# Patient Record
Sex: Female | Born: 1960
Health system: Southern US, Community
[De-identification: ages and names within clinical notes are randomized; demographics above are authoritative.]

## PROBLEM LIST (undated history)

## (undated) DIAGNOSIS — R51 Headache: Secondary | ICD-10-CM

## (undated) DIAGNOSIS — J189 Pneumonia, unspecified organism: Secondary | ICD-10-CM

## (undated) DIAGNOSIS — A159 Respiratory tuberculosis unspecified: Secondary | ICD-10-CM

## (undated) DIAGNOSIS — T7840XA Allergy, unspecified, initial encounter: Secondary | ICD-10-CM

## (undated) DIAGNOSIS — D126 Benign neoplasm of colon, unspecified: Secondary | ICD-10-CM

## (undated) DIAGNOSIS — R002 Palpitations: Secondary | ICD-10-CM

## (undated) DIAGNOSIS — K219 Gastro-esophageal reflux disease without esophagitis: Secondary | ICD-10-CM

## (undated) DIAGNOSIS — J45909 Unspecified asthma, uncomplicated: Secondary | ICD-10-CM

## (undated) HISTORY — PX: OVARIAN CYST REMOVAL: SHX89

## (undated) HISTORY — DX: Gastro-esophageal reflux disease without esophagitis: K21.9

## (undated) HISTORY — DX: Palpitations: R00.2

## (undated) HISTORY — DX: Respiratory tuberculosis unspecified: A15.9

## (undated) HISTORY — DX: Unspecified asthma, uncomplicated: J45.909

## (undated) HISTORY — PX: APPENDECTOMY: SHX54

## (undated) HISTORY — DX: Headache: R51

## (undated) HISTORY — PX: POLYPECTOMY: SHX149

## (undated) HISTORY — PX: COLONOSCOPY: SHX174

## (undated) HISTORY — DX: Benign neoplasm of colon, unspecified: D12.6

## (undated) HISTORY — DX: Allergy, unspecified, initial encounter: T78.40XA

## (undated) HISTORY — DX: Pneumonia, unspecified organism: J18.9

---

## 2003-06-26 ENCOUNTER — Other Ambulatory Visit: Admission: RE | Admit: 2003-06-26 | Discharge: 2003-06-26 | Payer: Self-pay | Admitting: Obstetrics and Gynecology

## 2004-06-20 ENCOUNTER — Emergency Department (HOSPITAL_COMMUNITY): Admission: EM | Admit: 2004-06-20 | Discharge: 2004-06-20 | Payer: Self-pay | Admitting: Emergency Medicine

## 2004-06-30 ENCOUNTER — Other Ambulatory Visit: Admission: RE | Admit: 2004-06-30 | Discharge: 2004-06-30 | Payer: Self-pay | Admitting: Obstetrics and Gynecology

## 2005-11-06 ENCOUNTER — Emergency Department (HOSPITAL_COMMUNITY): Admission: EM | Admit: 2005-11-06 | Discharge: 2005-11-06 | Payer: Self-pay | Admitting: Family Medicine

## 2006-11-15 ENCOUNTER — Ambulatory Visit: Payer: Self-pay | Admitting: Cardiology

## 2006-11-15 ENCOUNTER — Observation Stay (HOSPITAL_COMMUNITY): Admission: EM | Admit: 2006-11-15 | Discharge: 2006-11-16 | Payer: Self-pay | Admitting: Emergency Medicine

## 2008-10-09 ENCOUNTER — Encounter: Admission: RE | Admit: 2008-10-09 | Discharge: 2008-10-09 | Payer: Self-pay | Admitting: Obstetrics and Gynecology

## 2008-10-09 ENCOUNTER — Encounter (INDEPENDENT_AMBULATORY_CARE_PROVIDER_SITE_OTHER): Payer: Self-pay | Admitting: *Deleted

## 2009-06-30 ENCOUNTER — Encounter (INDEPENDENT_AMBULATORY_CARE_PROVIDER_SITE_OTHER): Payer: Self-pay | Admitting: *Deleted

## 2009-07-19 ENCOUNTER — Encounter: Payer: Self-pay | Admitting: Cardiology

## 2009-07-28 ENCOUNTER — Ambulatory Visit: Payer: Self-pay | Admitting: Gastroenterology

## 2009-07-28 DIAGNOSIS — J45909 Unspecified asthma, uncomplicated: Secondary | ICD-10-CM | POA: Insufficient documentation

## 2009-07-28 DIAGNOSIS — K5900 Constipation, unspecified: Secondary | ICD-10-CM | POA: Insufficient documentation

## 2009-07-28 DIAGNOSIS — K5909 Other constipation: Secondary | ICD-10-CM | POA: Insufficient documentation

## 2009-08-11 ENCOUNTER — Telehealth: Payer: Self-pay | Admitting: Gastroenterology

## 2009-08-31 ENCOUNTER — Ambulatory Visit: Payer: Self-pay | Admitting: Gastroenterology

## 2009-08-31 HISTORY — PX: COLONOSCOPY: SHX174

## 2009-11-05 ENCOUNTER — Telehealth: Payer: Self-pay | Admitting: Gastroenterology

## 2010-04-05 NOTE — Assessment & Plan Note (Signed)
Summary: constipation...em   History of Present Illness Visit Type: new patient  Primary GI MD: Melvia Heaps MD [facility] Primary Provider: Miguel Aschoff, MD  Requesting Provider: na  Chief Complaint: constipation History of Present Illness:   [name] is a pleasant 50 year old Hispanic female referred at the request of Dr. Tenny Craw for evaluation of constipation.  This has been an intermittent problem in the past.  It is worsening.  She may go 3-4 days without a bowel movement.  She has occasional lower abdominall pain.  She is without bleeding or melena.  Weight has been stable.  She is on a high fiber diet.   GI Review of Systems      Denies abdominal pain, acid reflux, belching, bloating, chest pain, dysphagia with liquids, dysphagia with solids, heartburn, loss of appetite, nausea, vomiting, vomiting blood, weight loss, and  weight gain.      Reports constipation.     Denies anal fissure, black tarry stools, change in bowel habit, diarrhea, diverticulosis, fecal incontinence, heme positive stool, hemorrhoids, irritable bowel syndrome, jaundice, light color stool, liver problems, rectal bleeding, and  rectal pain.    Current Medications (verified): 1)  Butalbital-Apap-Caffeine 50-325-40 Mg Tabs (Butalbital-Apap-Caffeine) .... Take One To Two Tablets By Mouth Every Six Hours As Needed For Pain 2)  Ibuprofen 200 Mg Tabs (Ibuprofen) .... As Needed 3)  Osteoporosis Support Therapy Pack Misc (Nutritional Supplements) .... Once Daily  Allergies (verified): No Known Drug Allergies  Past History:  Past Medical History: Asthma Pneumonia Chronic Headaches  Past Surgical History: Ovarian Cyst   Family History: No FH of Colon Cancer: Family History of Heart Disease: Father Family History of Clotting disorder: Father  Social History: Occupation: Conservation officer, nature Married No childern Patient has never smoked.  Alcohol Use - no Daily Caffeine Use: 1 daily Illicit Drug Use - no Smoking Status:   never Drug Use:  no  Review of Systems       The patient complains of change in vision, headaches-new, sleeping problems, and vision changes.  The patient denies allergy/sinus, anemia, anxiety-new, arthritis/joint pain, back pain, blood in urine, breast changes/lumps, confusion, cough, coughing up blood, depression-new, fainting, fatigue, fever, hearing problems, heart murmur, heart rhythm changes, itching, menstrual pain, muscle pains/cramps, night sweats, nosebleeds, pregnancy symptoms, shortness of breath, skin rash, sore throat, swelling of feet/legs, swollen lymph glands, thirst - excessive , urination - excessive , urination changes/pain, urine leakage, and voice change.         All other systems were reviewed and were negative   Vital Signs:  Patient profile:   50 year old patient Height:      62 inches Weight:      115 pounds BMI:     21.11 BSA:     1.51 Pulse rate:   60 / minute Pulse rhythm:   regular BP sitting:   94 / 52  (left arm) Cuff size:   regular  Vitals Entered By: Ok Anis CMA (Jul 28, 2009 11:04 AM)  Physical Exam  Additional Exam:  On physical exam she is a well-developed well-nourished female  skin: anicteric HEENT: normocephalic; PEERLA; no nasal or pharyngeal abnormalities neck: supple nodes: no cervical lymphadenopathy chest: clear to ausculatation and percussion heart: no murmurs, gallops, or rubs abd: soft, nontender; BS normoactive; no abdominal masses, tenderness, organomegaly rectal: deferred ext: no cynanosis, clubbing, edema skeletal: no deformities neuro: oriented x 3; no focal abnormalities    Impression & Recommendations:  Problem # 1:  OTHER CONSTIPATION (ICD-564.09) Constipation  is most likely functional.  Recommendations #1 fiber supplementation #2 screening colonoscopy  Risks, alternatives, and complications of the procedure, including bleeding, perforation, and possible need for surgery, were explained to the patient.   Patient's questions were answered.  Problem # 2:  ASTHMA, UNSPECIFIED, UNSPECIFIED STATUS (ICD-493.90) Assessment: Comment Only  Other Orders: Colonoscopy (Colon)  Patient Instructions: 1)  CC Dr. Donovan Kail 2)  Come for your procedure on 08/31/2009 on the 4th floor of the [facility].  3)  Your Moviprep has been sent to your pharm.  4)  Constipation and Hemorrhoids brochure given.  5)  Colonoscopy and Flexible Sigmoidoscopy brochure given.  6)  Conscious Sedation brochure given.  7)  The medication list was reviewed and reconciled.  All changed / newly prescribed medications were explained.  A complete medication list was provided to the patient / caregiver. Prescriptions: MOVIPREP 100 GM  SOLR (PEG-KCL-NACL-NASULF-NA ASC-C) As per prep instructions.  #1 x 0   Entered by:   Harlow Mares CMA (AAMA)   Authorized by:   Louis Meckel MD   Signed by:   Harlow Mares CMA (AAMA) on 07/28/2009   Method used:   Electronically to        [facility] [address] (retail)       [phone]       [place], [place]  [address]       Ph: [phone]       Fax: [phone]   RxID:   [id number] MOVIPREP 100 GM  SOLR (PEG-KCL-NACL-NASULF-NA ASC-C) As per prep instructions.  #1 x 0   Entered by:   Harlow Mares CMA (AAMA)   Authorized by:   Louis Meckel MD   Signed by:   Harlow Mares CMA (AAMA) on 07/28/2009   Method used:   Electronically to        [facility]  [address] [facility] [phone]* (retail)       [address]       [place], [place]  [address]       Ph: [phone] or [phone]       Fax: [phone]   RxID:   [id number]

## 2010-04-05 NOTE — Letter (Signed)
Summary: Results Letter  [facility]  [address] [place], [place] [address]   Phone: [phone]  Fax: [phone]        Jul 28, 2009 MRN: [id number]    [name] [address] [place], [place]  [address]    Dear [name],  It is my pleasure to have treated you recently as a new patient in my office. I appreciate your confidence and the opportunity to participate in your care.  Since I do have a busy inpatient endoscopy schedule and office schedule, my office hours vary weekly. I am, however, available for emergency calls everyday through my office. If I am not available for an urgent office appointment, another one of our gastroenterologist will be able to assist you.  My well-trained staff are prepared to help you at all times. For emergencies after office hours, a physician from our Gastroenterology section is always available through my 24 hour answering service  Once again I welcome you as a new patient and I look forward to a happy and healthy relationship             Sincerely,  Louis Meckel MD  This letter has been electronically signed by your physician.  Appended Document: Results Letter letter mailed

## 2010-04-05 NOTE — Procedures (Signed)
Summary: Colonoscopy  Patient: [name] Note: All result statuses are Final unless otherwise noted.  Tests: (1) Colonoscopy (COL)   COL Colonoscopy           DONE     [facility]     [address]     [place], [place]  [address]           COLONOSCOPY PROCEDURE REPORT           PATIENT:  [name], [name]  MR#:  [id number]     BIRTHDATE:  1961/02/20, 49 yrs. old  GENDER:  female           ENDOSCOPIST:  Barbette Hair. Arlyce Dice, MD     Referred by:           PROCEDURE DATE:  08/31/2009     PROCEDURE:  Diagnostic Colonoscopy     ASA CLASS:  Class I     INDICATIONS:  1) constipation           MEDICATIONS:   Fentanyl 50 mcg IV, Versed 6 mg IV           DESCRIPTION OF PROCEDURE:   After the risks benefits and     alternatives of the procedure were thoroughly explained, informed     consent was obtained.  Digital rectal exam was performed and     revealed no abnormalities.   The LB [id number]-[id number] endoscope     was introduced through the anus and advanced to the cecum, which     was identified by the ileocecal valve, without limitations.  The     quality of the prep was excellent, using MoviPrep.  The instrument     was then slowly withdrawn as the colon was fully examined.     <<PROCEDUREIMAGES>>           FINDINGS:  Diverticula were found in the transverse colon (see     image7). Few diverticula  This was otherwise a normal examination     of the colon (see image3, image4, image6, image8, image10,     image11, image14, and image15).   Retroflexed views in the rectum     revealed no abnormalities.    The time to cecum =  3.25  minutes.     The scope was then withdrawn (time =  6.0  min) from the patient     and the procedure completed.           COMPLICATIONS:  None           ENDOSCOPIC IMPRESSION:     1) Diverticula in the transverse colon     2) Otherwise normal examination     RECOMMENDATIONS:     1) high fiber diet; miralax as needed every 3 days     2) call  office next 1-3 days to schedule followup visit in 1     month           REPEAT EXAM:  In 10 year(s) for Colonoscopy.           ______________________________     Barbette Hair. Arlyce Dice, MD           CC: Duane Lope, MD           n.     Rosalie Doctor:   Barbette Hair. Kaenan Jake at 08/31/2009 02:17 PM           Burak, Douglas, [id number]  Note: An exclamation mark (!) indicates a result that was not  dispersed into the flowsheet. Document Creation Date: 08/31/2009 2:17 PM _______________________________________________________________________  (1) Order result status: Final Collection or observation date-time: 08/31/2009 14:10 Requested date-time:  Receipt date-time:  Reported date-time:  Referring Physician:   Ordering Physician: Melvia Heaps [phone]) Specimen Source:  Source: Launa Grill Order Number: [id number] Lab site:   Appended Document: Colonoscopy     Procedures Next Due Date:    Colonoscopy: 08/2016

## 2010-04-05 NOTE — Letter (Signed)
Summary: [facility] [place] Instructions  [facility]  [address] [place], [place] [address]   Phone: [phone]  Fax: [phone]       [name]    Oct 01, 1960    MRN: [id number]        Procedure Day /Date: 08/31/2009 Tuesday     Arrival Time: 12:30pm     Procedure Time: 1:30pm     Location of Procedure:                    [facility] (4th Floor)   PREPARATION FOR COLONOSCOPY WITH MOVIPREP   Starting 5 days prior to your procedure 08/27/2009 do not eat nuts, seeds, popcorn, corn, beans, peas,  salads, or any raw vegetables.  Do not take any fiber supplements (e.g. Metamucil, Citrucel, and Benefiber).  THE DAY BEFORE YOUR PROCEDURE         DATE: 08/30/2009  DAY: Monday  1.  Drink clear liquids the entire day-NO SOLID FOOD  2.  Do not drink anything colored red or purple.  Avoid juices with pulp.  No orange juice.  3.  Drink at least 64 oz. (8 glasses) of fluid/clear liquids during the day to prevent dehydration and help the prep work efficiently.  CLEAR LIQUIDS INCLUDE: Water Jello Ice Popsicles Tea (sugar ok, no milk/cream) Powdered fruit flavored drinks Coffee (sugar ok, no milk/cream) Gatorade Juice: apple, white grape, white cranberry  Lemonade Clear bullion, consomm, broth Carbonated beverages (any kind) Strained chicken noodle soup Hard Candy                             4.  In the morning, mix first dose of MoviPrep solution:    Empty 1 Pouch A and 1 Pouch B into the disposable container    Add lukewarm drinking water to the top line of the container. Mix to dissolve    Refrigerate (mixed solution should be used within 24 hrs)  5.  Begin drinking the prep at 5:00 p.m. The MoviPrep container is divided by 4 marks.   Every 15 minutes drink the solution down to the next mark (approximately 8 oz) until the full liter is complete.   6.  Follow completed prep with 16 oz of clear liquid of your choice (Nothing red or purple).   Continue to drink clear liquids until bedtime.  7.  Before going to bed, mix second dose of MoviPrep solution:    Empty 1 Pouch A and 1 Pouch B into the disposable container    Add lukewarm drinking water to the top line of the container. Mix to dissolve    Refrigerate  THE DAY OF YOUR PROCEDURE      DATE:  08/31/2009  DAY: Tuesday  Beginning at 8:30a.m. (5 hours before procedure):         1. Every 15 minutes, drink the solution down to the next mark (approx 8 oz) until the full liter is complete.  2. Follow completed prep with 16 oz. of clear liquid of your choice.    3. You may drink clear liquids until 11:30am (2 HOURS BEFORE PROCEDURE).   MEDICATION INSTRUCTIONS  Unless otherwise instructed, you should take regular prescription medications with a small sip of water   as early as possible the morning of your procedure.          OTHER INSTRUCTIONS  You will need a responsible adult at least 50 years of  age to accompany you and drive you home.   This person must remain in the waiting room during your procedure.  Wear loose fitting clothing that is easily removed.  Leave jewelry and other valuables at home.  However, you may wish to bring a book to read or  an iPod/MP3 player to listen to music as you wait for your procedure to start.  Remove all body piercing jewelry and leave at home.  Total time from sign-in until discharge is approximately 2-3 hours.  You should go home directly after your procedure and rest.  You can resume normal activities the  day after your procedure.  The day of your procedure you should not:   Drive   Make legal decisions   Operate machinery   Drink alcohol   Return to work  You will receive specific instructions about eating, activities and medications before you leave.    The above instructions have been reviewed and explained to me by   _______________________    I fully understand and can verbalize these instructions  _____________________________ Date _________

## 2010-04-05 NOTE — Progress Notes (Signed)
Summary: Triage-REV  Phone Note Call from Patient Call back at [phone]   Caller: spouse ronnie Call For: Dr Arlyce Dice Reason for Call: Talk to Nurse Summary of Call: Husband cx wife appt for Tues. and was wanting to know if he has to reschedule or can she just f.u as needed. Initial call taken by: Tawni Levy,  November 05, 2009 1:23 PM  Follow-up for Phone Call        Message left: Per Dr.Talib Headley/Colonoscopy report on 08-31-09, pt. was to follow-up with Dr.Jazminn Pomales in the office, in 1 month. Please call [phone] to reschedule appt.  Follow-up by: Laureen Ochs LPN,  November 05, 2009 2:10 PM

## 2010-04-05 NOTE — Progress Notes (Signed)
Summary: Rescheduled colon  Phone Note Call from Patient   Caller: Patient Call For: Dr. Arlyce Dice Summary of Call: Pt r/s her COL from 08-13-09 to 08-31-09 due to conflict w/work sch'd and cannot get off. Would you like pt. charged the cancelation fee? Initial call taken by: Karna Christmas,  August 11, 2009 9:45 AM  Follow-up for Phone Call        no Follow-up by: Louis Meckel MD,  August 11, 2009 10:52 AM

## 2010-04-05 NOTE — Letter (Signed)
Summary: New Patient letter  [facility]  [address] [place], [place] [address]   Phone: [phone]  Fax: [phone]       06/30/2009 MRN: [id number]  [name] [address] [place], [place]  [address]  Dear [name],  Welcome to the Gastroenterology Division at [facility].    You are scheduled to see Dr.  Stan Head on August 04, 2009 at 1:45pm on the 3rd floor at [facility], [address]  We ask that you try to arrive at our office 15 minutes prior to your appointment time to allow for check-in.  We would like you to complete the enclosed self-administered evaluation form prior to your visit and bring it with you on the day of your appointment.  We will review it with you.  Also, please bring a complete list of all your medications or, if you prefer, bring the medication bottles and we will list them.  Please bring your insurance card so that we may make a copy of it.  If your insurance requires a referral to see a specialist, please bring your referral form from your primary care physician.  Co-payments are due at the time of your visit and may be paid by cash, check or credit card.     Your office visit will consist of a consult with your physician (includes a physical exam), any laboratory testing he/she may order, scheduling of any necessary diagnostic testing (e.g. x-ray, ultrasound, CT-scan), and scheduling of a procedure (e.g. Endoscopy, Colonoscopy) if required.  Please allow enough time on your schedule to allow for any/all of these possibilities.    If you cannot keep your appointment, please call [phone] to cancel or reschedule prior to your appointment date.  This allows [place] the opportunity to schedule an appointment for another patient in need of care.  If you do not cancel or reschedule by 5 p.m. the business day prior to your appointment date, you will be charged a $50.00 late cancellation/no-show fee.    Thank you for  choosing [facility] for your medical needs.  We appreciate the opportunity to care for you.  Please visit [place] at our website  to learn more about our practice.                     Sincerely,                                                             The Gastroenterology Division

## 2010-07-19 NOTE — H&P (Signed)
[name], [name]               ACCOUNT NO.:  [id number]   MEDICAL RECORD NO.:  [id number]          PATIENT TYPE:  EMS   LOCATION:  [facility]                         FACILITY:  [facility]   PHYSICIAN:  Hollice Espy, M.D.DATE OF BIRTH:  05/28/1960   DATE OF ADMISSION:  11/15/2006  DATE OF DISCHARGE:                              HISTORY & PHYSICAL   PRIMARY CARE [place]:  Corky Mull, PA.   GYNECOLOGIST:  Miguel Aschoff, M.D.   REASON FOR ADMISSION:  Chest pain.   HISTORY OF PRESENT ILLNESS:  [name] is a delightful 50 year old  woman from [place] with a history of previous tuberculosis with  extensive right upper lobe scarring.  Otherwise, her past medical  history is unremarkable except for headaches.  She denies any previous  history of known coronary artery disease.  She has never had a stress  test or cardiac catheterization.  There is no history of hypertension,  hyperlipidemia, diabetes, or tobacco use.  She presents today with a 3-  day history of chest pain.  She said this began Tuesday, when she had  some pain in her left arm while visiting her in-laws.  This radiated to  her chest.  It lasted about an hour with no associated symptoms.  Yesterday she felt weak, she had occasional chest pain.  She felt it was  slightly harder to take a breath.  But this did not effect her exercise  tolerance in any way.  This morning again she woke up with a headache  and she again had some mild chest pain without any associated symptoms.  Once again, it was not worse with exertion.  She feels better now.  She  has planned on going to [place] for a month next week.   In the emergency room, her EKG is normal without any evidence of ST-T  wave changes.  Point-of-care markers are negative x1.   REVIEW OF SYSTEMS:  As baseline she is very active without any  exertional symptoms.  She has had a mild cough but this has been  nonproductive.  No fevers, no chills, no heart failure, no bright red  blood per rectum, no melena.  Perimenopausal.  The remainder of the  review of systems is negative except for HPI and problem list.   PROBLEM LIST:  1. Headaches.  2. History of a previous pulmonary tuberculosis with extensive right      upper lobe scarring which is stable.   CURRENT MEDICATIONS:  Prilosec 20 a day.   ALLERGIES:  No known drug allergies.   SOCIAL HISTORY:  She lives with her husband.  No kids.  She is  originally from [place].  She currently works as a Conservation officer, nature.  She  denies any tobacco or alcohol.  She is perimenopausal.   FAMILY HISTORY:  Mother is 47, has a history of hypertension.  Father is  13 with a history of arthritis.  There is no family history of premature  heart disease.   PHYSICAL EXAMINATION:  GENERAL:  She is lying flat in bed in no acute  distress.  Respirations are  unlabored.  VITAL SIGNS:  Blood pressure is 102/66, heart rate is 80.  She is sating  at 95% on room air.  Temperature is 99.3.  HEENT:  Normal.  NECK:  Supple.  There is no JVD.  Carotids are 2 plus bilaterally  without any bruits.  There is no lymphadenopathy or thyromegaly.  CARDIAC:  PMI is nondisplaced.  She has a regular rate and rhythm with  no murmurs, rubs, or gallops.  LUNGS:  Clear.  There are some mild crackles in the right upper lobe.  ABDOMEN:  Soft, nontender, nondistended.  There is no  hepatosplenomegaly.  No bruits.  No masses.  Good bowel sounds.  EXTREMITIES:  Warm with no cyanosis, clubbing, or edema.  There are no  cords.  Distal pulses are strong.  No rash.  NEUROLOGIC:  Alert and oriented x3.  Cranial nerves II-XII are intact.  She moves all 4 extremities without difficulty.  Affect is very  pleasant.  Oriented x3.   EKG shows a normal sinus rhythm, rate of 61, normal axis and intervals.  There are no ST-T wave abnormalities.  Cardiac markers, 2 sets actually,  have a CK-MB of less than 1 and troponin of less than 0.05.  A D-dimer  is less than 0.22.   I-STAT shows a hemoglobin of 13.9.  Sodium 130,  potassium 3.4, chloride 107, glucose 88, BUN 10, creatinine is 0.6.   ASSESSMENT:  1. Atypical chest pain.  2. History of pulmonary tuberculosis.   PLAN/DISCUSSION:  Her chest pain is very atypical.  I doubt this is  ischemia.  We will plan a 23-hour observation on telemetry to rule out  myocardial infarction.  I would suggest a treadmill Myoview to further  evaluate and given the fact that she is going out of the country next  week, it may be best just to do it prior to her discharge.      Bevelyn Buckles. Bensimhon, MD  Electronically Signed      Hollice Espy, M.D.  Electronically Signed    DRB/MEDQ  D:  11/15/2006  T:  11/15/2006  Job:  [id number]   cc:   Corky Mull, PA  Miguel Aschoff, M.D.

## 2010-12-16 LAB — COMPREHENSIVE METABOLIC PANEL
AST: 15
Alkaline Phosphatase: 65
BUN: 9
CO2: 25
Calcium: 8.3 — ABNORMAL LOW
Chloride: 108
GFR calc Af Amer: >60
Sodium: 136
Total Protein: 6.1

## 2010-12-16 LAB — I-STAT 8, (EC8 V) (CONVERTED LAB)
Acid-base deficit: 2
Bicarbonate: 25.7 — ABNORMAL HIGH
Glucose, Bld: 88
HCT: 41
Potassium: 3.4 — ABNORMAL LOW
TCO2: 27

## 2010-12-16 LAB — CARDIAC PANEL(CRET KIN+CKTOT+MB+TROPI)
CK, MB: 0.8
Relative Index: INVALID
Total CK: 46
Troponin I: 0.01
Troponin I: 0.04

## 2010-12-16 LAB — CBC
HCT: 33.7 — ABNORMAL LOW
Hemoglobin: 11.4 — ABNORMAL LOW
MCHC: 33.8
MCV: 91.1
Platelets: 243
RDW: 12.6

## 2010-12-16 LAB — POCT I-STAT CREATININE
Creatinine, Ser: 0.6
Operator id: 294501

## 2010-12-16 LAB — PROTIME-INR: Prothrombin Time: 12.8

## 2010-12-16 LAB — POCT PREGNANCY, URINE: Preg Test, Ur: NEGATIVE

## 2010-12-16 LAB — CK TOTAL AND CKMB (NOT AT ARMC)
Relative Index: INVALID
Total CK: 44

## 2010-12-16 LAB — LIPID PANEL
Cholesterol: 166
HDL: 49
Triglycerides: 80
VLDL: 16

## 2010-12-16 LAB — POCT CARDIAC MARKERS
Myoglobin, poc: 42.6
Operator id: 294501
Operator id: 294501
Troponin i, poc: <0.05

## 2010-12-16 LAB — TSH: TSH: 1.618

## 2011-04-07 ENCOUNTER — Emergency Department (HOSPITAL_COMMUNITY)
Admission: EM | Admit: 2011-04-07 | Discharge: 2011-04-07 | Disposition: A | Discharge status: Home / Self Care | Payer: 59 | Attending: Emergency Medicine | Admitting: Emergency Medicine

## 2011-04-07 ENCOUNTER — Encounter (HOSPITAL_COMMUNITY): Payer: Self-pay | Admitting: Emergency Medicine

## 2011-04-07 ENCOUNTER — Emergency Department (HOSPITAL_COMMUNITY): Payer: 59

## 2011-04-07 DIAGNOSIS — M79609 Pain in unspecified limb: Secondary | ICD-10-CM | POA: Insufficient documentation

## 2011-04-07 DIAGNOSIS — L819 Disorder of pigmentation, unspecified: Secondary | ICD-10-CM

## 2011-04-07 DIAGNOSIS — M7989 Other specified soft tissue disorders: Secondary | ICD-10-CM | POA: Insufficient documentation

## 2011-04-07 DIAGNOSIS — M25449 Effusion, unspecified hand: Secondary | ICD-10-CM | POA: Insufficient documentation

## 2011-04-07 LAB — DIFFERENTIAL
Basophils Relative: 1 % (ref 0–1)
Eosinophils Absolute: 0.8 10*3/uL — ABNORMAL HIGH (ref 0.0–0.7)
Eosinophils Relative: 11 % — ABNORMAL HIGH (ref 0–5)
Monocytes Relative: 6 % (ref 3–12)
Neutrophils Relative %: 47 % (ref 43–77)

## 2011-04-07 LAB — CBC
MCH: 30.6 pg (ref 26.0–34.0)
MCHC: 34.3 g/dL (ref 30.0–36.0)
MCV: 89.4 fL (ref 78.0–100.0)
Platelets: 231 10*3/uL (ref 150–400)

## 2011-04-07 LAB — PROTIME-INR: Prothrombin Time: 12.5 seconds (ref 11.6–15.2)

## 2011-04-07 LAB — APTT: aPTT: 29 seconds (ref 24–37)

## 2011-04-07 NOTE — ED Notes (Signed)
PT. REPORTS RIGHT THUMB PAIN  WITH SWELLING ONSET YESTERDAY , DENIES INJURY.

## 2011-04-07 NOTE — ED Provider Notes (Signed)
Medical screening examination/treatment/procedure(s) were performed by non-physician practitioner and as supervising physician I was immediately available for consultation/collaboration.  Jasmine Awe, MD 04/07/11 [phone]

## 2011-04-07 NOTE — ED Provider Notes (Signed)
History     CSN: [id number]  Arrival date & time 04/07/11  4098   First MD Initiated Contact with Patient 04/07/11 0127      Chief Complaint  Patient presents with  . Finger Injury    (Consider location/radiation/quality/duration/timing/severity/associated sxs/prior treatment) HPI Comments: Patient here with husband who states that the patient was cleaning the floor yesterday and when she took off her gloves she noticed that her right thumb was swollen and now turning purple - states no pain and no known injury - today the swelling is decreased but the discoloration is still present - continues with no pain to the area, no other fingers with swelling or discoloration.  Patient is a 51 y.o. female presenting with hand pain. The history is provided by the patient and the spouse. No language interpreter was used.  Hand Pain This is a new problem. The current episode started yesterday. The problem has been unchanged. Associated symptoms include joint swelling. Pertinent negatives include no abdominal pain, anorexia, arthralgias, change in bowel habit, chest pain, congestion, coughing, diaphoresis, fatigue, fever, headaches, myalgias, nausea, neck pain, numbness, rash, sore throat, vertigo, visual change, vomiting or weakness. The symptoms are aggravated by nothing. She has tried nothing for the symptoms. The treatment provided no relief.    History reviewed. No pertinent past medical history.  Past Surgical History  Procedure Date  . Cesarean section     No family history on file.  History  Substance Use Topics  . Smoking status: Never Smoker   . Smokeless tobacco: Not on file  . Alcohol Use: No    OB History    Grav Para Term Preterm Abortions TAB SAB Ect Mult Living                  Review of Systems  Constitutional: Negative for fever, diaphoresis and fatigue.  HENT: Negative for congestion, sore throat and neck pain.   Respiratory: Negative for cough.   Cardiovascular:  Negative for chest pain.  Gastrointestinal: Negative for nausea, vomiting, abdominal pain, anorexia and change in bowel habit.  Musculoskeletal: Positive for joint swelling. Negative for myalgias and arthralgias.  Skin: Negative for rash.  Neurological: Negative for vertigo, weakness, numbness and headaches.  All other systems reviewed and are negative.    Allergies  Review of patient's allergies indicates no known allergies.  Home Medications   Current Outpatient Rx  Name Route Sig Dispense Refill  . OMEGA-3 FATTY ACIDS 1000 MG PO CAPS Oral Take 1 g by mouth daily.    . IBUPROFEN 200 MG PO TABS Oral Take 400 mg by mouth once as needed. For headaches.      BP 100/61  Pulse 65  Temp(Src) 97.7 F (36.5 C) (Oral)  Resp 20  SpO2 100%  Physical Exam  Nursing note and vitals reviewed. Constitutional: She is oriented to person, place, and time. She appears well-developed and well-nourished. No distress.  HENT:  Head: Normocephalic and atraumatic.  Right Ear: External ear normal.  Left Ear: External ear normal.  Nose: Nose normal.  Mouth/Throat: Oropharynx is clear and moist. No oropharyngeal exudate.  Eyes: Conjunctivae are normal. Pupils are equal, round, and reactive to light. No scleral icterus.  Neck: Normal range of motion. Neck supple.  Cardiovascular: Normal rate, regular rhythm and normal heart sounds.  Exam reveals no gallop and no friction rub.   No murmur heard. Pulmonary/Chest: Effort normal and breath sounds normal. No respiratory distress. She exhibits no tenderness.  Abdominal: Soft. Bowel  sounds are normal. She exhibits no distension.  Musculoskeletal: Normal range of motion. She exhibits no edema and no tenderness.  Lymphadenopathy:    She has no cervical adenopathy.  Neurological: She is alert and oriented to person, place, and time. No cranial nerve deficit.  Skin: Skin is warm and dry.       Mild discoloration of the entire right thumb which has the  appearance of a bruise, no tenderness to palpation.  Psychiatric: She has a normal mood and affect. Her behavior is normal. Judgment and thought content normal.    ED Course  Procedures (including critical care time)  Labs Reviewed  CBC - Abnormal; Notable for the following:    RBC 3.85 (*)    Hemoglobin 11.8 (*)    HCT 34.4 (*)    All other components within normal limits  DIFFERENTIAL - Abnormal; Notable for the following:    Eosinophils Relative 11 (*)    Eosinophils Absolute 0.8 (*)    All other components within normal limits  APTT  PROTIME-INR   Dg Finger Thumb Right  04/07/2011  *RADIOLOGY REPORT*  Clinical Data: Right thumb injury  RIGHT THUMB 2+V  Comparison: None  Findings: Osseous mineralization normal. Joint spaces preserved. No acute fracture, dislocation or bone destruction.  IMPRESSION: Normal exam.  Original Report Authenticated By: Lollie Marrow, M.D.     1. Discoloration of skin       MDM  Patient with no tenderness and edema of the finger so I do not suspect cellulitis, felon or paronychia, x-ray is negative for fracture and PT, PTT and platelets are all normal.  I also doubt Raynaulds but this could be a possibility, I have encouraged the patient to follow up with their PCP for further evaluation if the discoloration continues.        Izola Price [place], [place] 04/07/11 [phone]

## 2011-06-08 ENCOUNTER — Encounter: Payer: Self-pay | Admitting: *Deleted

## 2011-09-26 ENCOUNTER — Other Ambulatory Visit: Payer: Self-pay | Admitting: Obstetrics and Gynecology

## 2011-09-26 DIAGNOSIS — R928 Other abnormal and inconclusive findings on diagnostic imaging of breast: Secondary | ICD-10-CM

## 2011-10-04 ENCOUNTER — Ambulatory Visit
Admission: RE | Admit: 2011-10-04 | Discharge: 2011-10-04 | Disposition: A | Discharge status: Home / Self Care | Payer: 59 | Source: Ambulatory Visit | Attending: Obstetrics and Gynecology | Admitting: Obstetrics and Gynecology

## 2011-10-04 ENCOUNTER — Other Ambulatory Visit: Payer: 59

## 2011-10-04 DIAGNOSIS — R928 Other abnormal and inconclusive findings on diagnostic imaging of breast: Secondary | ICD-10-CM

## 2012-09-30 ENCOUNTER — Other Ambulatory Visit: Payer: Self-pay | Admitting: Obstetrics and Gynecology

## 2012-09-30 DIAGNOSIS — R928 Other abnormal and inconclusive findings on diagnostic imaging of breast: Secondary | ICD-10-CM

## 2012-10-17 ENCOUNTER — Ambulatory Visit
Admission: RE | Admit: 2012-10-17 | Discharge: 2012-10-17 | Disposition: A | Discharge status: Home / Self Care | Payer: 59 | Source: Ambulatory Visit | Attending: Obstetrics and Gynecology | Admitting: Obstetrics and Gynecology

## 2012-10-17 DIAGNOSIS — R928 Other abnormal and inconclusive findings on diagnostic imaging of breast: Secondary | ICD-10-CM

## 2012-12-13 ENCOUNTER — Other Ambulatory Visit: Payer: Self-pay | Admitting: Orthopaedic Surgery

## 2012-12-13 DIAGNOSIS — M5030 Other cervical disc degeneration, unspecified cervical region: Secondary | ICD-10-CM

## 2012-12-13 DIAGNOSIS — M542 Cervicalgia: Secondary | ICD-10-CM

## 2012-12-20 ENCOUNTER — Ambulatory Visit
Admission: RE | Admit: 2012-12-20 | Discharge: 2012-12-20 | Disposition: A | Discharge status: Home / Self Care | Payer: 59 | Source: Ambulatory Visit | Attending: Orthopaedic Surgery | Admitting: Orthopaedic Surgery

## 2012-12-20 DIAGNOSIS — M542 Cervicalgia: Secondary | ICD-10-CM

## 2012-12-20 DIAGNOSIS — M5030 Other cervical disc degeneration, unspecified cervical region: Secondary | ICD-10-CM

## 2012-12-31 ENCOUNTER — Ambulatory Visit (INDEPENDENT_AMBULATORY_CARE_PROVIDER_SITE_OTHER): Payer: 59 | Admitting: Emergency Medicine

## 2012-12-31 VITALS — BP 98/60 | HR 60 | Temp 97.6°F | Resp 18 | Ht 60.0 in | Wt 116.0 lb

## 2012-12-31 DIAGNOSIS — Z0000 Encounter for general adult medical examination without abnormal findings: Secondary | ICD-10-CM

## 2012-12-31 DIAGNOSIS — G44209 Tension-type headache, unspecified, not intractable: Secondary | ICD-10-CM

## 2012-12-31 LAB — COMPREHENSIVE METABOLIC PANEL
ALT: 9 U/L (ref 0–35)
AST: 16 U/L (ref 0–37)
Albumin: 4.3 g/dL (ref 3.5–5.2)
Alkaline Phosphatase: 79 U/L (ref 39–117)
BUN: 13 mg/dL (ref 6–23)
Calcium: 9.3 mg/dL (ref 8.4–10.5)
Chloride: 106 mEq/L (ref 96–112)
Potassium: 4.3 mEq/L (ref 3.5–5.3)
Sodium: 139 mEq/L (ref 135–145)

## 2012-12-31 LAB — POCT CBC
Granulocyte percent: 49.4 %G (ref 37–80)
HCT, POC: 39.9 % (ref 37.7–47.9)
Lymph, poc: 2.1 (ref 0.6–3.4)
MCHC: 31.1 g/dL — AB (ref 31.8–35.4)
MPV: 9.4 fL (ref 0–99.8)
POC Granulocyte: 2.5 (ref 2–6.9)
POC LYMPH PERCENT: 41.1 %L (ref 10–50)
POC MID %: 9.5 %M (ref 0–12)
RDW, POC: 13.4 %

## 2012-12-31 LAB — POCT URINALYSIS DIPSTICK
Bilirubin, UA: NEGATIVE
Glucose, UA: NEGATIVE
Nitrite, UA: NEGATIVE
Urobilinogen, UA: 0.2

## 2012-12-31 LAB — POCT UA - MICROSCOPIC ONLY
Casts, Ur, LPF, POC: NEGATIVE
Mucus, UA: NEGATIVE
Yeast, UA: NEGATIVE

## 2012-12-31 LAB — LIPID PANEL
HDL: 64 mg/dL (ref >39)
LDL Cholesterol: 139 mg/dL — ABNORMAL HIGH (ref 0–99)

## 2012-12-31 LAB — IFOBT (OCCULT BLOOD): IFOBT: NEGATIVE

## 2012-12-31 LAB — VITAMIN D 25 HYDROXY (VIT D DEFICIENCY, FRACTURES): Vit D, 25-Hydroxy: 22 ng/mL — ABNORMAL LOW (ref 30–89)

## 2012-12-31 LAB — TSH: TSH: 2.035 u[IU]/mL (ref 0.350–4.500)

## 2012-12-31 MED ORDER — BUTALBITAL-APAP-CAFFEINE 50-325-40 MG PO TABS
1.0000 | ORAL_TABLET | Freq: Four times a day (QID) | ORAL | Status: AC | PRN
Start: 2012-12-31 — End: 2013-12-31

## 2012-12-31 NOTE — Patient Instructions (Signed)
Constipación - Adulto   (Constipation, Adult)   Constipación significa que una persona tiene menos de 3 evacuaciones en una semana, hay dificultad para evacuar el intestino, o las heces son secas, duras, o más grandes que lo normal. A medida que envejecemos el estreñimiento es más común. Si intenta curar el estreñimiento con medicamentos que producen la evacuación intestinal (laxantes), el problema puede empeorar. El uso prolongado de laxantes puede hacer que los músculos del colon se debiliten. Una dieta baja en fibra, no tomar suficientes líquidos y el uso de ciertos medicamentos pueden empeorar el estreñimiento.   CAUSAS   · Ciertos medicamentos, como los antidepresivos, analgésicos, suplementos de hierro, antiácidos y diuréticos.    · Algunas enfermedades, como la diabetes, el síndrome del colon irritable (SII), enfermedad de la tiroides, o depresión.    · No beber suficiente agua.    · No consumir suficientes alimentos ricos en fibra.    · Situaciones de estrés o viajes.  · Falta de actividad física o de ejercicio.  · No ir al baño cuando siente la necesidad.  · Ignorar la necesidad súbita de mover el intestino.  · Uso en exceso de laxantes.  SÍNTOMAS   · Evacuar el intestino menos de 3 veces a la semana.    · Dificultad para mover el intestino    · Tener las heces secas y duras, o más grandes que las normales.    · Sensación de estar lleno o distendido.    · Dolor en la parte baja del abdomen  · No se siente alivio después de evacuar el intestino.  DIAGNÓSTICO   El médico le hará una historia clínica y le hará un examen físico. Pueden hacerle exámenes adicionales para el estreñimiento grave. Algunas pruebas son:   · Un radiografía con enema de bario para examinar el recto, el colon y en algunos casos el intestino delgado.  · Una sigmoidoscopia para examinar el colon inferior.  · Una colonoscopia para examinar todo el colon.  TRATAMIENTO   El tratamiento dependerá de la gravedad de la constipación y de la  causa. Algunos tratamientos dietéticos son beber más líquidos y comer más alimentos ricos en fibra. El cambio en el estilo de vida incluye hacer ejercicios de manera regular. Si estas recomendaciones para realizar cambios en la dieta y en el estilo de vida no ayudan, el médico le puede indicar el uso de laxantes de venta libre para favorecer el movimiento intestinal. Los medicamentos con receta se pueden prescribir si los medicamentos de venta libre no lo mejoran.   INSTRUCCIONES PARA EL CUIDADO EN EL HOGAR   · Aumente el consumo de alimentos con fibra, como frutas, verduras, granos enteros y frijoles. Limite los azúcares ricos en grasas y procesados   en su dieta, tales como papas fritas, hamburguesas, galletas, dulces y refrescos.    · Puede agregar un suplemento de fibra a su dieta si no obtiene lo suficiente de los alimentos.    · Debe ingerir gran cantidad de líquido para mantener la orina de tono claro o color amarillo pálido.    · Haga ejercicios regularmente o según las indicaciones de su médico.    · Vaya al baño cuando sienta la necesidad de ir. No espere.  · Tome sólo la medicación que le indicó el profesional.  No tome otros medicamentos para la constipación sin consultar a su médico.  SOLICITE ATENCIÓN MÉDICA DE INMEDIATO SI:   · Observa sangre brillante en las heces.    · La constipación dura más de 4 días o empeora.    · Siente   dolor abdominal o rectal.    · Las heces son delgadas como un lápiz.  · Pierde peso de manera inexplicable.  ASEGÚRESE DE QUE:   · Comprende estas instrucciones.  · Controlará su enfermedad.  · Solicitará ayuda de inmediato si no mejora o empeora.  Document Released: 03/12/2007 Document Revised: 08/22/2011  [employer]® Patient Information ©2014 [employer].

## 2012-12-31 NOTE — Progress Notes (Signed)
[facility] [address], [place] [address] [phone]  Date:  12/31/2012   Name:  [name]   DOB:  1960/11/03   MRN:  [id number]  PCP:  Katy Apo, MD    Chief Complaint: Annual Exam   History of Present Illness:  [name] is a 52 y.o. very pleasant female patient who presents with the following:  Had GYN and mammogram in July.  Had colonoscopy 4 years ago.  Was normal except for diverticulosis.  Has chronic constipation.  No blood in stool or stool change.  Frequent headaches that arise in the back of her neck and over the top of her head.  No neuro or visual symptoms.    No improvement with over the counter medications or other home remedies. Denies other complaint or health concern today.   Patient Active Problem List   Diagnosis Date Noted  . ASTHMA, UNSPECIFIED, UNSPECIFIED STATUS 07/28/2009  . CONSTIPATION 07/28/2009  . OTHER CONSTIPATION 07/28/2009    Past Medical History  Diagnosis Date  . Palpitations   . Headache(784.0)   . Asthma     Past Surgical History  Procedure Laterality Date  . Cesarean section    . Ovarian cyst removal    . Polypectomy      History  Substance Use Topics  . Smoking status: Never Smoker   . Smokeless tobacco: Not on file  . Alcohol Use: 0.0 oz/week     Comment: occasional    Family History  Problem Relation Age of Onset  . Heart disease    . Hypertension    . Hyperlipidemia    . Hypertension Mother   . Diabetes Paternal Grandfather     Allergies  Allergen Reactions  . Gabapentin     Caused headaches and dizziness   . Meloxicam     Caused headaches and dizziness    Medication list has been reviewed and updated.  Current Outpatient Prescriptions on File Prior to Visit  Medication Sig Dispense Refill  . fish oil-omega-3 fatty acids 1000 MG capsule Take 1 g by mouth daily.      Marland Kitchen ibuprofen (ADVIL,MOTRIN) 200 MG tablet Take 400 mg by mouth once as needed. For headaches.       . Multiple Vitamin (MULTIVITAMIN) tablet Take 1 tablet by mouth daily.       No current facility-administered medications on file prior to visit.    Review of Systems:  As per HPI, otherwise negative.    Physical Examination: Filed Vitals:   12/31/12 0927  BP: 98/60  Pulse: 60  Temp: 97.6 F (36.4 C)  Resp: 18   Filed Vitals:   12/31/12 0927  Height: 5' (1.524 m)  Weight: 116 lb (52.617 kg)   Body mass index is 22.65 kg/(m^2). Ideal Body Weight: Weight in (lb) to have BMI = 25: 127.7  GEN: WDWN, NAD, Non-toxic, A & O x 3 HEENT: Atraumatic, Normocephalic. Neck supple. No masses, No LAD. Ears and Nose: No external deformity. CV: RRR, No M/G/R. No JVD. No thrill. No extra heart sounds. PULM: CTA B, no wheezes, crackles, rhonchi. No retractions. No resp. distress. No accessory muscle use. ABD: S, NT, ND, +BS. No rebound. No HSM. EXTR: No c/c/e NEURO Normal gait.  PSYCH: Normally interactive. Conversant. Not depressed or anxious appearing.  Calm demeanor.  DRE:  normal  Assessment and Plan: Wellness examination Tension headaches  Signed,  Phillips Odor, MD

## 2012-12-31 NOTE — Addendum Note (Signed)
Addended by: Vira Agar on: 12/31/2012 10:25 AM   Modules accepted: Orders

## 2013-01-09 ENCOUNTER — Other Ambulatory Visit: Payer: Self-pay

## 2013-01-20 ENCOUNTER — Telehealth: Payer: Self-pay

## 2013-01-21 NOTE — Progress Notes (Signed)
Completed biometric wellness form for pt and called husband as requested to notify ready for p/up. Copy scanned.

## 2014-02-17 ENCOUNTER — Ambulatory Visit (INDEPENDENT_AMBULATORY_CARE_PROVIDER_SITE_OTHER): Payer: 59 | Admitting: Physician Assistant

## 2014-02-17 VITALS — BP 96/65 | HR 75 | Temp 97.7°F | Resp 16 | Ht 61.0 in | Wt 118.0 lb

## 2014-02-17 DIAGNOSIS — Z0000 Encounter for general adult medical examination without abnormal findings: Secondary | ICD-10-CM

## 2014-02-17 DIAGNOSIS — D509 Iron deficiency anemia, unspecified: Secondary | ICD-10-CM

## 2014-02-17 DIAGNOSIS — Z1329 Encounter for screening for other suspected endocrine disorder: Secondary | ICD-10-CM

## 2014-02-17 DIAGNOSIS — Z13228 Encounter for screening for other metabolic disorders: Secondary | ICD-10-CM

## 2014-02-17 DIAGNOSIS — R319 Hematuria, unspecified: Secondary | ICD-10-CM

## 2014-02-17 DIAGNOSIS — K5900 Constipation, unspecified: Secondary | ICD-10-CM

## 2014-02-17 DIAGNOSIS — Z131 Encounter for screening for diabetes mellitus: Secondary | ICD-10-CM

## 2014-02-17 DIAGNOSIS — Z13220 Encounter for screening for lipoid disorders: Secondary | ICD-10-CM

## 2014-02-17 DIAGNOSIS — R079 Chest pain, unspecified: Secondary | ICD-10-CM

## 2014-02-17 LAB — COMPLETE METABOLIC PANEL WITH GFR
ALK PHOS: 76 U/L (ref 39–117)
ALT: 24 U/L (ref 0–35)
AST: 17 U/L (ref 0–37)
Albumin: 4.3 g/dL (ref 3.5–5.2)
BILIRUBIN TOTAL: 0.5 mg/dL (ref 0.2–1.2)
BUN: 10 mg/dL (ref 6–23)
CO2: 25 mEq/L (ref 19–32)
CREATININE: 0.52 mg/dL (ref 0.50–1.10)
Calcium: 9 mg/dL (ref 8.4–10.5)
Chloride: 104 mEq/L (ref 96–112)
GFR, Est African American: >89 mL/min
GFR, Est Non African American: >89 mL/min
Glucose, Bld: 89 mg/dL (ref 70–99)
Potassium: 4.3 mEq/L (ref 3.5–5.3)
SODIUM: 139 meq/L (ref 135–145)
TOTAL PROTEIN: 7.1 g/dL (ref 6.0–8.3)

## 2014-02-17 LAB — POCT URINALYSIS DIPSTICK
Bilirubin, UA: NEGATIVE
Glucose, UA: NEGATIVE
KETONES UA: NEGATIVE
NITRITE UA: NEGATIVE
PROTEIN UA: NEGATIVE
Spec Grav, UA: 1.015
UROBILINOGEN UA: 0.2
pH, UA: 7

## 2014-02-17 LAB — POCT CBC
Granulocyte percent: 53 %G (ref 37–80)
HCT, POC: 38.7 % (ref 37.7–47.9)
Hemoglobin: 12.3 g/dL (ref 12.2–16.2)
Lymph, poc: 2.6 (ref 0.6–3.4)
MCH: 29.3 pg (ref 27–31.2)
MCHC: 31.6 g/dL — AB (ref 31.8–35.4)
MCV: 92.4 fL (ref 80–97)
MID (CBC): 0.3 (ref 0–0.9)
MPV: 7.9 fL (ref 0–99.8)
PLATELET COUNT, POC: 246 10*3/uL (ref 142–424)
POC Granulocyte: 3.2 (ref 2–6.9)
POC LYMPH PERCENT: 42.4 %L (ref 10–50)
POC MID %: 4.6 % (ref 0–12)
RBC: 4.19 M/uL (ref 4.04–5.48)
RDW, POC: 14 %
WBC: 6.1 10*3/uL (ref 4.6–10.2)

## 2014-02-17 LAB — POCT UA - MICROSCOPIC ONLY
Bacteria, U Microscopic: NEGATIVE
CASTS, UR, LPF, POC: NEGATIVE
CRYSTALS, UR, HPF, POC: NEGATIVE
Epithelial cells, urine per micros: NEGATIVE
Mucus, UA: NEGATIVE
WBC, Ur, HPF, POC: NEGATIVE
YEAST UA: NEGATIVE

## 2014-02-17 LAB — LIPID PANEL
CHOL/HDL RATIO: 3.2 ratio
Cholesterol: 224 mg/dL — ABNORMAL HIGH (ref 0–200)
HDL: 71 mg/dL (ref >39)
LDL CALC: 123 mg/dL — AB (ref 0–99)
TRIGLYCERIDES: 148 mg/dL (ref <150)
VLDL: 30 mg/dL (ref 0–40)

## 2014-02-17 LAB — POCT GLYCOSYLATED HEMOGLOBIN (HGB A1C): Hemoglobin A1C: 5.2

## 2014-02-17 MED ORDER — CIPROFLOXACIN HCL 500 MG PO TABS: 500.0000 mg | ORAL_TABLET | Freq: Two times a day (BID) | ORAL | Status: DC

## 2014-02-17 NOTE — Progress Notes (Signed)
MRN: [id number] DOB: 12/19/1960  Subjective:   [name] is a 53 y.o. female presenting for an annual physical exam.  She also has a concern of 2 episodes of chest pain at the lower sternum.  It was a cramping pain that lasted for about 10 minutes painful cramp.  This occurred at work while she was at work on a break.  She had this associated chills and sweating with associated shortness of breath.  She began forceful respirations, in hopes that that may resolve the symptom.  She denies palpitations at the time.  She denies any correlation to food intake.  There is no chest pain or discomfort with laying down, abdominal pain, n/v, or blood in stool.  She continues to have constipation.  No blood in stool.  She hydrates well.  Takes miralax symptomatically which generally rids patient of the constipation.  She also complains of odorous urine.  She describes the smell as rancid.  This has been episodically, of which she remembers occuring before the start of her iron supplements.  She denies hematuria, dysuria, pain with urination, or polyuria.  She also denies vaginal odor, abnormal discharge.  She does have dryness, which does no She is a nonsmoker.    Tinea has a current medication list which includes the following prescription(s): multivitamin and polysaccharide iron complex.  She is allergic to gabapentin and meloxicam.  [name]  has a past medical history of Palpitations; Headache(784.0); Asthma; and Allergy. Also  has past surgical history that includes Cesarean section; Ovarian cyst removal; and Polypectomy.  ROS As in subjective.  Objective:   Vitals: BP 100/50 mmHg  Pulse 60  Temp(Src) 97.7 F (36.5 C) (Oral)  Resp 16  Ht 5\' 1"  (1.549 m)  Wt 118 lb (53.524 kg)  BMI 22.31 kg/m2  SpO2 99%  Physical Exam  Constitutional: She is oriented to person, place, and time and well-developed, well-nourished, and in no distress. No distress.  HENT:  Head: Normocephalic and  atraumatic.  Right Ear: Hearing, external ear and ear canal normal.  Left Ear: Hearing, tympanic membrane, external ear and ear canal normal.  Nose: Nose normal. No mucosal edema or rhinorrhea.  Mouth/Throat: No uvula swelling. No oropharyngeal exudate, posterior oropharyngeal edema or posterior oropharyngeal erythema.  Cardiovascular: Normal rate and regular rhythm.  Exam reveals no gallop, no distant heart sounds and no friction rub.   No murmur heard. Pulmonary/Chest: Effort normal and breath sounds normal. No apnea. She has no decreased breath sounds. She has no wheezes. She has no rhonchi.  Abdominal: There is no splenomegaly or hepatomegaly. There is tenderness (Mild) in the suprapubic area.  Neurological: She is oriented to person, place, and time.  Skin: Skin is warm, dry and intact.  Psychiatric: Mood, affect and judgment normal.    Wt Readings from Last 3 Encounters:  02/17/14 118 lb (53.524 kg)  12/31/12 116 lb (52.617 kg)  07/28/09 115 lb (52.164 kg)   Estimated body mass index is 22.31 kg/(m^2) as calculated from the following:   Height as of this encounter: 5\' 1"  (1.549 m).   Weight as of this encounter: 118 lb (53.524 kg).  Results for orders placed or performed in visit on 02/17/14  POCT CBC  Result Value Ref Range   WBC 6.1 4.6 - 10.2 K/uL   Lymph, poc 2.6 0.6 - 3.4   POC LYMPH PERCENT 42.4 10 - 50 %L   MID (cbc) 0.3 0 - 0.9   POC MID %  4.6 0 - 12 %M   POC Granulocyte 3.2 2 - 6.9   Granulocyte percent 53.0 37 - 80 %G   RBC 4.19 4.04 - 5.48 M/uL   Hemoglobin 12.3 12.2 - 16.2 g/dL   HCT, POC 38.7 37.7 - 47.9 %   MCV 92.4 80 - 97 fL   MCH, POC 29.3 27 - 31.2 pg   MCHC 31.6 (A) 31.8 - 35.4 g/dL   RDW, POC 14.0 %   Platelet Count, POC 246 142 - 424 K/uL   MPV 7.9 0 - 99.8 fL  POCT glycosylated hemoglobin (Hb A1C)  Result Value Ref Range   Hemoglobin A1C 5.2   POCT UA - Microscopic Only  Result Value Ref Range   WBC, Ur, HPF, POC neg    RBC, urine,  microscopic 15-20    Bacteria, U Microscopic neg    Mucus, UA neg    Epithelial cells, urine per micros neg    Crystals, Ur, HPF, POC neg    Casts, Ur, LPF, POC neg    Yeast, UA neg   POCT urinalysis dipstick  Result Value Ref Range   Color, UA yellow    Clarity, UA clear    Glucose, UA neg    Bilirubin, UA neg    Ketones, UA neg    Spec Grav, UA 1.015    Blood, UA large    pH, UA 7.0    Protein, UA neg    Urobilinogen, UA 0.2    Nitrite, UA neg    Leukocytes, UA Trace     EKG: No acute findings.  No ST changes or changes from prior EKG.     Assessment and Plan :  53 year old female is here today for an annual physical exam and complaint of 2 episodes of chest pain.    Annual physical exam POCT CBC, POCT glycosylated hemoglobin (Hb A1C), POCT UA - Microscopic Only, POCT urinalysis dipstick, COMPLETE METABOLIC PANEL WITH GFR, Lipid panel, TSH  Chest pain, unspecified chest pain type  EKG 12-Lead, Ambulatory referral to Cardiology within 1-2 weeks to monitor.  She has had a full cardiac workup with stress stesting and echo due to palpitations 4 years ago.  Cardiology ollowup appreciated at this time.  Hematuria  ciprofloxacin (CIPRO) 500 MG tablet-hematuria reported by patient in the past.  Full workup with urology who determined it was not significant according to patient.  I will treat with cipro for possible uti.   She will return in 2 weeks-do ua.  If blood found, refer to urology.  If itching is still apparent-possible wet prep.  Constipation Advised to take miralax daily.  Screening for thyroid disorder  TSH  Screening for metabolic disorder  COMPLETE METABOLIC PANEL WITH GFR  Screening for lipid disorders  Lipid panel  Screening for diabetes mellitus POCT glycosylated hemoglobin (Hb A1C), POCT UA - Microscopic Only, POCT urinalysis dipstick  Anemia, iron deficiency  POCT CBC, EKG 12-Lead   Robin Drape, PA-C [facility] 12/16/20157:46 AM

## 2014-02-17 NOTE — Patient Instructions (Signed)
Return in 2 weeks for follow up urinalysis.  Await a call for your cardiology appointment.  I will contact you within 10 days regarding your results.

## 2014-02-18 LAB — TSH: TSH: 1.333 u[IU]/mL (ref 0.350–4.500)

## 2014-03-02 ENCOUNTER — Ambulatory Visit (INDEPENDENT_AMBULATORY_CARE_PROVIDER_SITE_OTHER): Payer: 59 | Admitting: Physician Assistant

## 2014-03-02 VITALS — BP 110/64 | HR 68 | Temp 98.1°F | Resp 16 | Ht 61.5 in | Wt 118.0 lb

## 2014-03-02 DIAGNOSIS — R319 Hematuria, unspecified: Secondary | ICD-10-CM

## 2014-03-02 LAB — POCT UA - MICROSCOPIC ONLY
BACTERIA, U MICROSCOPIC: NEGATIVE
CRYSTALS, UR, HPF, POC: NEGATIVE
Casts, Ur, LPF, POC: NEGATIVE
Mucus, UA: NEGATIVE
Yeast, UA: NEGATIVE

## 2014-03-02 LAB — POCT URINALYSIS DIPSTICK
BILIRUBIN UA: NEGATIVE
GLUCOSE UA: NEGATIVE
KETONES UA: NEGATIVE
LEUKOCYTES UA: NEGATIVE
NITRITE UA: NEGATIVE
PH UA: 7
Protein, UA: NEGATIVE
Spec Grav, UA: 1.01
Urobilinogen, UA: 0.2

## 2014-03-02 NOTE — Progress Notes (Signed)
    MRN: [id number] DOB: Dec 29, 1960  Subjective:   [name] is a 53 y.o. female presenting for follow up.  She was seen here 13 days ago and had asymptomatic hematuria.  She was given cipro and asked for recheck in 2 weeks.  Today, she reports that she is not having any vaginal itching.  She has no concerns to report at this time.  She states that she has rigorous wiping when she uses the bathroom, but has attempted to stop that.  She states that she was seen by Dr. Bubba Camp of urology for past hematuria more than 3 years ago, but no abnormalities were discovered.     Jun has a current medication list which includes the following prescription(s): multivitamin, polysaccharide iron complex, and ciprofloxacin.  She is allergic to gabapentin and meloxicam.  Nolita  has a past medical history of Palpitations; Headache(784.0); Asthma; and Allergy. Also  has past surgical history that includes Cesarean section; Ovarian cyst removal; and Polypectomy.  ROS As in subjective.  Objective:   Vitals: BP 110/64 mmHg  Pulse 68  Temp(Src) 98.1 F (36.7 C) (Oral)  Resp 16  Ht 5' 1.5" (1.562 m)  Wt 118 lb (53.524 kg)  BMI 21.94 kg/m2  SpO2 94%  Physical Exam  Constitutional: She is oriented to person, place, and time and well-developed, well-nourished, and in no distress.  HENT:  Head: Normocephalic and atraumatic.  Eyes: Conjunctivae are normal. Pupils are equal, round, and reactive to light.  Cardiovascular: Normal rate.   Pulmonary/Chest: Effort normal and breath sounds normal. No respiratory distress.  Neurological: She is alert and oriented to person, place, and time.  Psychiatric: Mood, memory, affect and judgment normal.    Results for orders placed or performed in visit on 03/02/14 (from the past 24 hour(s))  POCT urinalysis dipstick     Status: None   Collection Time: 03/02/14  3:14 PM  Result Value Ref Range   Color, UA yellow    Clarity, UA clear    Glucose, UA neg      Bilirubin, UA neg    Ketones, UA neg    Spec Grav, UA 1.010    Blood, UA large    pH, UA 7.0    Protein, UA neg    Urobilinogen, UA 0.2    Nitrite, UA neg    Leukocytes, UA Negative   POCT UA - Microscopic Only     Status: None   Collection Time: 03/02/14  3:14 PM  Result Value Ref Range   WBC, Ur, HPF, POC 0-1    RBC, urine, microscopic 1-3    Bacteria, U Microscopic neg    Mucus, UA neg    Epithelial cells, urine per micros 0-1    Crystals, Ur, HPF, POC neg    Casts, Ur, LPF, POC neg    Yeast, UA neg      Assessment and Plan :  53 year old female is here today for follow up of hematuria.  The hematuria has improved, but not completely resolved.  At this time, urology consult is appreciated.  Culture sent to insure that antibiotic covered well.    Hematuria - Plan: POCT urinalysis dipstick, POCT UA - Microscopic Only, Urine culture, Ambulatory referral to Urology  Ivar Drape, PA-C [facility] 12/28/20159:19 PM

## 2014-03-03 ENCOUNTER — Ambulatory Visit (INDEPENDENT_AMBULATORY_CARE_PROVIDER_SITE_OTHER): Payer: 59 | Admitting: Cardiovascular Disease

## 2014-03-03 ENCOUNTER — Encounter: Payer: Self-pay | Admitting: Cardiovascular Disease

## 2014-03-03 VITALS — BP 112/56 | HR 80 | Ht 61.5 in | Wt 117.0 lb

## 2014-03-03 DIAGNOSIS — R079 Chest pain, unspecified: Secondary | ICD-10-CM | POA: Insufficient documentation

## 2014-03-03 DIAGNOSIS — R072 Precordial pain: Secondary | ICD-10-CM

## 2014-03-03 DIAGNOSIS — R002 Palpitations: Secondary | ICD-10-CM | POA: Insufficient documentation

## 2014-03-03 NOTE — Patient Instructions (Signed)
Your physician recommends that you schedule a follow-up appointment in: AS NEEDED Your physician recommends that you continue on your current medications as directed. Please refer to the Current Medication list given to you today. Your physician has requested that you have an exercise tolerance test. For further information please visit [email or web address]. Please also follow instruction sheet, as given.

## 2014-03-03 NOTE — Assessment & Plan Note (Signed)
Atypical normal ECG and exam F/U ETT suggested she try antacids and PPI ? Esophageal spasm

## 2014-03-03 NOTE — Assessment & Plan Note (Signed)
Resolved Previous benign holter  ECG normal will see what rhythm does on ETT

## 2014-03-03 NOTE — Progress Notes (Signed)
Patient ID: [name], female   DOB: 1960-03-31, 53 y.o.   MRN: [id number]     53 yo referred for atypical chest pain.  Previously seen by Dr Mare Ferrari 2011  for "dizzyness"  And atypical pain.  Had normal treadmill and echo.  She works at [employer]  3 weeks ago had SSCP with diaphoresis and epigastric pain lasted minutes ? Helped by drinking fluid  Recurred at home a weak latter and she drank tea to improve. Not exertional No palpitations  Father has a pacer.  She is originally from [place].  No  Excess ETOH, smoking or drugs.  Currently on cipro for UTI Does not carry diagnosis of GERD or PUD   Reviewed records form Dr Mare Ferrari 07/13/09  Normal ETT echo and event monitor   ROS: Denies fever, malais, weight loss, blurry vision, decreased visual acuity, cough, sputum, SOB, hemoptysis, pleuritic pain, palpitaitons, heartburn, abdominal pain, melena, lower extremity edema, claudication, or rash.  All other systems reviewed and negative   General: Affect appropriate Healthy:  appears stated age 81: normal Neck supple with no adenopathy JVP normal no bruits no thyromegaly Lungs clear with no wheezing and good diaphragmatic motion Heart:  S1/S2 no murmur,rub, gallop or click PMI normal Abdomen: benighn, BS positve, no tenderness, no AAA no bruit.  No HSM or HJR Distal pulses intact with no bruits No edema Neuro non-focal Skin warm and dry No muscular weakness  Medications Current Outpatient Prescriptions  Medication Sig Dispense Refill  . ciprofloxacin (CIPRO) 500 MG tablet Take 1 tablet (500 mg total) by mouth 2 (two) times daily. (Patient not taking: Reported on 03/02/2014) 10 tablet 0  . Multiple Vitamin (MULTIVITAMIN) tablet Take 1 tablet by mouth daily.    . Polysaccharide Iron Complex (NU-IRON PO) Take by mouth.     No current facility-administered medications for this visit.    Allergies Gabapentin and Meloxicam  Family History: Family History  Problem Relation Age of  Onset  . Heart disease    . Hypertension    . Hyperlipidemia    . Hypertension Mother   . Diabetes Paternal Grandfather   . Atrial fibrillation Father     pacemaker  . Hyperlipidemia Brother     Social History: History   Social History  . Marital Status: Married    Spouse Name: N/A    Number of Children: 0  . Years of Education: N/A   Occupational History  . cashier    Social History Main Topics  . Smoking status: Never Smoker   . Smokeless tobacco: Not on file  . Alcohol Use: 0.0 oz/week     Comment: occasional  . Drug Use: No  . Sexual Activity: Not on file   Other Topics Concern  . Not on file   Social History Narrative    Past Surgical History  Procedure Laterality Date  . Cesarean section    . Ovarian cyst removal    . Polypectomy      Past Medical History  Diagnosis Date  . Palpitations   . Headache(784.0)   . Asthma   . Allergy     Electrocardiogram:  12/15  SR rate 60 normal ECG   Assessment and Plan

## 2014-03-04 LAB — URINE CULTURE: Colony Count: 3000

## 2014-03-13 ENCOUNTER — Telehealth: Payer: Self-pay | Admitting: Family Medicine

## 2014-03-13 NOTE — Telephone Encounter (Signed)
Spoke to pts husband per [employer]. Will pick up release of information form, will return completed. Would like recent lab records sent to [facility]

## 2014-03-30 ENCOUNTER — Ambulatory Visit: Payer: 59 | Admitting: Cardiovascular Disease

## 2014-04-06 ENCOUNTER — Ambulatory Visit (INDEPENDENT_AMBULATORY_CARE_PROVIDER_SITE_OTHER): Payer: 59 | Admitting: Physician Assistant

## 2014-04-06 ENCOUNTER — Telehealth: Payer: Self-pay | Admitting: *Deleted

## 2014-04-06 DIAGNOSIS — R079 Chest pain, unspecified: Secondary | ICD-10-CM

## 2014-04-06 NOTE — Telephone Encounter (Signed)
LM TO CALL BACK  WITH GXT RESULTS./CY

## 2014-04-06 NOTE — Telephone Encounter (Signed)
-----   Message from Josue Hector, MD sent at 04/06/2014  4:54 PM EST -----   ----- Message -----    From: Liliane Shi, PA-C    Sent: 04/06/2014  11:28 AM      To: Josue Hector, MD

## 2014-04-06 NOTE — Progress Notes (Signed)
Patient ID: [name], female   DOB: 1960/09/10, 54 y.o.   MRN: [id number] Exercise Treadmill Test  Pre-Exercise Testing Evaluation Rhythm: normal sinus  Rate: 73     Test  Exercise Tolerance Test Ordering MD: Jenkins Rouge, MD  Interpreting MD: Richardson Dopp, PA-C  Unique Test No: 1  Treadmill:  1  Indication for ETT: chest pain - rule out ischemia  Contraindication to ETT: No   Stress Modality: exercise - treadmill  Cardiac Imaging Performed: non   Protocol: standard Bruce - maximal  Max BP:  161/70  Max MPHR (bpm):  166 85% MPR (bpm):  141  MPHR obtained (bpm):  151 % MPHR obtained:  91  Reached 85% MPHR (min:sec):  9:26 Total Exercise Time (min-sec):  10:00  Workload in METS:  11.7 Borg Scale: 13  Reason ETT Terminated:  desired heart rate attained    ST Segment Analysis At Rest: normal ST segments - no evidence of significant ST depression With Exercise: no evidence of significant ST depression  Other Information Arrhythmia:  No Angina during ETT:  absent (0) Quality of ETT:  diagnostic  ETT Interpretation:  normal - no evidence of ischemia by ST analysis  Comments: Good exercise capacity. No chest pain. Normal BP response to exercise. No ST changes to suggest ischemia.   Recommendations: FU with Dr. Jenkins Rouge as planned. Signed,  Richardson Dopp, PA-C   04/06/2014 11:26 AM

## 2014-04-06 NOTE — Progress Notes (Signed)
Normal ETT 

## 2014-04-09 NOTE — Telephone Encounter (Signed)
F/U        Pt husband calling to get stress test results.    Please return pt husband call, [phone] best contact.   VM's are ok to leave, he is working and vm is secure.

## 2014-04-09 NOTE — Telephone Encounter (Signed)
LM  THAT  GXT WAS NORMAL PER  DR Johnsie Cancel .Adonis Housekeeper

## 2014-07-21 ENCOUNTER — Encounter: Payer: Self-pay | Admitting: Gastroenterology

## 2016-01-01 ENCOUNTER — Ambulatory Visit: Payer: Self-pay

## 2016-01-11 ENCOUNTER — Encounter: Payer: Self-pay | Admitting: Physician Assistant

## 2016-01-11 ENCOUNTER — Telehealth: Payer: Self-pay | Admitting: Internal Medicine

## 2016-01-11 DIAGNOSIS — R351 Nocturia: Secondary | ICD-10-CM | POA: Insufficient documentation

## 2016-01-11 DIAGNOSIS — R3129 Other microscopic hematuria: Secondary | ICD-10-CM | POA: Insufficient documentation

## 2016-01-11 NOTE — Telephone Encounter (Signed)
LVM advising patient of message below, awaiting call back.

## 2016-01-11 NOTE — Telephone Encounter (Signed)
LVM to inform patient that she can call and schedule appt before Jan.

## 2016-01-11 NOTE — Telephone Encounter (Signed)
Okay to use 2-15 minute appt slots.

## 2016-01-11 NOTE — Telephone Encounter (Signed)
Caller name:Melder,Ronnie Relation to [id number]  Call back number: [phone]    Reason for call:  Patient is from [place] and would like to establish care with Dr. Larose Kells, please advise

## 2016-01-11 NOTE — Telephone Encounter (Signed)
Patient would like to know if she could a CPE appt before January, which is the first available. Please advise.

## 2016-01-11 NOTE — Telephone Encounter (Signed)
Ok, schedule a visit at  her convenience , CPX ok if  so desire

## 2016-02-17 ENCOUNTER — Telehealth: Payer: Self-pay

## 2016-02-17 NOTE — Telephone Encounter (Signed)
02/17/16  Medication: Reviewed  Review, verify sig & reconcile(including outside meds): Duplicates discarded: DM supply source:  Preferred Pharmacy and which med where: 90 day supply/mail order:  Local pharmacy:    Allergies verified:  Immunization Status: Prompted for insurance verification:  Flu vaccine-- Tdap-- PNA-- Shingles--  A/P:   Changes to [place], PSH or Personal Hx: Pap-- MMG-- Bone Density-- CCS--  Care Teams Updated: ED/Hospital/Urgent Care Visits: Prompted for: Updated insurance, contact information, forms: Remind to bring: DPR information, advance directives:   To Discuss with Provider:

## 2016-02-18 ENCOUNTER — Ambulatory Visit (INDEPENDENT_AMBULATORY_CARE_PROVIDER_SITE_OTHER): Payer: 59 | Admitting: Internal Medicine

## 2016-02-18 ENCOUNTER — Ambulatory Visit (HOSPITAL_BASED_OUTPATIENT_CLINIC_OR_DEPARTMENT_OTHER)
Admission: RE | Admit: 2016-02-18 | Discharge: 2016-02-18 | Disposition: A | Discharge status: Home / Self Care | Payer: 59 | Source: Ambulatory Visit | Attending: Internal Medicine | Admitting: Internal Medicine

## 2016-02-18 ENCOUNTER — Encounter: Payer: Self-pay | Admitting: Internal Medicine

## 2016-02-18 VITALS — BP 108/66 | HR 73 | Temp 97.6°F | Resp 12 | Ht 61.5 in | Wt 119.0 lb

## 2016-02-18 DIAGNOSIS — R002 Palpitations: Secondary | ICD-10-CM | POA: Diagnosis not present

## 2016-02-18 DIAGNOSIS — F411 Generalized anxiety disorder: Secondary | ICD-10-CM | POA: Diagnosis not present

## 2016-02-18 DIAGNOSIS — R42 Dizziness and giddiness: Secondary | ICD-10-CM

## 2016-02-18 DIAGNOSIS — J984 Other disorders of lung: Secondary | ICD-10-CM | POA: Diagnosis not present

## 2016-02-18 DIAGNOSIS — Z23 Encounter for immunization: Secondary | ICD-10-CM

## 2016-02-18 DIAGNOSIS — M791 Myalgia, unspecified site: Secondary | ICD-10-CM

## 2016-02-18 DIAGNOSIS — R042 Hemoptysis: Secondary | ICD-10-CM | POA: Diagnosis not present

## 2016-02-18 DIAGNOSIS — Z8611 Personal history of tuberculosis: Secondary | ICD-10-CM | POA: Insufficient documentation

## 2016-02-18 LAB — CBC WITH DIFFERENTIAL/PLATELET
BASOS PCT: 1.2 % (ref 0.0–3.0)
Basophils Absolute: 0.1 10*3/uL (ref 0.0–0.1)
EOS ABS: 0.7 10*3/uL (ref 0.0–0.7)
EOS PCT: 14.6 % — AB (ref 0.0–5.0)
HCT: 37.6 % (ref 36.0–46.0)
Hemoglobin: 12.8 g/dL (ref 12.0–15.0)
LYMPHS ABS: 1.8 10*3/uL (ref 0.7–4.0)
Lymphocytes Relative: 35.1 % (ref 12.0–46.0)
MCHC: 33.9 g/dL (ref 30.0–36.0)
MCV: 90.4 fl (ref 78.0–100.0)
MONO ABS: 0.2 10*3/uL (ref 0.1–1.0)
Monocytes Relative: 4.8 % (ref 3.0–12.0)
NEUTROS ABS: 2.3 10*3/uL (ref 1.4–7.7)
NEUTROS PCT: 44.3 % (ref 43.0–77.0)
PLATELETS: 262 10*3/uL (ref 150.0–400.0)
RBC: 4.16 Mil/uL (ref 3.87–5.11)
RDW: 13.5 % (ref 11.5–15.5)
WBC: 5.1 10*3/uL (ref 4.0–10.5)

## 2016-02-18 LAB — COMPREHENSIVE METABOLIC PANEL
ALBUMIN: 4.2 g/dL (ref 3.5–5.2)
ALT: 15 U/L (ref 0–35)
AST: 17 U/L (ref 0–37)
Alkaline Phosphatase: 85 U/L (ref 39–117)
BUN: 13 mg/dL (ref 6–23)
CHLORIDE: 105 meq/L (ref 96–112)
CO2: 31 mEq/L (ref 19–32)
CREATININE: 0.55 mg/dL (ref 0.40–1.20)
Calcium: 9.1 mg/dL (ref 8.4–10.5)
GFR: 121.58 mL/min (ref >60.00)
GLUCOSE: 95 mg/dL (ref 70–99)
POTASSIUM: 4.4 meq/L (ref 3.5–5.1)
SODIUM: 140 meq/L (ref 135–145)
Total Bilirubin: 0.6 mg/dL (ref 0.2–1.2)
Total Protein: 7 g/dL (ref 6.0–8.3)

## 2016-02-18 LAB — CK: Total CK: 117 U/L (ref 7–177)

## 2016-02-18 LAB — SEDIMENTATION RATE: Sed Rate: 3 mm/hr (ref 0–30)

## 2016-02-18 LAB — FOLATE: Folate: 15.5 ng/mL (ref >5.9)

## 2016-02-18 LAB — TSH: TSH: 1.17 u[IU]/mL (ref 0.35–4.50)

## 2016-02-18 LAB — VITAMIN B12: VITAMIN B 12: 252 pg/mL (ref 211–911)

## 2016-02-18 NOTE — Progress Notes (Signed)
Pre visit review using our clinic review tool, if applicable. No additional management support is needed unless otherwise documented below in the visit note. 

## 2016-02-18 NOTE — Progress Notes (Signed)
Subjective:    Patient ID: [name], female    DOB: 09-02-1960, 55 y.o.   MRN: [id number]  DOS:  02/18/2016 Type of visit - description : New patient, to get established. Here with her husband. Interval history: The patient reports she is not feeling well, states she had a very hard year. She has on and off episodes of palpitations, dizziness, feeling weird, presyncope. Usually happen at work, when asked, admits that they are associated with an anxiety feeling. Denies difficulty breathing but has associated nausea sometimes.  Also has developed ill-defined aches and pains at the neck, back, sometimes in the lower extremity mostly on the left. Denies paresthesias or numbness except for sometimes the left third digit at night is numb.  Also concerned because early in the mornings she is usually very congested , she "brings up" mucus with some blood. Is not clear to her if the mucus is coming from the chest or from post nasal drip  When asked, admits to a lot of stress related to family issues, not with her family in the [place] but the family she has in [place], her country of origin.  Review of Systems Denies fever or chills. Admits to some allergy symptoms including itchy eyes and nose as well as sneezing. Reports mild headaches. Suicidal ideas.   Past Medical History:  Diagnosis Date  . Allergy   . Asthma   . Headache(784.0)   . Palpitations     Past Surgical History:  Procedure Laterality Date  . OVARIAN CYST REMOVAL    . POLYPECTOMY      Social History   Social History  . Marital status: Married    Spouse name: N/A  . Number of children: 0  . 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 Smoking status: Never Smoker  . Smokeless tobacco: Never Used  . Alcohol use 0.0 oz/week     Comment: occasional  . Drug use: No  . Sexual activity: Not on file   Other Topics Concern  . Not on file   Social History  Narrative   From [place]     Family History  Problem Relation Age of Onset  . Hypertension Mother   . Diabetes Paternal Grandfather   . Atrial fibrillation Father     pacemaker  . Hyperlipidemia Brother   . Heart disease    . Hypertension    . Hyperlipidemia    . Hypertension    . Colon cancer Neg Hx   . Breast cancer Neg Hx      Allergies as of 02/18/2016   No Known Allergies     Medication List       Accurate as of 02/18/16 11:59 PM. Always use your most recent med list.          meloxicam 15 MG tablet Commonly known as:  MOBIC Take 15 mg by mouth daily as needed for pain.          Objective:   Physical Exam BP 108/66 (BP Location: Left Arm, Patient Position: Sitting, Cuff Size: Small)   Pulse 73   Temp 97.6 F (36.4 C) (Oral)   Resp 12   Ht 5' 1.5" (1.562 m)   Wt 119 lb (54 kg)   SpO2 98%   BMI 22.12 kg/m  General:   Well developed, well nourished . NAD.  HEENT:  Normocephalic . Face symmetric,  atraumatic  Neck-no thyromegaly. Lungs:  CTA B Normal respiratory effort, no intercostal retractions, no accessory muscle use. Heart: RRR,  no murmur.  no pretibial edema bilaterally . Lower extremities: Normal femoral and pedal pulses Abdomen:  Not distended, soft, non-tender. No rebound or rigidity.  Skin: Not pale. Not jaundice Neurologic:  alert & oriented X3.  Speech normal, gait appropriate for age and unassisted Psych--  Cognition and judgment appear intact.  Cooperative with normal attention span and concentration.  Behavior appropriate. Willa Frater we talk about her stress, she started crying    Assessment & Plan:    Assessment Chest pain, dizziness: Normal ETT echo and event monitor 2011. ETT again 04-2014: Normal, good exercise capacity Hematuria: per patient previous urology w/u (-). Dr McDermoth Colon polyps  Menopause since ~ age 31  PLAN: Dizziness, palpitations: A chronic issue, previously cardiac w/u (-). I believe  sx are  related to anxiety. Patient agrees. Will obtain: CMP, CBC, TSH. Anxiety, some depression: related to family issues. Counseled here today. Provided information about a counselor who speaks Spanish. I also mentioned medications and advised her to let me know if/when she is ready  Ill-defined pain at the neck, back, myalgias: Will obtain a CK, vitamin D, 123456, folic acid. Hemoptysis?: See above, she is low risk for cancer, never smoker. Will get a chest x-ray to be sure otherwise recommend Flonase for possible allergies. Flu shot today RTC 2 months, CPX

## 2016-02-18 NOTE — Patient Instructions (Addendum)
GO TO THE LAB : Get the blood work     GO TO THE FRONT DESK Schedule your next appointment for a  physical exam     STOP BY THE FIRST FLOOR:  get the XR    Please consider see a counselor, call if you feel you are ready for medication

## 2016-02-20 DIAGNOSIS — R42 Dizziness and giddiness: Secondary | ICD-10-CM | POA: Insufficient documentation

## 2016-02-20 DIAGNOSIS — F411 Generalized anxiety disorder: Secondary | ICD-10-CM | POA: Insufficient documentation

## 2016-02-20 DIAGNOSIS — Z09 Encounter for follow-up examination after completed treatment for conditions other than malignant neoplasm: Secondary | ICD-10-CM | POA: Insufficient documentation

## 2016-02-20 LAB — VITAMIN D 1,25 DIHYDROXY
Vitamin D 1, 25 (OH)2 Total: 55 pg/mL (ref 18–72)
Vitamin D2 1, 25 (OH)2: <8 pg/mL
Vitamin D3 1, 25 (OH)2: 55 pg/mL

## 2016-02-20 NOTE — Assessment & Plan Note (Signed)
Dizziness, palpitations: A chronic issue, previously cardiac w/u (-). I believe sx are  related to anxiety. Patient agrees. Will obtain: CMP, CBC, TSH. Anxiety, some depression: related to family issues. Counseled here today. Provided information about a counselor who speaks Spanish. I also mentioned medications and advised her to let me know if/when she is ready  Ill-defined pain at the neck, back, myalgias: Will obtain a CK, vitamin D, 123456, folic acid. Hemoptysis?: See above, she is low risk for cancer, never smoker. Will get a chest x-ray to be sure otherwise recommend Flonase for possible allergies. Flu shot today RTC 2 months, CPX

## 2016-03-10 ENCOUNTER — Encounter: Payer: 59 | Admitting: Internal Medicine

## 2016-03-15 ENCOUNTER — Other Ambulatory Visit: Payer: Self-pay | Admitting: Obstetrics and Gynecology

## 2016-03-15 LAB — HM MAMMOGRAPHY

## 2016-03-17 ENCOUNTER — Telehealth: Payer: Self-pay | Admitting: Internal Medicine

## 2016-03-17 NOTE — Telephone Encounter (Signed)
Noted  

## 2016-03-17 NOTE — Telephone Encounter (Signed)
FYI Patient is requesting all Labs for 03/20/16 visit be sent to [facility] since [facility] is no longer in network.

## 2016-03-20 ENCOUNTER — Ambulatory Visit (INDEPENDENT_AMBULATORY_CARE_PROVIDER_SITE_OTHER): Payer: 59 | Admitting: Internal Medicine

## 2016-03-20 ENCOUNTER — Encounter: Payer: Self-pay | Admitting: Internal Medicine

## 2016-03-20 VITALS — BP 122/74 | HR 71 | Temp 98.0°F | Resp 14 | Ht 62.0 in | Wt 117.2 lb

## 2016-03-20 DIAGNOSIS — Z0000 Encounter for general adult medical examination without abnormal findings: Secondary | ICD-10-CM | POA: Insufficient documentation

## 2016-03-20 DIAGNOSIS — R238 Other skin changes: Secondary | ICD-10-CM | POA: Diagnosis not present

## 2016-03-20 DIAGNOSIS — R233 Spontaneous ecchymoses: Secondary | ICD-10-CM

## 2016-03-20 DIAGNOSIS — Z114 Encounter for screening for human immunodeficiency virus [HIV]: Secondary | ICD-10-CM

## 2016-03-20 DIAGNOSIS — Z1159 Encounter for screening for other viral diseases: Secondary | ICD-10-CM

## 2016-03-20 LAB — PROTIME-INR
INR: 1.1 ratio — AB (ref 0.8–1.0)
Prothrombin Time: 11.5 s (ref 9.6–13.1)

## 2016-03-20 LAB — APTT: APTT: 29.8 s (ref 23.4–32.7)

## 2016-03-20 LAB — CYTOLOGY - PAP

## 2016-03-20 NOTE — Progress Notes (Signed)
Subjective:    Patient ID: [name], female    DOB: Jul 24, 1960, 56 y.o.   MRN: [id number]  DOS:  03/20/2016 Type of visit - description : CPX Interval history: No major concerns today  Review of Systems  Constitutional: No fever. No chills. No unexplained wt changes. No unusual sweats  HEENT: No dental problems, no ear discharge, no facial swelling, no voice changes. No eye discharge, no eye  redness , no  intolerance to light   Respiratory: No wheezing , no  difficulty breathing. No cough , no mucus production  Cardiovascular: No CP, no leg swelling , no  Palpitations  GI: no nausea, no vomiting, no diarrhea , no  abdominal pain.  No blood in the stools. No dysphagia, no odynophagia    Endocrine: No polyphagia, no polyuria , no polydipsia  GU: No dysuria, gross hematuria, difficulty urinating. No urinary urgency, no frequency.  Musculoskeletal: No joint swellings or unusual aches or pains  Skin: Reports a history of easy bruising, when asked, she also needs to occasional nosebleeds.  Allergic, immunologic: No environmental allergies , no  food allergies  Neurological: No dizziness no  syncope.  No diplopia, no slurred, no slurred speech, no motor deficits, no facial  Numbness Reports several years history of occasional headache, often times at the nuchal area, sometimes the top of the head, usually ibuprofen helps. When the pain is at the nuchal area it does not radiate to the arms Hematological: No enlarged lymph nodes, no easy bruising , no unusual bleedings  Psychiatry: No suicidal ideas, no hallucinations, no beavior problems, no confusion.  No unusual/severe anxiety, no depression    Past Medical History:  Diagnosis Date  . Allergy   . Asthma   . Headache(784.0)   . Palpitations     Past Surgical History:  Procedure Laterality Date  . OVARIAN CYST REMOVAL    . POLYPECTOMY      Social History   Social History  . Marital status: Married    Spouse  name: N/A  . Number of children: 0  . 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 Smoking status: Never Smoker  . Smokeless tobacco: Never Used  . Alcohol use 0.0 oz/week     Comment: occasional  . Drug use: No  . Sexual activity: Not on file   Other Topics Concern  . Not on file   Social History Narrative   From [place]     Family History  Problem Relation Age of Onset  . Hypertension Mother   . Diabetes Paternal Grandfather   . Atrial fibrillation Father     pacemaker  . Hyperlipidemia Brother   . Heart disease    . Hypertension    . Hyperlipidemia    . Hypertension    . Colon cancer Neg Hx   . Breast cancer Neg Hx     Allergies as of 03/20/2016   No Known Allergies     Medication List    as of 03/20/2016 11:59 PM   You have not been prescribed any medications.        Objective:   Physical Exam BP 122/74 (BP Location: Left Arm, Patient Position: Sitting, Cuff Size: Small)   Pulse 71   Temp 98 F (36.7 C) (Oral)   Resp 14   Ht 5\' 2"  (1.575 m)   Wt 117 lb 4 oz (53.2  kg)   SpO2 98%   BMI 21.45 kg/m   General:   Well developed, well nourished . NAD.  Neck: No  thyromegaly  HEENT:  Normocephalic . Face symmetric, atraumatic Lungs:  CTA B Normal respiratory effort, no intercostal retractions, no accessory muscle use. Heart: RRR,  no murmur.  No pretibial edema bilaterally  Abdomen:  Not distended, soft, non-tender. No rebound or rigidity.   Skin: Exposed areas without rash. Not pale. Not jaundice Neurologic:  alert & oriented X3.  Speech normal, gait appropriate for age and unassisted Strength symmetric and appropriate for age.  Psych: Cognition and judgment appear intact.  Cooperative with normal attention span and concentration.  Behavior appropriate. No anxious or depressed appearing.    Assessment & Plan:    Assessment Chest pain, dizziness: Normal ETT echo and event  monitor 2011. ETT again 04-2014: Normal, good exercise capacity Hematuria: per patient previous urology w/u (-). Dr McDermoth Colon polyps  Menopause since ~ age 33 H/o hair  loss, saw dermatology, biopsy done.  PLAN: Anxiety depression: At this point, doing ok Ill-defined myalgias: w/u neg, Rx observation Hemoptysis? Chest x-ray was negative, no further episodes however she reports a history of easy bruising and occasional nosebleeds. CBC was normal, for completeness we'll check a PT/PTT. RTC one year

## 2016-03-20 NOTE — Assessment & Plan Note (Addendum)
Td 2013 per pt  Had a flu shot Female care-- per gyn, had a PAP MMG last week  CCS- Cscope 2011, Dr Deatra Ina, diverticuli  dexa- menopausal at age 56 , consider dexa next year Recently had a screening, triglycerides were 317 but apparently she wasn't fasting.  Labs : FLP, hep C, HIV, PT/PTT. Diet and exercise discussed

## 2016-03-20 NOTE — Patient Instructions (Signed)
GO TO THE LAB : Get the blood work     GO TO THE FRONT DESK Schedule your next appointment for a   Physical next year

## 2016-03-20 NOTE — Progress Notes (Signed)
Pre visit review using our clinic review tool, if applicable. No additional management support is needed unless otherwise documented below in the visit note. 

## 2016-03-21 NOTE — Assessment & Plan Note (Signed)
Anxiety depression: At this point, doing ok Ill-defined myalgias: w/u neg, Rx observation Hemoptysis? Chest x-ray was negative, no further episodes however she reports a history of easy bruising and occasional nosebleeds. CBC was normal, for completeness we'll check a PT/PTT. RTC one year

## 2016-03-22 LAB — HIV ANTIBODY (ROUTINE TESTING W REFLEX): HIV SCREEN 4TH GENERATION: NONREACTIVE

## 2016-03-22 LAB — LIPID PANEL
Chol/HDL Ratio: 3.3 ratio units (ref 0.0–4.4)
Cholesterol, Total: 221 mg/dL — ABNORMAL HIGH (ref 100–199)
HDL: 67 mg/dL (ref >39)
LDL CALC: 132 mg/dL — AB (ref 0–99)
Triglycerides: 109 mg/dL (ref 0–149)
VLDL CHOLESTEROL CAL: 22 mg/dL (ref 5–40)

## 2016-03-22 LAB — HEPATITIS C ANTIBODY

## 2016-03-23 ENCOUNTER — Encounter: Payer: Self-pay | Admitting: Internal Medicine

## 2016-04-17 LAB — HM PAP SMEAR

## 2016-04-20 ENCOUNTER — Encounter: Payer: Self-pay | Admitting: Internal Medicine

## 2016-04-28 HISTORY — PX: COLPOSCOPY: SHX161

## 2016-07-07 ENCOUNTER — Telehealth: Payer: Self-pay | Admitting: Gastroenterology

## 2016-07-07 ENCOUNTER — Encounter: Payer: Self-pay | Admitting: Gastroenterology

## 2016-07-07 NOTE — Telephone Encounter (Signed)
Left message for patient husband to return my call

## 2016-07-07 NOTE — Telephone Encounter (Signed)
OK 

## 2016-08-16 ENCOUNTER — Encounter: Payer: Self-pay | Admitting: Gastroenterology

## 2016-08-16 ENCOUNTER — Ambulatory Visit (INDEPENDENT_AMBULATORY_CARE_PROVIDER_SITE_OTHER): Payer: 59 | Admitting: Gastroenterology

## 2016-08-16 VITALS — BP 100/60 | HR 64 | Ht 60.0 in | Wt 117.8 lb

## 2016-08-16 DIAGNOSIS — R1013 Epigastric pain: Secondary | ICD-10-CM

## 2016-08-16 DIAGNOSIS — R197 Diarrhea, unspecified: Secondary | ICD-10-CM

## 2016-08-16 MED ORDER — OMEPRAZOLE 40 MG PO CPDR: 40.0000 mg | DELAYED_RELEASE_CAPSULE | Freq: Every day | ORAL | 2 refills | Status: DC

## 2016-08-16 NOTE — Progress Notes (Signed)
History of Present Illness: This is a 56 year old female self referred for the evaluation of epigastric abdominal pain, gas, belching, nausea and diarrhea. She is accompanied by her husband. Previously followed by Dr. Deatra Ina. Colonoscopy in 08/2009 by [facility] showed mild transverse colon diverticulosis, otherwise normal. Patient relates postprandial epigastric pain that lasts for a few minutes and is associated with belching and nausea about 30 minutes after most meals. She has not tried any antacids or acid reducing medications. She notes certain foods will exacerbate the symptoms. She has frequent problems with loose urgent stools in the mornings she states she is lactose intolerance and avoids all lactose products except yogurt which she has most mornings. She also notes that increased coffee intake will lead to diarrhea so she tries to moderate her coffee consumption. Blood work in December and January was unremarkable. Denies weight loss, constipation, change in stool caliber, melena, hematochezia, vomiting, dysphagia, chest pain.   No Known Allergies No outpatient prescriptions prior to visit.   No facility-administered medications prior to visit.    Past Medical History:  Diagnosis Date  . Allergy   . Asthma   . Headache(784.0)   . Palpitations   . Pneumonia    Past Surgical History:  Procedure Laterality Date  . COLONOSCOPY    . COLPOSCOPY  04/28/2016  . OVARIAN CYST REMOVAL    . POLYPECTOMY     Social History   Social History  . Marital status: Married    Spouse name: N/A  . Number of children: 0  . 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 Smoking status: Never Smoker  . Smokeless tobacco: Never Used  . Alcohol use 0.0 oz/week     Comment: occasional  . Drug use: No  . Sexual activity: Not Asked   Other Topics Concern  . None   Social History Narrative   From [place]   Family History  Problem  Relation Age of Onset  . Hypertension Mother   . Diabetes Paternal Grandfather   . Atrial fibrillation Father        pacemaker  . Other Father        blood clotting disorder  . Hyperlipidemia Brother   . Heart disease Unknown   . Hypertension Unknown   . Hyperlipidemia Unknown   . Hypertension Unknown   . Colon cancer Neg Hx   . Breast cancer Neg Hx       Review of Systems: Pertinent positive and negative review of systems were noted in the above HPI section. All other review of systems were otherwise negative.   Physical Exam: General: Well developed, well nourished, no acute distress Head: Normocephalic and atraumatic Eyes:  sclerae anicteric, EOMI Ears: Normal auditory acuity Mouth: No deformity or lesions Neck: Supple, no masses or thyromegaly Lungs: Clear throughout to auscultation Heart: Regular rate and rhythm; no murmurs, rubs or bruits Abdomen: Soft, non tender and non distended. No masses, hepatosplenomegaly or hernias noted. Normal Bowel sounds Rectal: not done Musculoskeletal: Symmetrical with no gross deformities  Skin: No lesions on visible extremities Pulses:  Normal pulses noted Extremities: No clubbing, cyanosis, edema or deformities noted Neurological: Alert oriented x 4, grossly nonfocal Cervical Nodes:  No significant cervical adenopathy Inguinal Nodes: No significant inguinal adenopathy Psychological:  Alert and cooperative. Normal mood and affect  Assessment and Recommendations:  1. Epigastric pain, nausea, belching. Suspected GERD or  gastritis. Rule out cholelithiasis. Schedule abdominal ultrasound. Begin omeprazole 40 mg daily and follow antireflux measures. Avoid any foods that exacerbate symptoms. His symptoms do not respond consider EGD. REV in 6 weeks.  2. Mild diarrhea. Lactose intolerance. Strictly avoid all lactose products including yogurt. Minimize coffee and caffeine intake. If diarrhea persists will proceed with evaluation for celiac  disease, infectious etiologies, etc. REV in 6 weeks.

## 2016-08-16 NOTE — Patient Instructions (Signed)
We have sent the following medications to your pharmacy for you to pick up at your convenience: omeprazole.   Patient advised to avoid spicy, acidic, citrus, chocolate, mints, fruit and fruit juices.  Limit the intake of caffeine, alcohol and Soda.  Don't exercise too soon after eating.  Don't lie down within 3-4 hours of eating.  Elevate the head of your bed.  You have been scheduled for an abdominal ultrasound at [facility] (1st floor of hospital) on 08/22/16 at 11:30am. Please arrive 15 minutes prior to your appointment for registration. Make certain not to have anything to eat or drink 6 hours prior to your appointment. Should you need to reschedule your appointment, please contact radiology at [phone]. This test typically takes about 30 minutes to perform.   Thank you for choosing me and [facility].  Pricilla Riffle. Dagoberto Ligas., MD., [facility]

## 2016-08-22 ENCOUNTER — Ambulatory Visit (HOSPITAL_COMMUNITY): Payer: 59

## 2016-08-25 ENCOUNTER — Ambulatory Visit (HOSPITAL_COMMUNITY)
Admission: RE | Admit: 2016-08-25 | Discharge: 2016-08-25 | Disposition: A | Discharge status: Home / Self Care | Payer: 59 | Source: Ambulatory Visit | Attending: Gastroenterology | Admitting: Gastroenterology

## 2016-08-25 DIAGNOSIS — R1013 Epigastric pain: Secondary | ICD-10-CM

## 2016-08-25 DIAGNOSIS — R197 Diarrhea, unspecified: Secondary | ICD-10-CM | POA: Diagnosis not present

## 2016-08-25 DIAGNOSIS — K8020 Calculus of gallbladder without cholecystitis without obstruction: Secondary | ICD-10-CM | POA: Diagnosis not present

## 2016-08-28 ENCOUNTER — Telehealth: Payer: Self-pay | Admitting: Gastroenterology

## 2016-08-28 NOTE — Telephone Encounter (Signed)
Patient husband returning Sheri's call about [place] results from 6.22.18.

## 2016-08-28 NOTE — Telephone Encounter (Signed)
See results notes for additional details. 

## 2016-08-30 ENCOUNTER — Other Ambulatory Visit (INDEPENDENT_AMBULATORY_CARE_PROVIDER_SITE_OTHER): Payer: 59

## 2016-08-30 ENCOUNTER — Encounter: Payer: Self-pay | Admitting: Gastroenterology

## 2016-08-30 ENCOUNTER — Ambulatory Visit (INDEPENDENT_AMBULATORY_CARE_PROVIDER_SITE_OTHER): Payer: 59 | Admitting: Gastroenterology

## 2016-08-30 VITALS — BP 90/60 | HR 80 | Ht 59.0 in | Wt 117.0 lb

## 2016-08-30 DIAGNOSIS — K219 Gastro-esophageal reflux disease without esophagitis: Secondary | ICD-10-CM

## 2016-08-30 DIAGNOSIS — R140 Abdominal distension (gaseous): Secondary | ICD-10-CM

## 2016-08-30 DIAGNOSIS — R935 Abnormal findings on diagnostic imaging of other abdominal regions, including retroperitoneum: Secondary | ICD-10-CM

## 2016-08-30 LAB — HEPATIC FUNCTION PANEL
ALK PHOS: 90 U/L (ref 39–117)
ALT: 15 U/L (ref 0–35)
AST: 14 U/L (ref 0–37)
Albumin: 4.3 g/dL (ref 3.5–5.2)
BILIRUBIN DIRECT: 0.1 mg/dL (ref 0.0–0.3)
BILIRUBIN TOTAL: 0.4 mg/dL (ref 0.2–1.2)
TOTAL PROTEIN: 6.9 g/dL (ref 6.0–8.3)

## 2016-08-30 LAB — LIPASE: Lipase: 19 U/L (ref 11.0–59.0)

## 2016-08-30 MED ORDER — DICYCLOMINE HCL 10 MG PO CAPS: 10.0000 mg | ORAL_CAPSULE | Freq: Three times a day (TID) | ORAL | 11 refills | Status: DC | PRN

## 2016-08-30 MED ORDER — SIMETHICONE 80 MG PO CHEW: 80.0000 mg | CHEWABLE_TABLET | Freq: Four times a day (QID) | ORAL | 0 refills | Status: DC | PRN

## 2016-08-30 NOTE — Progress Notes (Signed)
    History of Present Illness: This is a 56 year old female returning for follow-up of epigastric pain, abdominal bloating, diarrhea. He is accompanied by her husband. Diarrhea has resolved since avoiding lactose products and decreasing caffeine intake. Her abdominal pain, nausea and belching symptoms have improved on daily omeprazole however she notes persistent problems with gas, bloating and abdominal discomfort. She and her husband were very concerned about the ultrasound results showing "cholelithiasis without complicating factors and hepatic heterogeneity without focal mass. This may be related to underlying hepatocellular disease." The ultrasound report reference is a comparison study dated 03/30/2014 which is not included in [place] and after discussion with the patient this could be a noncontrasted abd CT scan performed at [facility].  Current Medications, Allergies, Past Medical History, Past Surgical History, Family History and Social History were reviewed in [employer] record.  Physical Exam: General: Well developed, well nourished, no acute distress Head: Normocephalic and atraumatic Eyes:  sclerae anicteric, EOMI Ears: Normal auditory acuity Mouth: No deformity or lesions Lungs: Clear throughout to auscultation Heart: Regular rate and rhythm; no murmurs, rubs or bruits Abdomen: Soft, non tender and non distended. No masses, hepatosplenomegaly or hernias noted. Normal Bowel sounds Musculoskeletal: Symmetrical with no gross deformities  Pulses:  Normal pulses noted Extremities: No clubbing, cyanosis, edema or deformities noted Neurological: Alert oriented x 4, grossly nonfocal Psychological:  Alert and cooperative. Normal mood and affect  Assessment and Recommendations:  1. Suspected GERD. Continue standard antireflux measures and omeprazole 40 mg daily.  2. Abdominal discomfort, bloating and gas. Begin a low gas diet. Gas-X 4 times a day when  necessary. Dicyclomine 10 mg 3 times a day when necessary. Consider abd/pelvic CT if symptoms not resolved. REV in 4 weeks.   3. Lactose and caffeine intolerance. Continue to minimize or avoid these long term.  4. Cholelithiasis. Appears to be asymptomatic as current symptoms are not typical for biliary disease. Check LFTs and lipase today.  5. Hepatic heterogeneity. Possible fatty infiltration of liver or other disease. LFTs as above. The patient and her husband, however particularly her husband, were very concerned about this particular finding. Reassured that it does not represent neoplastic process or cirrhosis. Consider further evaluation with a contrasted abdominal/pelvic CT. REV in 4 weeks.

## 2016-08-30 NOTE — Patient Instructions (Signed)
Your physician has requested that you go to the basement for lab work before leaving today.  We have sent the following medications to your pharmacy for you to pick up at your convenience:  Dicyclomine 10mg , Take three times a day, as needed  You may also take over the counter: Gas X, four times a day, as needed  You have been given a low gas diet to follow.   Thank you for choosing me and [facility].  Pricilla Riffle. Dagoberto Ligas., MD., Marval Regal

## 2016-09-22 ENCOUNTER — Ambulatory Visit: Payer: 59 | Admitting: Gastroenterology

## 2016-10-02 ENCOUNTER — Ambulatory Visit: Payer: 59 | Admitting: Gastroenterology

## 2017-02-06 ENCOUNTER — Ambulatory Visit (INDEPENDENT_AMBULATORY_CARE_PROVIDER_SITE_OTHER): Payer: 59 | Admitting: Internal Medicine

## 2017-02-06 ENCOUNTER — Encounter: Payer: Self-pay | Admitting: Internal Medicine

## 2017-02-06 VITALS — BP 108/68 | HR 69 | Temp 97.9°F | Resp 14 | Ht 59.0 in | Wt 116.5 lb

## 2017-02-06 DIAGNOSIS — R21 Rash and other nonspecific skin eruption: Secondary | ICD-10-CM

## 2017-02-06 MED ORDER — KETOCONAZOLE 2 % EX CREA: 1.0000 "application " | TOPICAL_CREAM | Freq: Every day | CUTANEOUS | 0 refills | Status: DC

## 2017-02-06 NOTE — Patient Instructions (Signed)
Mix ketoconazole (the cream I prescribed) with OTC hydrocortisone 1% cream.  Apply the mix  to both areas twice a day for 10 days  Call if you are not better gradually in the next 3-4 weeks  Call if the rash resurface

## 2017-02-06 NOTE — Progress Notes (Signed)
Pre visit review using our clinic review tool, if applicable. No additional management support is needed unless otherwise documented below in the visit note. 

## 2017-02-06 NOTE — Progress Notes (Signed)
Subjective:    Patient ID: [name], female    DOB: [birth year], 55 y.o.   MRN: [id number]  DOS:  02/06/2017 Type of visit - description : acute Interval history: Had a rash on the lower extremities before, it went away with OTCs. The rash came back 2 weeks ago,+ itching.mild scaliness No other similar rashes anywhere.   Review of Systems Denies systemic symptoms like fever or chills No exposure to animals, does not have pets at home.  Past Medical History:  Diagnosis Date  . Allergy   . Asthma   . Headache(784.0)   . Palpitations   . Pneumonia     Past Surgical History:  Procedure Laterality Date  . COLONOSCOPY    . COLPOSCOPY  04/28/2016  . OVARIAN CYST REMOVAL    . POLYPECTOMY      Social History   Socioeconomic History  . Marital status: Married    Spouse name: Not on file  . Number of children: 0  . Years of education: Not on file  . Highest education level: Not on file  Social Needs  . Financial resource strain: Not on file  . Food insecurity - worry: Not on file  . Food insecurity - inability: Not on file  . Transportation needs - medical: Not on file  . Transportation needs - non-medical: Not on file  Occupational History  . Occupation: Scientist, water quality [employer]  Tobacco Use  . Smoking status: Never Smoker  . Smokeless tobacco: Never Used  Substance and Sexual Activity  . Alcohol use: Yes    Alcohol/week: 0.0 oz    Comment: occasional  . Drug use: No  . Sexual activity: Not on file  Other Topics Concern  . Not on file  Social History Narrative   From [place] as of 02/06/2017   No Known Allergies     Medication List        Accurate as of 02/06/17 11:49 AM. Always use your most recent med list.          cetirizine 10 MG tablet Commonly known as:  ZYRTEC Take 10 mg by mouth daily as needed for allergies.   CO Q 10 PO Take 1 capsule by mouth daily.   dicyclomine 10 MG capsule Commonly known as:  BENTYL Take  1 capsule (10 mg total) by mouth 3 (three) times daily as needed for spasms.   FISH OIL PO Take 1 capsule by mouth daily.   ibuprofen 200 MG tablet Commonly known as:  ADVIL,MOTRIN Take 200 mg by mouth every 6 (six) hours as needed.   omeprazole 40 MG capsule Commonly known as:  PRILOSEC Take 1 capsule (40 mg total) by mouth daily.   simethicone 80 MG chewable tablet Commonly known as:  GAS-X Chew 1 tablet (80 mg total) by mouth 4 (four) times daily as needed.          Objective:   Physical Exam  Skin:      BP 108/68 (BP Location: Left Arm, Patient Position: Sitting, Cuff Size: Small)   Pulse 69   Temp 97.9 F (36.6 C) (Oral)   Resp 14   Ht 4\' 11"  (1.499 m)   Wt 116 lb 8 oz (52.8 kg)   SpO2 99%   BMI 23.53 kg/m  General:   Well developed, well nourished . NAD.  HEENT:  Normocephalic . Face symmetric, atraumatic  Neurologic:  alert & oriented X3.  Speech normal,  gait appropriate for age and unassisted Psych--  Cognition and judgment appear intact.  Cooperative with normal attention span and concentration.  Behavior appropriate. No anxious or depressed appearing.      Assessment & Plan:    Assessment Chest pain, dizziness: Normal ETT echo and event monitor 2011. ETT again 04-2014: Normal, good exercise capacity Hematuria: per patient previous urology w/u (-). Dr McDermoth Colon polyps  Menopause since ~ age 7 H/o hair  loss, saw dermatology, biopsy done.  PLAN: Rash: Likely fungal, recommend ketoconazole and hydrocortisone, call if no better.  See AVS

## 2017-02-07 NOTE — Assessment & Plan Note (Signed)
Rash: Likely fungal, recommend ketoconazole and hydrocortisone, call if no better.  See AVS

## 2017-04-06 ENCOUNTER — Telehealth: Payer: Self-pay

## 2017-04-06 NOTE — Telephone Encounter (Signed)
Copied from [place]. Topic: Inquiry >> Apr 05, 2017  4:35 PM Patrice Paradise wrote: Reason for CRM: Patient husband Jori Moll called to see if she got the flu shot when she came in to see Dr. Larose Kells on 02/06/17. He would like a call back to verify if she did or did not have the flu shot on this day.

## 2017-04-06 NOTE — Telephone Encounter (Signed)
Spoke w/ Edd Arbour, Pt's spouse, informed that Pt did not have flu vaccine in December 2018.

## 2017-07-10 ENCOUNTER — Ambulatory Visit (INDEPENDENT_AMBULATORY_CARE_PROVIDER_SITE_OTHER): Payer: 59 | Admitting: Internal Medicine

## 2017-07-10 ENCOUNTER — Encounter: Payer: Self-pay | Admitting: Internal Medicine

## 2017-07-10 VITALS — BP 116/78 | HR 71 | Temp 97.8°F | Resp 14 | Ht 59.0 in | Wt 118.1 lb

## 2017-07-10 DIAGNOSIS — E559 Vitamin D deficiency, unspecified: Secondary | ICD-10-CM

## 2017-07-10 DIAGNOSIS — K219 Gastro-esophageal reflux disease without esophagitis: Secondary | ICD-10-CM | POA: Diagnosis not present

## 2017-07-10 DIAGNOSIS — Z0000 Encounter for general adult medical examination without abnormal findings: Secondary | ICD-10-CM | POA: Diagnosis not present

## 2017-07-10 DIAGNOSIS — F411 Generalized anxiety disorder: Secondary | ICD-10-CM

## 2017-07-10 DIAGNOSIS — Z780 Asymptomatic menopausal state: Secondary | ICD-10-CM

## 2017-07-10 NOTE — Assessment & Plan Note (Addendum)
-  Td 2013 per pt, no documentation. - Female care-- per gyn, PAP 04/2016 ; MMG 04/2016, to see gyn next week  - CCS- Cscope 2011, Dr Deatra Ina, diverticuli  - menopausal at age 57 , RX DEXA - Labs : CMP, FLP, CBC, TSH, vitamin D. - Diet and exercise discussed, she is actually doing well.

## 2017-07-10 NOTE — Progress Notes (Signed)
Pre visit review using our clinic review tool, if applicable. No additional management support is needed unless otherwise documented below in the visit note. 

## 2017-07-10 NOTE — Patient Instructions (Signed)
GO TO THE LAB : Get the blood work     GO TO THE FRONT DESK Schedule your next appointment for a   physical exam in 1 year  Continue taking calcium, vitamin D over-the-counter.  Consider see a counselor

## 2017-07-10 NOTE — Progress Notes (Signed)
Subjective:    Patient ID: [name], female    DOB: 1960-11-03, 57 y.o.   MRN: [id number]  DOS:  07/10/2017 Type of visit - description : cpx Interval history: Has few concerns, see below.   Review of Systems c/o stress, occasional depression, anxiety.  No suicidal ideas. Symptoms related to family issues, her family lives in [place], she takes care of a number of things for them over the phone and that stress her out. Denies fever, chills, weight loss, chest pain or difficulty breathing. Continue with some dyspepsia and ill-defined abdominal pain but no blood in the stools. Not taking GI medications consistently.  Other than above, a 14 point review of systems is negative    Past Medical History:  Diagnosis Date  . Allergy   . Asthma   . Headache(784.0)   . Palpitations   . Pneumonia     Past Surgical History:  Procedure Laterality Date  . COLONOSCOPY    . COLPOSCOPY  04/28/2016  . OVARIAN CYST REMOVAL    . POLYPECTOMY      Social History   Socioeconomic History  . Marital status: Married    Spouse name: Not on file  . Number of children: 0  . Years of education: Not on file  . Highest education level: Not on file  Occupational History  . 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 Financial resource strain: Not on file  . Food insecurity:    Worry: Not on file    Inability: Not on file  . Transportation needs:    Medical: Not on file    Non-medical: Not on file  Tobacco Use  . Smoking status: Never Smoker  . Smokeless tobacco: Never Used  Substance and Sexual Activity  . Alcohol use: Yes    Alcohol/week: 0.0 oz    Comment: occasional  . Drug use: No  . Sexual activity: Not on file  Lifestyle  . Physical activity:    Days per week: Not on file    Minutes per session: Not on file  . Stress: Not on file  Relationships  . Social connections:    Talks on phone: Not on file    Gets together: Not on file    Attends religious service: Not on file    Active member of club or organization: Not on file    Attends meetings of clubs or organizations: Not on file    Relationship status: Not on file  . Intimate partner violence:    Fear of current or ex partner: Not on file    Emotionally abused: Not on file    Physically abused: Not on file    Forced sexual activity: Not on file  Other Topics Concern  . Not on file  Social History Narrative   From [place]     Family History  Problem Relation Age of Onset  . Hypertension Mother   . High Cholesterol Mother   . Diabetes Paternal Grandfather   . Atrial fibrillation Father        pacemaker  . Other Father        blood clotting disorder  . Hyperlipidemia Brother   . Heart disease Unknown   . Hypertension Unknown   . Hyperlipidemia Unknown   . Hypertension Unknown   . Colon cancer Neg Hx   . Breast cancer Neg Hx      Allergies as of 07/10/2017   No Known Allergies  Medication List        Accurate as of 07/10/17 11:59 PM. Always use your most recent med list.          calcium citrate-vitamin D 315-200 MG-UNIT tablet Commonly known as:  CITRACAL+D Take 1 tablet by mouth 2 (two) times daily.   dicyclomine 10 MG capsule Commonly known as:  BENTYL Take 1 capsule (10 mg total) by mouth 3 (three) times daily as needed for spasms.   ibuprofen 200 MG tablet Commonly known as:  ADVIL,MOTRIN Take 200 mg by mouth every 6 (six) hours as needed.   omeprazole 40 MG capsule Commonly known as:  PRILOSEC Take 1 capsule (40 mg total) by mouth daily.          Objective:   Physical Exam BP 116/78 (BP Location: Left Arm, Patient Position: Sitting, Cuff Size: Small)   Pulse 71   Temp 97.8 F (36.6 C) (Oral)   Resp 14   Ht 4\' 11"  (1.499 m)   Wt 118 lb 2 oz (53.6 kg)   SpO2 97%   BMI 23.86 kg/m  General:   Well developed, well nourished . NAD.  Neck: No  thyromegaly  HEENT:  Normocephalic . Face symmetric, atraumatic Lungs:  CTA B Normal respiratory  effort, no intercostal retractions, no accessory muscle use. Heart: RRR,  no murmur.  No pretibial edema bilaterally  Abdomen:  Not distended, soft, non-tender. No rebound or rigidity.   Skin: Exposed areas without rash. Not pale. Not jaundice Neurologic:  alert & oriented X3.  Speech normal, gait appropriate for age and unassisted Strength symmetric and appropriate for age.  Psych: Cognition and judgment appear intact.  Cooperative with normal attention span and concentration.  Behavior appropriate. Tearful when we talk about her stress.      Assessment & Plan:   Assessment  ( new patient, 02/2016) Chest pain, dizziness: Normal ETT echo and event monitor 2011. ETT again 04-2014: Normal, good exercise capacity Hematuria: per patient previous urology w/u (-). Dr McDermoth Colon polyps  Menopause since ~ age 87 H/o hair  loss, saw dermatology, biopsy done. GERD-Dyspepsia- cholelithiasis  : rx bentyl  PLAN: Had several "home screening" testing on 06/14/2016, she shared the results: Negative for carotid disease, atrial fibrillation or peripheral vascular disease.  Screening did show osteoporosis and vitamin D deficiency. Checking labs and a DEXA GERD, dyspepsia: On and off symptoms, not taking medications consistently, recommend to call GI.  They were talking about possibly doing EGD. Anxiety, stress, mild depression: counseled today, provided extensive listening therapy.  Rec to see a counselor, information provided, pt is not ready for medication. RTC 1 year  Today, I spent more than 15 min with the patient (in addition to CPX): >50% of the time counseling regards anxiety, providing listening therapy and advise  Also: -reviewing the chart and labs ordered by other providers  -coordinating his care

## 2017-07-11 DIAGNOSIS — K219 Gastro-esophageal reflux disease without esophagitis: Secondary | ICD-10-CM | POA: Insufficient documentation

## 2017-07-11 LAB — CBC WITH DIFFERENTIAL/PLATELET
BASOS PCT: 0.4 % (ref 0.0–3.0)
Basophils Absolute: 0 10*3/uL (ref 0.0–0.1)
EOS PCT: 7.8 % — AB (ref 0.0–5.0)
Eosinophils Absolute: 0.5 10*3/uL (ref 0.0–0.7)
HCT: 36.8 % (ref 36.0–46.0)
Hemoglobin: 12.4 g/dL (ref 12.0–15.0)
LYMPHS ABS: 2.5 10*3/uL (ref 0.7–4.0)
Lymphocytes Relative: 37.9 % (ref 12.0–46.0)
MCHC: 33.9 g/dL (ref 30.0–36.0)
MCV: 92 fl (ref 78.0–100.0)
MONO ABS: 0.4 10*3/uL (ref 0.1–1.0)
MONOS PCT: 5.4 % (ref 3.0–12.0)
NEUTROS ABS: 3.2 10*3/uL (ref 1.4–7.7)
NEUTROS PCT: 48.5 % (ref 43.0–77.0)
PLATELETS: 261 10*3/uL (ref 150.0–400.0)
RBC: 3.99 Mil/uL (ref 3.87–5.11)
RDW: 13.5 % (ref 11.5–15.5)
WBC: 6.6 10*3/uL (ref 4.0–10.5)

## 2017-07-11 LAB — LIPID PANEL
Cholesterol: 208 mg/dL — ABNORMAL HIGH (ref 0–200)
HDL: 60.6 mg/dL (ref >39.00)
LDL Cholesterol: 123 mg/dL — ABNORMAL HIGH (ref 0–99)
NonHDL: 147.86
TRIGLYCERIDES: 124 mg/dL (ref 0.0–149.0)
Total CHOL/HDL Ratio: 3
VLDL: 24.8 mg/dL (ref 0.0–40.0)

## 2017-07-11 LAB — COMPREHENSIVE METABOLIC PANEL
ALK PHOS: 74 U/L (ref 39–117)
ALT: 11 U/L (ref 0–35)
AST: 16 U/L (ref 0–37)
Albumin: 4 g/dL (ref 3.5–5.2)
BILIRUBIN TOTAL: 0.5 mg/dL (ref 0.2–1.2)
BUN: 12 mg/dL (ref 6–23)
CALCIUM: 8.7 mg/dL (ref 8.4–10.5)
CO2: 28 mEq/L (ref 19–32)
Chloride: 104 mEq/L (ref 96–112)
Creatinine, Ser: 0.53 mg/dL (ref 0.40–1.20)
GFR: 126.25 mL/min (ref >60.00)
Glucose, Bld: 77 mg/dL (ref 70–99)
POTASSIUM: 3.4 meq/L — AB (ref 3.5–5.1)
Sodium: 139 mEq/L (ref 135–145)
TOTAL PROTEIN: 7 g/dL (ref 6.0–8.3)

## 2017-07-11 LAB — TSH: TSH: 1.17 u[IU]/mL (ref 0.35–4.50)

## 2017-07-11 NOTE — Assessment & Plan Note (Signed)
Had several "home screening" testing on 06/14/2016, she shared the results: Negative for carotid disease, atrial fibrillation or peripheral vascular disease.  Screening did show osteoporosis and vitamin D deficiency. Checking labs and a DEXA GERD, dyspepsia: On and off symptoms, not taking medications consistently, recommend to call GI.  They were talking about possibly doing EGD. Anxiety, stress, mild depression: counseled today, provided extensive listening therapy.  Rec to see a counselor, information provided, pt is not ready for medication. RTC 1 year

## 2017-07-12 LAB — VITAMIN D 1,25 DIHYDROXY
VITAMIN D 1, 25 (OH) TOTAL: 54 pg/mL (ref 18–72)
Vitamin D3 1, 25 (OH)2: 54 pg/mL

## 2017-07-13 ENCOUNTER — Ambulatory Visit (HOSPITAL_BASED_OUTPATIENT_CLINIC_OR_DEPARTMENT_OTHER)
Admission: RE | Admit: 2017-07-13 | Discharge: 2017-07-13 | Disposition: A | Discharge status: Home / Self Care | Payer: 59 | Source: Ambulatory Visit | Attending: Internal Medicine | Admitting: Internal Medicine

## 2017-07-13 DIAGNOSIS — M810 Age-related osteoporosis without current pathological fracture: Secondary | ICD-10-CM | POA: Diagnosis not present

## 2017-07-13 DIAGNOSIS — Z13820 Encounter for screening for osteoporosis: Secondary | ICD-10-CM | POA: Diagnosis not present

## 2017-07-13 DIAGNOSIS — Z780 Asymptomatic menopausal state: Secondary | ICD-10-CM | POA: Insufficient documentation

## 2017-07-17 MED ORDER — RISEDRONATE SODIUM 150 MG PO TABS: 150.0000 mg | ORAL_TABLET | ORAL | 12 refills | Status: DC

## 2017-07-17 NOTE — Addendum Note (Signed)
Addended byDamita Dunnings D on: 07/17/2017 08:05 AM   Modules accepted: Orders

## 2017-07-18 ENCOUNTER — Telehealth: Payer: Self-pay | Admitting: Internal Medicine

## 2017-07-18 NOTE — Telephone Encounter (Signed)
Please advise 

## 2017-07-18 NOTE — Telephone Encounter (Signed)
Copied from [place] [id number]. Topic: Quick Communication - See Telephone Encounter >> Jul 18, 2017  1:46 PM Aurelio Brash B wrote: CRM for notification. See Telephone encounter for: 07/18/17.  PT says risedronate (ACTONEL) 150 MG tablet  is not available ,  she checked with multiple pharmacies--  she wants to know if she can be prescribed a different medication    Kristopher Oppen[facility] [address], [place] [phone] (Phone) [phone] (Fax)

## 2017-07-20 ENCOUNTER — Ambulatory Visit: Payer: Self-pay | Admitting: Family Medicine

## 2017-07-21 NOTE — Telephone Encounter (Signed)
Try Boniva 150 mg once a month

## 2017-07-23 MED ORDER — IBANDRONATE SODIUM 150 MG PO TABS: 150.0000 mg | ORAL_TABLET | ORAL | 12 refills | Status: DC

## 2017-07-23 NOTE — Telephone Encounter (Signed)
Rx sent 

## 2017-07-27 ENCOUNTER — Telehealth: Payer: Self-pay | Admitting: Internal Medicine

## 2017-07-27 NOTE — Telephone Encounter (Signed)
Copied from [place] [id number]. Topic: Quick Communication - See Telephone Encounter >> Jul 27, 2017  3:08 PM Rosalin Hawking wrote: CRM for notification. See Telephone encounter for: 07/27/17.    Pt's family member dropped off document to be filled out by provider (1 page [facility] - Physician Result Form) Pt would like to be called when document ready to pick up at tel [phone]. Document at front office tray under providers name.

## 2017-08-03 NOTE — Telephone Encounter (Signed)
Completed as much as possible; forwarded to provider/SLS 05/31

## 2017-08-03 NOTE — Telephone Encounter (Signed)
Pt called to see if the document is ready to pick up

## 2017-08-06 ENCOUNTER — Ambulatory Visit: Payer: Self-pay | Admitting: Physician Assistant

## 2017-08-06 ENCOUNTER — Encounter (HOSPITAL_COMMUNITY): Payer: Self-pay | Admitting: Emergency Medicine

## 2017-08-06 ENCOUNTER — Ambulatory Visit (HOSPITAL_COMMUNITY)
Admission: EM | Admit: 2017-08-06 | Discharge: 2017-08-06 | Disposition: A | Discharge status: Home / Self Care | Payer: 59 | Attending: Family Medicine | Admitting: Family Medicine

## 2017-08-06 DIAGNOSIS — Z79899 Other long term (current) drug therapy: Secondary | ICD-10-CM | POA: Insufficient documentation

## 2017-08-06 DIAGNOSIS — Z8249 Family history of ischemic heart disease and other diseases of the circulatory system: Secondary | ICD-10-CM | POA: Diagnosis not present

## 2017-08-06 DIAGNOSIS — R3129 Other microscopic hematuria: Secondary | ICD-10-CM | POA: Diagnosis not present

## 2017-08-06 DIAGNOSIS — R319 Hematuria, unspecified: Secondary | ICD-10-CM | POA: Diagnosis not present

## 2017-08-06 DIAGNOSIS — R197 Diarrhea, unspecified: Secondary | ICD-10-CM | POA: Diagnosis not present

## 2017-08-06 DIAGNOSIS — K219 Gastro-esophageal reflux disease without esophagitis: Secondary | ICD-10-CM | POA: Insufficient documentation

## 2017-08-06 DIAGNOSIS — R109 Unspecified abdominal pain: Secondary | ICD-10-CM | POA: Diagnosis not present

## 2017-08-06 LAB — C DIFFICILE QUICK SCREEN W PCR REFLEX
C DIFFICILE (CDIFF) TOXIN: NEGATIVE
C Diff antigen: NEGATIVE
C Diff interpretation: NOT DETECTED

## 2017-08-06 LAB — POCT URINALYSIS DIP (DEVICE)
BILIRUBIN URINE: NEGATIVE
Glucose, UA: NEGATIVE mg/dL
Ketones, ur: NEGATIVE mg/dL
LEUKOCYTES UA: NEGATIVE
Nitrite: NEGATIVE
Protein, ur: NEGATIVE mg/dL
Specific Gravity, Urine: 1.015 (ref 1.005–1.030)
Urobilinogen, UA: 0.2 mg/dL (ref 0.0–1.0)
pH: 6.5 (ref 5.0–8.0)

## 2017-08-06 NOTE — Discharge Instructions (Signed)
C diff testing sent.  Bland diet for now, advance as tolerated.  As discussed, microscopic blood in urine, follow-up with urology for reevaluation.  If experiencing worsening symptoms, worsening abdominal pain, nausea, vomiting, fever, blood in stool, follow-up for reevaluation.

## 2017-08-06 NOTE — ED Provider Notes (Signed)
[facility]    CSN: [id number] Arrival date & time: 08/06/17  1722     History   Chief Complaint Chief Complaint  Patient presents with  . Diarrhea    HPI [name] is a 57 y.o. female.   56 year old female comes in for C. difficile testing.  States she started taking amoxicillin back in April for dental infection, finished medication without problems, however, dental infection did not resolve.  She was put on clindamycin in May, started having diarrhea shortly after.  She was told to stop the clindamycin, and diarrhea resolved.  States she restarted on the clindamycin, and diarrhea restarted, and was told to discontinue again by the dentist on 07/27/2017.  States diarrhea returned today.  Had loose performed diarrhea first, but now with completely watery stool.  Denies fever, chills, night sweats.  Intermittent abdominal cramping.  Denies nausea, vomiting.  She is unsure of urinary symptoms.  Patient would like to get tested for C. difficile.     Past Medical History:  Diagnosis Date  . Allergy   . Asthma   . Headache(784.0)   . Palpitations   . Pneumonia     Patient Active Problem List   Diagnosis Date Noted  . GERD (gastroesophageal reflux disease) 07/11/2017  . Annual physical exam 03/20/2016  . PCP NOTES >>>>>>>>>>> 02/20/2016  . Anxiety state 02/20/2016  . Microscopic hematuria 01/11/2016  . Nocturia 01/11/2016  . Chest pain 03/03/2014  . Palpitations 03/03/2014  . Asthma 07/28/2009    Past Surgical History:  Procedure Laterality Date  . COLONOSCOPY    . COLPOSCOPY  04/28/2016  . OVARIAN CYST REMOVAL    . POLYPECTOMY      OB History   None      Home Medications    Prior to Admission medications   Medication Sig Start Date End Date Taking? Authorizing Provider  calcium citrate-vitamin D (CITRACAL+D) 315-200 MG-UNIT tablet Take 1 tablet by mouth 2 (two) times daily.    [provider]  dicyclomine (BENTYL) 10 MG capsule Take  1 capsule (10 mg total) by mouth 3 (three) times daily as needed for spasms. 08/30/16   Ladene Artist, MD  ibandronate (BONIVA) 150 MG tablet Take 1 tablet (150 mg total) by mouth every 30 (thirty) days. Take in the morning with a full glass of water, on an empty stomach 07/23/17   Colon Branch, MD  ibuprofen (ADVIL,MOTRIN) 200 MG tablet Take 200 mg by mouth every 6 (six) hours as needed.    [provider]  omeprazole (PRILOSEC) 40 MG capsule Take 1 capsule (40 mg total) by mouth daily. Patient not taking: Reported on 07/10/2017 08/16/16   Ladene Artist, MD  risedronate (ACTONEL) 150 MG tablet Take 1 tablet (150 mg total) by mouth every 30 (thirty) days. with water on empty stomach, nothing by mouth or lie down for next 30 minutes. 07/17/17   Colon Branch, MD    Family History Family History  Problem Relation Age of Onset  . Hypertension Mother   . High Cholesterol Mother   . Diabetes Paternal Grandfather   . Atrial fibrillation Father        pacemaker  . Other Father        blood clotting disorder  . Hyperlipidemia Brother   . Heart disease Unknown   . Hypertension Unknown   . Hyperlipidemia Unknown   . Hypertension Unknown   . Colon cancer Neg Hx   . Breast cancer  Neg Hx     Social History Social History   Tobacco Use  . Smoking status: Never Smoker  . Smokeless tobacco: Never Used  Substance Use Topics  . Alcohol use: Yes    Alcohol/week: 0.0 oz    Comment: occasional  . Drug use: No     Allergies   Patient has no known allergies.   Review of Systems Review of Systems  Reason unable to perform ROS: See HPI as above.     Physical Exam Triage Vital Signs ED Triage Vitals [08/06/17 1818]  Enc Vitals Group     BP 93/60     Pulse Rate 71     Resp 18     Temp 98.7 F (37.1 C)     Temp Source Oral     SpO2 100 %     Weight      Height      Head Circumference      Peak Flow      Pain Score      Pain Loc      Pain Edu?      Excl. in [place]?    No  data found.  Updated Vital Signs BP 93/60 (BP Location: Left Arm)   Pulse 71   Temp 98.7 F (37.1 C) (Oral)   Resp 18   SpO2 100%   Physical Exam  Constitutional: She is oriented to person, place, and time. She appears well-developed and well-nourished. No distress.  HENT:  Head: Normocephalic and atraumatic.  Eyes: Pupils are equal, round, and reactive to light. Conjunctivae are normal.  Cardiovascular: Normal rate, regular rhythm and normal heart sounds. Exam reveals no gallop and no friction rub.  No murmur heard. Pulmonary/Chest: Effort normal and breath sounds normal. She has no wheezes. She has no rales.  Abdominal: Soft. Bowel sounds are normal. She exhibits no mass. There is no tenderness. There is no rebound, no guarding and no CVA tenderness.  Neurological: She is alert and oriented to person, place, and time.  Skin: Skin is warm and dry.  Psychiatric: She has a normal mood and affect. Her behavior is normal. Judgment normal.    UC Treatments / Results  Labs (all labs ordered are listed, but only abnormal results are displayed) Labs Reviewed  POCT URINALYSIS DIP (DEVICE) - Abnormal; Notable for the following components:      Result Value   Hgb urine dipstick MODERATE (*)    All other components within normal limits  C DIFFICILE QUICK SCREEN W PCR REFLEX    EKG None  Radiology No results found.  Procedures Procedures (including critical care time)  Medications Ordered in UC Medications - No data to display  Initial Impression / Assessment and Plan / UC Course  I have reviewed the triage vital signs and the nursing notes.  Pertinent labs & imaging results that were available during my care of the patient were reviewed by me and considered in my medical decision making (see chart for details).    Discussed urine results.  Patient with history of microscopic hematuria, has been evaluated by urology in the past.  States has been 3 to 4 years since the last  saw urology.  He will follow-up with urology for reevaluation needed.  Stool culture collected.  Push fluids for now, with bland diet, advance as tolerated.  Return precautions given.  Patient expresses understanding and agrees to plan.  Final Clinical Impressions(s) / UC Diagnoses   Final diagnoses:  Diarrhea, unspecified type  Hematuria, unspecified type    ED Prescriptions    None        Arturo Morton 08/06/17 2123

## 2017-08-06 NOTE — ED Triage Notes (Signed)
Pt here for diarrhea since taking antibiotics 3 weeks ago; diarrhea has been on and off

## 2017-08-10 ENCOUNTER — Telehealth: Payer: Self-pay

## 2017-08-10 ENCOUNTER — Encounter: Payer: Self-pay | Admitting: Internal Medicine

## 2017-08-10 NOTE — Telephone Encounter (Signed)
Copied from [place] [id number]. Topic: Quick Communication - See Telephone Encounter >> Jul 27, 2017  3:08 PM Rosalin Hawking wrote: CRM for notification. See Telephone encounter for: 07/27/17. >> Aug 10, 2017 12:59 PM Keene Breath wrote: Patient called again to check on status of paperwork.  See TE from 07/27/17.

## 2017-08-10 NOTE — Telephone Encounter (Signed)
Ivin Booty-- did this come back to you. Pt is asking about status of form?

## 2017-08-14 NOTE — Telephone Encounter (Signed)
I have not seen form.

## 2017-08-14 NOTE — Telephone Encounter (Signed)
[name] located form and forwarded to me. I spoke with patient and they will p/u Fri, 08/17/17 during regular business hours/SLS 06/11

## 2017-08-14 NOTE — Telephone Encounter (Signed)
Please see my 08/03/17 note; will have to check with [facility] today and have her check with Dr. Larose Kells, since she usually faxes their paperwork and I don't normally get their paperwork returned/SLS 06/11 Have you seen these forms?

## 2017-08-31 ENCOUNTER — Telehealth: Payer: Self-pay | Admitting: Internal Medicine

## 2017-08-31 NOTE — Telephone Encounter (Signed)
Copied from [place] [phone]. Topic: General - Other >> Aug 31, 2017  9:55 AM Mylinda Latina, NT wrote: Reason for CRM:  Patient husband called and states that the patient was suppose to get a Dexa Scan done. That test was completed but he received a bill for over $400 dollars. He states he thought she was going to get a screening Dexa Scan but the diagnoses code suggested that it was Diagnostic. He is wanting a call back to make sure the right order was put in and that the diagnoses code match the order.   He is also wanting a print out of the [place] from 07/10/17. Please call patient with this information and also to let them know when to pick up that information CB# [phone] can leave a detailed message if no answer.

## 2017-09-07 NOTE — Telephone Encounter (Signed)
Copied from [place] [phone]. Topic: Quick Communication - See Telephone Encounter >> Sep 07, 2017  3:13 PM Percell Belt A wrote: CRM for notification. See Telephone encounter for: 09/07/17.  He is also wanting a print out of the [place] from 07/10/17. Please call patient with this information and also to let them know when to pick up that information CB# [phone] can leave a detailed message if no answer.    Pt called again about this gettting a print out of the office visit

## 2017-09-07 NOTE — Telephone Encounter (Signed)
OV notes printed. Kennyth Lose- please inform Pt that OV note from 07/10/2017 placed at front desk. Ready of pick up.

## 2017-09-10 NOTE — Telephone Encounter (Signed)
LVM in spanish on cell phone for pt to be informed to pick up OV documents at our office, also LVM on home phone in [place].

## 2017-09-18 LAB — HM PAP SMEAR

## 2017-10-15 LAB — HM MAMMOGRAPHY

## 2017-10-16 ENCOUNTER — Encounter: Payer: Self-pay | Admitting: Internal Medicine

## 2017-11-09 ENCOUNTER — Encounter

## 2017-11-09 ENCOUNTER — Encounter: Payer: 59 | Admitting: Internal Medicine

## 2018-01-01 ENCOUNTER — Ambulatory Visit: Payer: 59 | Admitting: Gastroenterology

## 2018-01-11 ENCOUNTER — Encounter: Payer: Self-pay | Admitting: Gastroenterology

## 2018-01-11 ENCOUNTER — Encounter

## 2018-01-11 ENCOUNTER — Ambulatory Visit (INDEPENDENT_AMBULATORY_CARE_PROVIDER_SITE_OTHER): Payer: 59 | Admitting: Gastroenterology

## 2018-01-11 VITALS — BP 102/60 | HR 68 | Ht 60.0 in | Wt 115.4 lb

## 2018-01-11 DIAGNOSIS — R140 Abdominal distension (gaseous): Secondary | ICD-10-CM

## 2018-01-11 DIAGNOSIS — R1084 Generalized abdominal pain: Secondary | ICD-10-CM | POA: Diagnosis not present

## 2018-01-11 MED ORDER — FAMOTIDINE 20 MG PO TABS: 20.0000 mg | ORAL_TABLET | Freq: Every day | ORAL | 11 refills | Status: DC

## 2018-01-11 MED ORDER — DICYCLOMINE HCL 10 MG PO CAPS: 10.0000 mg | ORAL_CAPSULE | Freq: Three times a day (TID) | ORAL | 11 refills | Status: DC | PRN

## 2018-01-11 NOTE — Patient Instructions (Addendum)
We have sent the following medications to your pharmacy for you to pick up at your convenience: famotidine and bentyl.   Patient advised to avoid spicy, acidic, citrus, chocolate, mints, fruit and fruit juices.  Limit the intake of caffeine, alcohol and Soda.  Don't exercise too soon after eating.  Don't lie down within 3-4 hours of eating.  Elevate the head of your bed.  You can take over the counter Gas-x four times a day as needed.   Thank you for choosing me and [facility].  Pricilla Riffle. Dagoberto Ligas., MD., Marval Regal

## 2018-01-11 NOTE — Progress Notes (Signed)
    History of Present Illness: This is a 58 year old with gas, bloating, flatulence, belching, dyspepsia, abdominal pain.  She is accompanied by her husband.  Abdominal pain and cramping related to gas and improved with belching or flatus.  She noted the dicyclomine was helpful for her abdominal pain.  She recently tried Gas-X which has helped her symptoms as well.  She thinks omeprazole helped her belching and dyspeptic symptoms but she prefers not to take omeprazole as she has been diagnosed with osteoporosis.  We reviewed the results of her prior abdominal ultrasound, her prior colonoscopy and her blood work from May 2019.  Current Medications, Allergies, Past Medical History, Past Surgical History, Family History and Social History were reviewed in [employer] record.  Physical Exam: General: Well developed, well nourished, no acute distress Head: Normocephalic and atraumatic Eyes:  sclerae anicteric, EOMI Ears: Normal auditory acuity Mouth: No deformity or lesions Lungs: Clear throughout to auscultation Heart: Regular rate and rhythm; no murmurs, rubs or bruits Abdomen: Soft, non tender and non distended. No masses, hepatosplenomegaly or hernias noted. Normal Bowel sounds Rectal: Not done Musculoskeletal: Symmetrical with no gross deformities  Pulses:  Normal pulses noted Extremities: No clubbing, cyanosis, edema or deformities noted Neurological: Alert oriented x 4, grossly nonfocal Psychological:  Alert and cooperative.  Anxious   Assessment and Recommendations:  1. Gas, bloating, belching, flatus, dyspepsia, abdominal pain.  Avoid foods that exacerbate symptoms.  Follow low gas diet.  Follow antireflux measures.  Begin famotidine 20 mg daily.  Dicyclomine 10 mg 3 times daily as needed abdominal pain.  Gas-X 4 times daily as needed if symptoms not improved over the next month she is advised to call for further advice.  2. CRC screening, average risk.  A  10-year interval screening colonoscopy is in June 2021.  I spent 15 minutes of face-to-face time with the patient. Greater than 50% of the time was spent counseling and coordinating care.

## 2018-07-15 ENCOUNTER — Telehealth: Payer: Self-pay | Admitting: Internal Medicine

## 2018-07-15 NOTE — Telephone Encounter (Signed)
LVM for pt to change CPE appt on 07-19-2018 for it to be in the future but that pt can have VOV for any other situation or fu appt.

## 2018-07-19 ENCOUNTER — Encounter: Payer: 59 | Admitting: Internal Medicine

## 2018-07-28 ENCOUNTER — Other Ambulatory Visit: Payer: Self-pay | Admitting: Internal Medicine

## 2018-08-05 ENCOUNTER — Telehealth: Payer: Self-pay | Admitting: *Deleted

## 2018-08-05 DIAGNOSIS — M810 Age-related osteoporosis without current pathological fracture: Secondary | ICD-10-CM

## 2018-08-05 NOTE — Telephone Encounter (Signed)
Outside Referral placed. Need to look at updated schedule requirements to schedule.

## 2018-08-05 NOTE — Telephone Encounter (Signed)
Advise patient: Due for a CPX, schedule at her convenience Okay to refer to endocrinology for management of osteoporosis as patient requested.

## 2018-08-05 NOTE — Telephone Encounter (Signed)
Copied from [place] [phone]. Topic: Referral - Request for Referral >> Aug 02, 2018  4:20 PM Yvette Rack wrote: Has patient seen PCP for this complaint? yes  *If NO, is insurance requiring patient see PCP for this issue before PCP can refer them? Referral for which specialty: Endocrinologist  Preferred provider/office: Dr. Delane Ginger @ [facility]   fax# [phone] Reason for referral: Osteoporosis

## 2018-08-07 NOTE — Telephone Encounter (Signed)
Pt would like call as to why her rx was denied

## 2018-08-09 ENCOUNTER — Other Ambulatory Visit: Payer: Self-pay

## 2018-08-09 ENCOUNTER — Ambulatory Visit (INDEPENDENT_AMBULATORY_CARE_PROVIDER_SITE_OTHER): Payer: 59 | Admitting: Internal Medicine

## 2018-08-09 ENCOUNTER — Encounter: Payer: Self-pay | Admitting: Internal Medicine

## 2018-08-09 DIAGNOSIS — M810 Age-related osteoporosis without current pathological fracture: Secondary | ICD-10-CM

## 2018-08-09 MED ORDER — IBANDRONATE SODIUM 150 MG PO TABS: 150.0000 mg | ORAL_TABLET | ORAL | 12 refills | Status: DC

## 2018-08-09 NOTE — Progress Notes (Signed)
Subjective:    Patient ID: [name], female    DOB: 1960-05-03, 58 y.o.   MRN: [id number]  DOS:  08/09/2018 Type of visit - description: Virtual Visit via Video Note  I connected with@ on 08/09/18 at  8:00 AM EDT by a video enabled telemedicine application and verified that I am speaking with the correct person using two identifiers.   THIS ENCOUNTER IS A VIRTUAL VISIT DUE TO COVID-19 - PATIENT WAS NOT SEEN IN THE OFFICE. PATIENT HAS CONSENTED TO VIRTUAL VISIT / TELEMEDICINE VISIT   Location of patient: home  Location of provider: office  I discussed the limitations of evaluation and management by telemedicine and the availability of in person appointments. The patient expressed understanding and agreed to proceed.  History of Present Illness: Follow-up Osteoporosis: Good compliance with Boniva up until 2 months ago when she ran out's, needs a refill. She continues to have ill defined muscle ache described as cramps, most noticeable at night and and the lower extremity. She wonders if it is from [place]. Good compliance with calcium vitamin D   Also concerned about the osteoporosis diagnosis, wonders why that is happening   Review of Systems No fever, chills. No weight loss, her weight fluctuates. Occasional headaches She remains active  Past Medical History:  Diagnosis Date  . Allergy   . Asthma   . Headache(784.0)   . Palpitations   . Pneumonia     Past Surgical History:  Procedure Laterality Date  . COLONOSCOPY    . COLPOSCOPY  04/28/2016  . OVARIAN CYST REMOVAL    . POLYPECTOMY      Social History   Socioeconomic History  . Marital status: Married    Spouse name: Not on file  . Number of children: 0  . Years of education: Not on file  . Highest education level: Not on file  Occupational History  . 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 Financial resource strain: Not on file  . Food insecurity:    Worry: Not on file    Inability: Not on file   . Transportation needs:    Medical: Not on file    Non-medical: Not on file  Tobacco Use  . Smoking status: Never Smoker  . Smokeless tobacco: Never Used  Substance and Sexual Activity  . Alcohol use: Yes    Comment: occasional  . Drug use: No  . Sexual activity: Not on file  Lifestyle  . Physical activity:    Days per week: Not on file    Minutes per session: Not on file  . Stress: Not on file  Relationships  . Social connections:    Talks on phone: Not on file    Gets together: Not on file    Attends religious service: Not on file    Active member of club or organization: Not on file    Attends meetings of clubs or organizations: Not on file    Relationship status: Not on file  . Intimate partner violence:    Fear of current or ex partner: Not on file    Emotionally abused: Not on file    Physically abused: Not on file    Forced sexual activity: Not on file  Other Topics Concern  . Not on file  Social History Narrative   From [place] as of 08/09/2018   No Known Allergies     Medication List  Accurate as of August 09, 2018  8:02 AM. If you have any questions, ask your nurse or doctor.        calcium citrate-vitamin D 315-200 MG-UNIT tablet Commonly known as:  CITRACAL+D Take 1 tablet by mouth 2 (two) times daily.   co-enzyme Q-10 30 MG capsule Take 30 mg by mouth daily.   dicyclomine 10 MG capsule Commonly known as:  BENTYL Take 1 capsule (10 mg total) by mouth 3 (three) times daily as needed for spasms.   famotidine 20 MG tablet Commonly known as:  Pepcid Take 1 tablet (20 mg total) by mouth daily.   Fish Oil 1000 MG Caps Take by mouth daily.   ibandronate 150 MG tablet Commonly known as:  Boniva Take 1 tablet (150 mg total) by mouth every 30 (thirty) days. Take in the morning with a full glass of water, on an empty stomach   ibuprofen 200 MG tablet Commonly known as:  ADVIL Take 200 mg by mouth every 6 (six) hours  as needed.           Objective:   Physical Exam There were no vitals taken for this visit. This is a virtual video visit.  Alert oriented x3, no apparent distress    Assessment      Assessment  ( new patient, 02/2016) Chest pain, dizziness: Normal ETT echo and event monitor 2011. ETT again 04-2014: Normal, good exercise capacity Hematuria: per patient previous urology w/u (-). Dr McDermoth Colon polyps  Menopause since ~ age 52 H/o hair  loss, saw dermatology, biopsy done. GERD-Dyspepsia- cholelithiasis  : rx bentyl Osteoporosis: T score -2.95/2019  PLAN: Osteoporosis: Patient is concerned about this dx and wonders why it is happening, explained patient that this is a very common condition after 10 years of menopause. Rec to stay physically active, calcium, vitamin D.  Will refill Boniva. Myalgias: Patient wonders if it is due to [facility], on chart review, she already complained about aches and pains prior to the initiation of Boniva.  At this time symptoms are more noticeable at night.  Rec  tonic water at night (for what seems to be nocturnal cramps) , judicious use of Tylenol and ibuprofen.  Reassess on RTC Multiple questions answered regards supplements RTC few weeks for CPX.    I discussed the assessment and treatment plan with the patient. The patient was provided an opportunity to ask questions and all were answered. The patient agreed with the plan and demonstrated an understanding of the instructions.   The patient was advised to call back or seek an in-person evaluation if the symptoms worsen or if the condition fails to improve as anticipated.

## 2018-08-10 DIAGNOSIS — M810 Age-related osteoporosis without current pathological fracture: Secondary | ICD-10-CM | POA: Insufficient documentation

## 2018-08-10 NOTE — Assessment & Plan Note (Signed)
Osteoporosis: Patient is concerned about this dx and wonders why it is happening, explained patient that this is a very common condition after 10 years of menopause. Rec to stay physically active, calcium, vitamin D.  Will refill Boniva. Myalgias: Patient wonders if it is due to [facility], on chart review, she already complained about aches and pains prior to the initiation of Boniva.  At this time symptoms are more noticeable at night.  Rec  tonic water at night (for what seems to be nocturnal cramps) , judicious use of Tylenol and ibuprofen.  Reassess on RTC Multiple questions answered regards supplements RTC few weeks for CPX.

## 2018-08-15 ENCOUNTER — Telehealth: Payer: Self-pay

## 2018-08-15 NOTE — Telephone Encounter (Signed)
Fax confirmation received. 

## 2018-08-15 NOTE — Telephone Encounter (Signed)
[facility] at [facility] needing labs and dexa scan report from the last 2-3 years in order to schedule referral. Labs and dexa scan report faxed to them at [phone].

## 2018-09-13 ENCOUNTER — Encounter: Payer: 59 | Admitting: Internal Medicine

## 2018-09-25 ENCOUNTER — Other Ambulatory Visit: Payer: Self-pay

## 2018-09-25 DIAGNOSIS — Z20822 Contact with and (suspected) exposure to covid-19: Secondary | ICD-10-CM

## 2018-09-29 LAB — NOVEL CORONAVIRUS, NAA: SARS-CoV-2, NAA: NOT DETECTED

## 2018-10-17 ENCOUNTER — Telehealth: Payer: Self-pay | Admitting: *Deleted

## 2018-10-17 NOTE — Telephone Encounter (Signed)
10/18/18 appointment has been cancelled.  Copied from [place] [phone]. Topic: Quick Communication - Appointment Cancellation >> Oct 17, 2018  4:07 PM Richardo Priest, [place] wrote: Patient called to cancel appointment scheduled for 8/14. Patient has not rescheduled their appointment. Patient states she is dealing with a loss in her family and will not be able to come. Will call back to rsc.   Route to department's PEC pool.

## 2018-10-18 ENCOUNTER — Encounter: Payer: 59 | Admitting: Internal Medicine

## 2018-10-18 NOTE — Telephone Encounter (Signed)
Okay, noted. Thank you.

## 2018-12-20 ENCOUNTER — Telehealth: Payer: Self-pay

## 2018-12-20 DIAGNOSIS — M810 Age-related osteoporosis without current pathological fracture: Secondary | ICD-10-CM

## 2018-12-20 NOTE — Telephone Encounter (Signed)
Copied from [place] [phone]. Topic: General - Other >> Dec 20, 2018  4:28 PM Alanda Slim E wrote: Reason for CRM: Pt called and needs an order for a DEXA scan and VFA sent to [facility] / the fax # is [facility] [address] , [place] [address] phone [phone] please advise   Pt stated it needs to be an order with the dexa scan and vfa together

## 2018-12-20 NOTE — Telephone Encounter (Signed)
Please advise- what is a VFA?

## 2018-12-23 NOTE — Telephone Encounter (Signed)
Order placed

## 2018-12-23 NOTE — Telephone Encounter (Signed)
Okay to place the orders, DX osteoporosis  (VFA = vertebral fracture assessment)

## 2018-12-27 NOTE — Telephone Encounter (Addendum)
Pt husband is calling and [place] radiologist is still waiting on fax for dexa scan/vfa [phone]. Please call ronnie husband once order has been faxed

## 2018-12-27 NOTE — Telephone Encounter (Signed)
Have orders been faxed?

## 2018-12-30 NOTE — Telephone Encounter (Signed)
Yes, orders have been refaxed today.

## 2018-12-30 NOTE — Telephone Encounter (Signed)
Noted, thanks!

## 2019-01-04 ENCOUNTER — Encounter: Payer: Self-pay | Admitting: Internal Medicine

## 2019-01-17 ENCOUNTER — Telehealth: Payer: Self-pay | Admitting: *Deleted

## 2019-01-17 DIAGNOSIS — M810 Age-related osteoporosis without current pathological fracture: Secondary | ICD-10-CM

## 2019-01-17 LAB — HM DEXA SCAN

## 2019-01-17 NOTE — Telephone Encounter (Signed)
Copied from [place] [phone]. Topic: General - Inquiry >> Jan 17, 2019  3:57 PM Mathis Bud wrote: Reason for CRM: Patient husband called stating that patient needs a different referral for endocrinology.  The referral made with Rubin,MD does not have anything until feb2021.  Patient husband is requesting referral go to [facility] endocrinologist. [phone]

## 2019-01-17 NOTE — Telephone Encounter (Signed)
Do you guys need a new referral for this or can you use the old one?

## 2019-01-20 ENCOUNTER — Encounter: Payer: Self-pay | Admitting: Internal Medicine

## 2019-01-20 NOTE — Telephone Encounter (Signed)
New referral please. This was from June.

## 2019-01-21 NOTE — Telephone Encounter (Signed)
No answer no vm and left message on cellphone.  Need to know what the name of office is, address, or phone number.  Want to make sure we get the referral to the right place.

## 2019-01-22 NOTE — Telephone Encounter (Signed)
Left message on machine that we will send referral over to fax nurmber provided.

## 2019-01-31 ENCOUNTER — Encounter: Payer: Self-pay | Admitting: Internal Medicine

## 2019-03-05 ENCOUNTER — Encounter: Payer: 59 | Admitting: Internal Medicine

## 2019-03-05 ENCOUNTER — Other Ambulatory Visit: Payer: Self-pay

## 2019-05-16 ENCOUNTER — Ambulatory Visit: Payer: 59 | Attending: Internal Medicine

## 2019-05-16 DIAGNOSIS — Z23 Encounter for immunization: Secondary | ICD-10-CM

## 2019-05-16 NOTE — Progress Notes (Signed)
   Covid-19 Vaccination Clinic  Name:  [name]    MRN: [id number] DOB: 07/08/1960  05/16/2019  [name] was observed post Covid-19 immunization for 15 minutes without incident. She was provided with Vaccine Information Sheet and instruction to access the V-Safe system.   [name] was instructed to call 911 with any severe reactions post vaccine: Marland Kitchen Difficulty breathing  . Swelling of face and throat  . A fast heartbeat  . A bad rash all over body  . Dizziness and weakness   Immunizations Administered    Name Date Dose VIS Date Route   Pfizer COVID-19 Vaccine 05/16/2019 10:20 AM 0.3 mL 02/14/2019 Intramuscular   Manufacturer: [place]   Lot: [id number]   [place]: [id number]

## 2019-06-11 ENCOUNTER — Ambulatory Visit: Payer: 59 | Attending: Internal Medicine

## 2019-06-11 DIAGNOSIS — Z23 Encounter for immunization: Secondary | ICD-10-CM

## 2019-06-11 NOTE — Progress Notes (Signed)
   Covid-19 Vaccination Clinic  Name:  BRENLYN BAXENDALE    MRN: [id number] DOB: [birth year]  06/11/2019  Ms. Sadik was observed post Covid-19 immunization for 15 minutes without incident. She was provided with Vaccine Information Sheet and instruction to access the V-Safe system.   Ms. Abdella was instructed to call 911 with any severe reactions post vaccine: Marland Kitchen Difficulty breathing  . Swelling of face and throat  . A fast heartbeat  . A bad rash all over body  . Dizziness and weakness   Immunizations Administered    Name Date Dose VIS Date Route   Pfizer COVID-19 Vaccine 06/11/2019  8:40 AM 0.3 mL 02/14/2019 Intramuscular   Manufacturer: [employer]   Lot: [id number]   [place]: [id number]

## 2019-07-05 ENCOUNTER — Encounter: Payer: Self-pay | Admitting: Internal Medicine

## 2019-07-08 ENCOUNTER — Telehealth: Payer: Self-pay | Admitting: Internal Medicine

## 2019-07-08 NOTE — Telephone Encounter (Signed)
Left voice message for patient to call back to schedule cpe.

## 2019-08-07 ENCOUNTER — Other Ambulatory Visit: Payer: Self-pay | Admitting: Internal Medicine

## 2019-08-15 ENCOUNTER — Other Ambulatory Visit: Payer: Self-pay

## 2019-08-15 ENCOUNTER — Ambulatory Visit: Payer: 59 | Admitting: Internal Medicine

## 2019-08-15 ENCOUNTER — Encounter: Payer: Self-pay | Admitting: Internal Medicine

## 2019-08-15 VITALS — BP 122/76 | HR 57 | Temp 97.0°F | Resp 16 | Ht 60.0 in | Wt 116.5 lb

## 2019-08-15 DIAGNOSIS — E559 Vitamin D deficiency, unspecified: Secondary | ICD-10-CM

## 2019-08-15 DIAGNOSIS — Z1211 Encounter for screening for malignant neoplasm of colon: Secondary | ICD-10-CM

## 2019-08-15 DIAGNOSIS — F439 Reaction to severe stress, unspecified: Secondary | ICD-10-CM

## 2019-08-15 DIAGNOSIS — M810 Age-related osteoporosis without current pathological fracture: Secondary | ICD-10-CM

## 2019-08-15 DIAGNOSIS — Z0000 Encounter for general adult medical examination without abnormal findings: Secondary | ICD-10-CM

## 2019-08-15 MED ORDER — IBANDRONATE SODIUM 150 MG PO TABS: 150.0000 mg | ORAL_TABLET | ORAL | 12 refills | Status: DC

## 2019-08-15 MED ORDER — CIPROFLOXACIN HCL 500 MG PO TABS: 500.0000 mg | ORAL_TABLET | Freq: Two times a day (BID) | ORAL | 0 refills | Status: DC

## 2019-08-15 NOTE — Progress Notes (Signed)
Subjective:    Patient ID: [name], female    DOB: 1960-08-24, 59 y.o.   MRN: [id number]  DOS:  08/15/2019 Type of visit - description: CPX Since the last office visit, saw endocrinology with regards osteoporosis treatment.  Review of Systems In general feeling well but admits to a number of symptoms, none of them are persistent or consistent, mostly described as a sporadic and mild: Occasional dizziness without associated symptoms. Occasionally has some mid abdominal discomfort without nausea, vomiting, diarrhea. On further questioning she is under a lot of stress related to family issues, some of them financial, has a second job.  Other than above, a 14 point review of systems is negative     Past Medical History:  Diagnosis Date  . Allergy   . Asthma   . Headache(784.0)   . Palpitations   . Pneumonia     Past Surgical History:  Procedure Laterality Date  . COLONOSCOPY    . COLPOSCOPY  04/28/2016  . OVARIAN CYST REMOVAL    . POLYPECTOMY     Family History  Problem Relation Age of Onset  . Hypertension Mother   . High Cholesterol Mother   . Diabetes Paternal Grandfather   . Atrial fibrillation Father        pacemaker  . Other Father        blood clotting disorder  . Hyperlipidemia Brother   . Heart disease Other   . Hypertension Other   . Hyperlipidemia Other   . Hypertension Other   . Colon cancer Neg Hx   . Breast cancer Neg Hx      Allergies as of 08/15/2019      Reactions   Clindamycin Diarrhea      Medication List       Accurate as of August 15, 2019 11:59 PM. If you have any questions, ask your nurse or doctor.        calcium citrate-vitamin D 315-200 MG-UNIT tablet Commonly known as: CITRACAL+D Take 1 tablet by mouth 2 (two) times daily.   ciprofloxacin 500 MG tablet Commonly known as: Cipro Take 1 tablet (500 mg total) by mouth 2 (two) times daily. Started by: Kathlene November, MD   co-enzyme Q-10 30 MG capsule Take 30 mg by mouth  daily.   dicyclomine 10 MG capsule Commonly known as: BENTYL Take 1 capsule (10 mg total) by mouth 3 (three) times daily as needed for spasms.   famotidine 20 MG tablet Commonly known as: Pepcid Take 1 tablet (20 mg total) by mouth daily.   Fish Oil 1000 MG Caps Take by mouth daily.   ibandronate 150 MG tablet Commonly known as: BONIVA Take 1 tablet (150 mg total) by mouth every 30 (thirty) days.   ibuprofen 200 MG tablet Commonly known as: ADVIL Take 200 mg by mouth every 6 (six) hours as needed.          Objective:   Physical Exam BP 122/76 (BP Location: Left Arm, Patient Position: Sitting, Cuff Size: Small)   Pulse (!) 57   Temp (!) 97 F (36.1 C) (Temporal)   Resp 16   Ht 5' (1.524 m)   Wt 116 lb 8 oz (52.8 kg)   SpO2 100%   BMI 22.75 kg/m  General: Well developed, NAD, BMI noted Neck: No  thyromegaly  HEENT:  Normocephalic . Face symmetric, atraumatic Lungs:  CTA B Normal respiratory effort, no intercostal retractions, no accessory muscle use. Heart: RRR,  no murmur.  Abdomen:  Not distended, soft, non-tender. No rebound or rigidity.   Lower extremities: no pretibial edema bilaterally  Skin: Exposed areas without rash. Not pale. Not jaundice Neurologic:  alert & oriented X3.  Speech normal, gait appropriate for age and unassisted Strength symmetric and appropriate for age.  Psych: Cognition and judgment appear intact.  Cooperative with normal attention span and concentration.  Behavior appropriate. No anxious or depressed appearing.     Assessment    Assessment  ( new patient, 02/2016) Chest pain, dizziness: Normal ETT echo and event monitor 2011. ETT again 04-2014: Normal, good exercise capacity Hematuria: per patient previous urology w/u (-). Dr McDermoth Colon polyps  Menopause since ~ age 51 H/o hair  loss, saw dermatology, biopsy done. GERD-Dyspepsia- cholelithiasis  : rx bentyl Osteoporosis: T score -2.9 07/2017 Vit D def  2014  PLAN: Here for CPX Osteoporosis: saw endo at [facility] 03/2019, labs done, was rec to cont bonivab Vitamin D deficiency: On supplements, checking labs. Travel advice: Going to [place] in few weeks, request ciprofloxacin for traveler's diarrhea, prescription sent, see AVS.  Also request malaria prophylaxis, CDC website check, she is not going to any areas that need malaria prophylaxis. Stress: Very stressed about a number of family issues, advise about counseling and possible medication.  She is not ready for meds at this point.  We agreed to come back in 4 months. Multiple symptoms: Occasional dizziness, occasional numbness at the lower extremities, sometimes upper and lower back pain.  None of the above is persistent or severe, will reassess on RTC. RTC 4 months  In addition to CPX, we discussed a number of other issues including osteoporosis, vitamin D deficiency, travel advice, distress and multiple symptoms.   This visit occurred during the SARS-CoV-2 public health emergency.  Safety protocols were in place, including screening questions prior to the visit, additional usage of staff PPE, and extensive cleaning of exam room while observing appropriate contact time as indicated for disinfecting solutions.

## 2019-08-15 NOTE — Progress Notes (Signed)
Pre visit review using our clinic review tool, if applicable. No additional management support is needed unless otherwise documented below in the visit note. 

## 2019-08-15 NOTE — Patient Instructions (Addendum)
Am prescribing ciprofloxacin to be used in case you develop diarrhea while visiting [place]. If you have severe symptoms, fever, chills, blood in the stools or you are not better after 3 days of antibiotics: Seek medical attention  GO TO THE LAB : Get the blood work     [place], [place] back for a checkup in 4 months

## 2019-08-16 LAB — CBC WITH DIFFERENTIAL/PLATELET
Basophils Absolute: 0.1 10*3/uL (ref 0.0–0.2)
Basos: 1 %
EOS (ABSOLUTE): 0.7 10*3/uL — ABNORMAL HIGH (ref 0.0–0.4)
Eos: 11 %
Hematocrit: 39.5 % (ref 34.0–46.6)
Hemoglobin: 13.2 g/dL (ref 11.1–15.9)
Immature Grans (Abs): 0 10*3/uL (ref 0.0–0.1)
Immature Granulocytes: 0 %
Lymphocytes Absolute: 2.6 10*3/uL (ref 0.7–3.1)
Lymphs: 41 %
MCH: 31 pg (ref 26.6–33.0)
MCHC: 33.4 g/dL (ref 31.5–35.7)
MCV: 93 fL (ref 79–97)
Monocytes Absolute: 0.4 10*3/uL (ref 0.1–0.9)
Monocytes: 6 %
Neutrophils Absolute: 2.6 10*3/uL (ref 1.4–7.0)
Neutrophils: 41 %
Platelets: 254 10*3/uL (ref 150–450)
RBC: 4.26 x10E6/uL (ref 3.77–5.28)
RDW: 12.9 % (ref 11.7–15.4)
WBC: 6.4 10*3/uL (ref 3.4–10.8)

## 2019-08-16 LAB — COMPREHENSIVE METABOLIC PANEL
ALT: 13 IU/L (ref 0–32)
AST: 14 IU/L (ref 0–40)
Albumin/Globulin Ratio: 1.7 (ref 1.2–2.2)
Albumin: 4.4 g/dL (ref 3.8–4.9)
Alkaline Phosphatase: 67 IU/L (ref 48–121)
BUN/Creatinine Ratio: 28 — ABNORMAL HIGH (ref 9–23)
BUN: 16 mg/dL (ref 6–24)
Bilirubin Total: 0.5 mg/dL (ref 0.0–1.2)
CO2: 23 mmol/L (ref 20–29)
Calcium: 9 mg/dL (ref 8.7–10.2)
Chloride: 104 mmol/L (ref 96–106)
Creatinine, Ser: 0.58 mg/dL (ref 0.57–1.00)
GFR calc Af Amer: 117 mL/min/{1.73_m2} (ref >59)
GFR calc non Af Amer: 101 mL/min/{1.73_m2} (ref >59)
Globulin, Total: 2.6 g/dL (ref 1.5–4.5)
Glucose: 87 mg/dL (ref 65–99)
Potassium: 4.2 mmol/L (ref 3.5–5.2)
Sodium: 142 mmol/L (ref 134–144)
Total Protein: 7 g/dL (ref 6.0–8.5)

## 2019-08-16 LAB — LIPID PANEL
Chol/HDL Ratio: 2.8 ratio (ref 0.0–4.4)
Cholesterol, Total: 235 mg/dL — ABNORMAL HIGH (ref 100–199)
HDL: 83 mg/dL (ref >39)
LDL Chol Calc (NIH): 137 mg/dL — ABNORMAL HIGH (ref 0–99)
Triglycerides: 88 mg/dL (ref 0–149)
VLDL Cholesterol Cal: 15 mg/dL (ref 5–40)

## 2019-08-16 LAB — TSH: TSH: 1.53 u[IU]/mL (ref 0.450–4.500)

## 2019-08-16 LAB — VITAMIN D 25 HYDROXY (VIT D DEFICIENCY, FRACTURES): Vit D, 25-Hydroxy: 41.8 ng/mL (ref 30.0–100.0)

## 2019-08-17 NOTE — Assessment & Plan Note (Signed)
-  Td 2013 per pt, no documentation, declined booster -Had Covid shot -Recommend flu shot yearly - Female care: Follow-up by gynecology  PAP 09/2017 per [place], MMG 02/2019 per gyn  - CCS- Cscope 2011, Dr Deatra Ina, refer to GI - Labs :  CMP, FLP, CBC, TSH, vitamin D - Diet and exercise discussed

## 2019-08-17 NOTE — Assessment & Plan Note (Signed)
Here for CPX Osteoporosis: saw endo at [facility] 03/2019, labs done, was rec to cont bonivab Vitamin D deficiency: On supplements, checking labs. Travel advice: Going to [place] in few weeks, request ciprofloxacin for traveler's diarrhea, prescription sent, see AVS.  Also request malaria prophylaxis, CDC website check, she is not going to any areas that need malaria prophylaxis. Stress: Very stressed about a number of family issues, advise about counseling and possible medication.  She is not ready for meds at this point.  We agreed to come back in 4 months. Multiple symptoms: Occasional dizziness, occasional numbness at the lower extremities, sometimes upper and lower back pain.  None of the above is persistent or severe, will reassess on RTC. RTC 4 months

## 2019-09-01 ENCOUNTER — Other Ambulatory Visit: Payer: Self-pay | Admitting: Internal Medicine

## 2019-09-25 ENCOUNTER — Institutional Professional Consult (permissible substitution): Payer: 59 | Admitting: Pulmonary Disease

## 2019-10-07 ENCOUNTER — Encounter: Payer: Self-pay | Admitting: Gastroenterology

## 2019-11-21 ENCOUNTER — Institutional Professional Consult (permissible substitution): Payer: 59 | Admitting: Internal Medicine

## 2019-11-28 ENCOUNTER — Other Ambulatory Visit: Payer: Self-pay

## 2019-11-28 ENCOUNTER — Ambulatory Visit (AMBULATORY_SURGERY_CENTER): Payer: Self-pay | Admitting: *Deleted

## 2019-11-28 ENCOUNTER — Telehealth: Payer: Self-pay | Admitting: Gastroenterology

## 2019-11-28 ENCOUNTER — Encounter: Payer: Self-pay | Admitting: Gastroenterology

## 2019-11-28 VITALS — Ht 60.0 in | Wt 117.0 lb

## 2019-11-28 DIAGNOSIS — Z1211 Encounter for screening for malignant neoplasm of colon: Secondary | ICD-10-CM

## 2019-11-28 MED ORDER — PLENVU 140 G PO SOLR: 1.0000 | Freq: Once | ORAL | 0 refills | Status: AC

## 2019-11-28 NOTE — Progress Notes (Signed)
Patient is here in-person for PV. Patient denies any allergies to eggs or soy. Patient denies any problems with anesthesia/sedation. Patient denies any oxygen use at home. Patient denies taking any diet/weight loss medications or blood thinners. Patient is not being treated for MRSA or C-diff. Patient is aware of our care-partner policy and FVCBS-49 safety protocol. EMMI education assisgned to the patient for the procedure, sent to MyChart.   COVID-19 vaccines completed on 06/2019, per patient.   Prep Prescription coupon given to the patient.

## 2019-11-28 NOTE — Telephone Encounter (Signed)
Pt's husband Ronie called. He has some questions, pls call him.

## 2019-11-28 NOTE — Telephone Encounter (Signed)
He states the plemvu will need to be orfered from his pharmacy-he asked if he could not get should he go to another pharmacy and I told him yes.

## 2019-12-12 ENCOUNTER — Other Ambulatory Visit: Payer: Self-pay

## 2019-12-12 ENCOUNTER — Ambulatory Visit (AMBULATORY_SURGERY_CENTER): Payer: 59 | Admitting: Gastroenterology

## 2019-12-12 ENCOUNTER — Encounter: Payer: Self-pay | Admitting: Gastroenterology

## 2019-12-12 VITALS — BP 106/64 | HR 62 | Temp 97.0°F | Resp 12 | Ht 60.0 in | Wt 117.0 lb

## 2019-12-12 DIAGNOSIS — Z1211 Encounter for screening for malignant neoplasm of colon: Secondary | ICD-10-CM

## 2019-12-12 DIAGNOSIS — D120 Benign neoplasm of cecum: Secondary | ICD-10-CM

## 2019-12-12 HISTORY — PX: COLONOSCOPY: SHX174

## 2019-12-12 MED ORDER — SODIUM CHLORIDE 0.9 % IV SOLN: 500.0000 mL | Freq: Once | INTRAVENOUS | Status: DC

## 2019-12-12 NOTE — Op Note (Signed)
[name] Patient Name: [name] Procedure Date: 12/12/2019 1:12 PM MRN: [id number] Endoscopist: Ladene Artist , MD Age: 59 Referring MD:  Date of Birth: 08/19/1960 Gender: Female Account #: [id number] Procedure:                Colonoscopy Indications:              Screening for colorectal malignant neoplasm Medicines:                Monitored Anesthesia Care Procedure:                Pre-Anesthesia Assessment:                           - Prior to the procedure, a History and Physical                            was performed, and patient medications and                            allergies were reviewed. The patient's tolerance of                            previous anesthesia was also reviewed. The risks                            and benefits of the procedure and the sedation                            options and risks were discussed with the patient.                            All questions were answered, and informed consent                            was obtained. Prior Anticoagulants: The patient has                            taken no previous anticoagulant or antiplatelet                            agents. ASA Grade Assessment: II - A patient with                            mild systemic disease. After reviewing the risks                            and benefits, the patient was deemed in                            satisfactory condition to undergo the procedure.                           After obtaining informed consent, the colonoscope  was passed under direct vision. Throughout the                            procedure, the patient's blood pressure, pulse, and                            oxygen saturations were monitored continuously. The                            Colonoscope was introduced through the anus and                            advanced to the the cecum, identified by                            appendiceal orifice and  ileocecal valve. The                            ileocecal valve, appendiceal orifice, and rectum                            were photographed. The quality of the bowel                            preparation was excellent. The colonoscopy was                            performed without difficulty. The patient tolerated                            the procedure well. Scope In: 1:24:02 PM Scope Out: 1:40:57 PM Scope Withdrawal Time: 0 hours 12 minutes 3 seconds  Total Procedure Duration: 0 hours 16 minutes 55 seconds  Findings:                 The perianal and digital rectal examinations were                            normal.                           A 8 mm polyp was found in the cecum. The polyp was                            sessile. The polyp was removed with a cold snare.                            Resection and retrieval were complete.                           Scattered small-mouthed diverticula were found in                            the entire colon. There was no evidence of  diverticular bleeding.                           The exam was otherwise without abnormality on                            direct and retroflexion views. Complications:            No immediate complications. Estimated blood loss:                            None. Estimated Blood Loss:     Estimated blood loss: none. Impression:               - One 8 mm polyp in the cecum, removed with a cold                            snare. Resected and retrieved.                           - Mild diverticulosis in the entire examined colon.                           - The examination was otherwise normal on direct                            and retroflexion views. Recommendation:           - Repeat colonoscopy after studies are complete for                            surveillance based on pathology results.                           - Patient has a contact number available for                             emergencies. The signs and symptoms of potential                            delayed complications were discussed with the                            patient. Return to normal activities tomorrow.                            Written discharge instructions were provided to the                            patient.                           - High fiber diet.                           - Continue present medications.                           -  Await pathology results. Ladene Artist, MD 12/12/2019 1:43:59 PM This report has been signed electronically.

## 2019-12-12 NOTE — Progress Notes (Signed)
Called to room to assist during endoscopic procedure.  Patient ID and intended procedure confirmed with present staff. Received instructions for my participation in the procedure from the performing physician.  

## 2019-12-12 NOTE — Progress Notes (Signed)
Pt's states no medical or surgical changes since previsit or office visit.  ° °Vitals CW °

## 2019-12-12 NOTE — Progress Notes (Signed)
To PACU, VSS. Report to RN.tb 

## 2019-12-12 NOTE — Patient Instructions (Signed)
Handouts provided on polyps, diverticulosis and high-fiber diet.   High-fiber diet (see handout).  YOU HAD AN ENDOSCOPIC PROCEDURE TODAY AT [facility]:   Refer to the procedure report that was given to you for any specific questions about what was found during the examination.  If the procedure report does not answer your questions, please call your gastroenterologist to clarify.  If you requested that your care partner not be given the details of your procedure findings, then the procedure report has been included in a sealed envelope for you to review at your convenience later.  YOU SHOULD EXPECT: Some feelings of bloating in the abdomen. Passage of more gas than usual.  Walking can help get rid of the air that was put into your GI tract during the procedure and reduce the bloating. If you had a lower endoscopy (such as a colonoscopy or flexible sigmoidoscopy) you may notice spotting of blood in your stool or on the toilet paper. If you underwent a bowel prep for your procedure, you may not have a normal bowel movement for a few days.  Please Note:  You might notice some irritation and congestion in your nose or some drainage.  This is from the oxygen used during your procedure.  There is no need for concern and it should clear up in a day or so.  SYMPTOMS TO REPORT IMMEDIATELY:   Following lower endoscopy (colonoscopy or flexible sigmoidoscopy):  Excessive amounts of blood in the stool  Significant tenderness or worsening of abdominal pains  Swelling of the abdomen that is new, acute  Fever of 100F or higher  For urgent or emergent issues, a gastroenterologist can be reached at any hour by calling [phone]. Do not use MyChart messaging for urgent concerns.    DIET:  We do recommend a small meal at first, but then you may proceed to your regular diet.  Drink plenty of fluids but you should avoid alcoholic beverages for 24 hours.  ACTIVITY:  You should plan to take  it easy for the rest of today and you should NOT DRIVE or use heavy machinery until tomorrow (because of the sedation medicines used during the test).    FOLLOW UP: Our staff will call the number listed on your records 48-72 hours following your procedure to check on you and address any questions or concerns that you may have regarding the information given to you following your procedure. If we do not reach you, we will leave a message.  We will attempt to reach you two times.  During this call, we will ask if you have developed any symptoms of COVID 19. If you develop any symptoms (ie: fever, flu-like symptoms, shortness of breath, cough etc.) before then, please call [phone].  If you test positive for Covid 19 in the 2 weeks post procedure, please call and report this information to [place].    If any biopsies were taken you will be contacted by phone or by letter within the next 1-3 weeks.  Please call us at [phone] if you have not heard about the biopsies in 3 weeks.    SIGNATURES/CONFIDENTIALITY: You and/or your care partner have signed paperwork which will be entered into your electronic medical record.  These signatures attest to the fact that that the information above on your After Visit Summary has been reviewed and is understood.  Full responsibility of the confidentiality of this discharge information lies with you and/or your care-partner.

## 2019-12-16 ENCOUNTER — Telehealth: Payer: Self-pay | Admitting: *Deleted

## 2019-12-16 NOTE — Telephone Encounter (Signed)
°  Follow up Call-  Call back number 12/12/2019  Post procedure Call Back phone  # [phone]  Permission to leave phone message Yes  Some recent data might be hidden    Spoke with her husband Patient questions:  Do you have a fever, pain , or abdominal swelling? No. Pain Score  0 *  Have you tolerated food without any problems? Yes.    Have you been able to return to your normal activities? Yes.    Do you have any questions about your discharge instructions: Diet   No. Medications  No. Follow up visit  No.  Do you have questions or concerns about your Care? No.  Actions: * If pain score is 4 or above: No action needed, pain <4.  1. Have you developed a fever since your procedure? no  2.   Have you had an respiratory symptoms (SOB or cough) since your procedure? no  3.   Have you tested positive for COVID 19 since your procedure no  4.   Have you had any family members/close contacts diagnosed with the COVID 19 since your procedure?  no   If yes to any of these questions please route to Joylene John, RN and Joella Prince, RN

## 2019-12-16 NOTE — Telephone Encounter (Signed)
First attempt, left VM.  

## 2019-12-19 ENCOUNTER — Other Ambulatory Visit: Payer: Self-pay

## 2019-12-19 ENCOUNTER — Encounter: Payer: Self-pay | Admitting: Pulmonary Disease

## 2019-12-19 ENCOUNTER — Ambulatory Visit (INDEPENDENT_AMBULATORY_CARE_PROVIDER_SITE_OTHER): Payer: 59

## 2019-12-19 ENCOUNTER — Ambulatory Visit (INDEPENDENT_AMBULATORY_CARE_PROVIDER_SITE_OTHER): Payer: 59 | Admitting: Pulmonary Disease

## 2019-12-19 ENCOUNTER — Ambulatory Visit: Payer: 59 | Admitting: Internal Medicine

## 2019-12-19 DIAGNOSIS — D721 Eosinophilia, unspecified: Secondary | ICD-10-CM

## 2019-12-19 DIAGNOSIS — J4520 Mild intermittent asthma, uncomplicated: Secondary | ICD-10-CM

## 2019-12-19 LAB — CBC WITH DIFFERENTIAL/PLATELET
Basophils Absolute: 0.1 10*3/uL (ref 0.0–0.1)
Basophils Relative: 1.8 % (ref 0.0–3.0)
Eosinophils Absolute: 0.3 10*3/uL (ref 0.0–0.7)
Eosinophils Relative: 5.4 % — ABNORMAL HIGH (ref 0.0–5.0)
HCT: 36.8 % (ref 36.0–46.0)
Hemoglobin: 12.2 g/dL (ref 12.0–15.0)
Lymphocytes Relative: 41.6 % (ref 12.0–46.0)
Lymphs Abs: 1.9 10*3/uL (ref 0.7–4.0)
MCHC: 33 g/dL (ref 30.0–36.0)
MCV: 92.3 fl (ref 78.0–100.0)
Monocytes Absolute: 0.2 10*3/uL (ref 0.1–1.0)
Monocytes Relative: 4.9 % (ref 3.0–12.0)
Neutro Abs: 2.2 10*3/uL (ref 1.4–7.7)
Neutrophils Relative %: 46.3 % (ref 43.0–77.0)
Platelets: 223 10*3/uL (ref 150.0–400.0)
RBC: 3.99 Mil/uL (ref 3.87–5.11)
RDW: 14 % (ref 11.5–15.5)
WBC: 4.7 10*3/uL (ref 4.0–10.5)

## 2019-12-19 NOTE — Progress Notes (Signed)
Subjective:    Patient ID: [name], female    DOB: Mar 19, 1960, 59 y.o.   MRN: [id number]  HPI  59 year old never smoker, originally from [place] presents for evaluation of asthma and eosinophilia. She is accompanied by her husband [place].  She emigrated from [place] to [place] in 2002.  She reports asthma with onset in her 50s, triggers being weather changes .  Husband attributes this to the weather and [place] in the smog over [place] , when she moved to the [place] her symptoms subsided and she has never had an exacerbation or required inhalers in the last few years. Blood work has shown persistent eosinophilia ranging from an absolute eosinophil count of 500-800 range.  He is worried whether this indicates that asthma will get worse in the future. She reports itchy eyes runny nose during allergy season for which she has taken over-the-counter antihistaminics.  Zyrtec gives her a hangover effect so she uses Claritin. She reports skin rash over her neck which was evaluated by dermatology and attributed to cosmetics.  She gives a history of tuberculosis in her 41s that was treated for 1 year with 3 drug therapy  Labs -CBC reviewed 08/2019 11% eos, AEC 700 07/2017 7.8%, 500 02/2016 700 2013 800  Chest x-ray from 11/2006 and 02/2016 were reviewed which shows right upper lobe scarring consistent with old tuberculosis, healed and volume loss on the right.    Past Medical History:  Diagnosis Date  . Allergy   . Asthma 1990's  . GERD (gastroesophageal reflux disease)   . Headache(784.0)   . Palpitations   . Pneumonia   . Tuberculosis 1990's    Past Surgical History:  Procedure Laterality Date  . COLONOSCOPY  08/31/2009  . COLONOSCOPY  12/12/2019  . COLPOSCOPY  04/28/2016  . OVARIAN CYST REMOVAL    . POLYPECTOMY      Allergies  Allergen Reactions  . Clindamycin Diarrhea    Social History   Socioeconomic History  . Marital status: Married    Spouse name: Not on file  .  Number of children: 0  . Years of education: Not on file  . Highest education level: Not on file  Occupational History  . Occupation: Scientist, water quality [employer]  Tobacco Use  . Smoking status: Never Smoker  . Smokeless tobacco: Never Used  Vaping Use  . Vaping Use: Never used  Substance and Sexual Activity  . Alcohol use: Yes    Comment: occasional wine  . Drug use: No  . Sexual activity: Not on file  Other Topics Concern  . Not on file  Social History Narrative   From [place] Determinants of Health   Financial Resource Strain:   . Difficulty of Paying Living Expenses: Not on file  Food Insecurity:   . Worried About Charity fundraiser in the Last Year: Not on file  . Ran Out of Food in the Last Year: Not on file  Transportation Needs:   . Lack of Transportation (Medical): Not on file  . Lack of Transportation (Non-Medical): Not on file  Physical Activity:   . Days of Exercise per Week: Not on file  . Minutes of Exercise per Session: Not on file  Stress:   . Feeling of Stress : Not on file  Social Connections:   . Frequency of Communication with Friends and Family: Not on file  . Frequency of Social Gatherings with Friends and Family: Not on file  .  Attends Religious Services: Not on file  . Active Member of Clubs or Organizations: Not on file  . Attends Archivist Meetings: Not on file  . Marital Status: Not on file  Intimate Partner Violence:   . Fear of Current or Ex-Partner: Not on file  . Emotionally Abused: Not on file  . Physically Abused: Not on file  . Sexually Abused: Not on file    Family History  Problem Relation Age of Onset  . Hypertension Mother   . High Cholesterol Mother   . Diabetes Paternal Grandfather   . Atrial fibrillation Father        pacemaker  . Other Father        blood clotting disorder  . Hyperlipidemia Brother   . Heart disease Other   . Hypertension Other   . Hyperlipidemia Other   . Hypertension Other   .  Colon cancer Neg Hx   . Breast cancer Neg Hx   . Colon polyps Neg Hx      Review of Systems Acid heartburn Itching and skin rash on neck -seen by dermatology, attributed to cosmetics  Constitutional: negative for anorexia, fevers and sweats  Eyes: negative for irritation, redness and visual disturbance  Ears, nose, mouth, throat, and face: negative for earaches, epistaxis, nasal congestion and sore throat  Respiratory: negative for cough, dyspnea on exertion, sputum and wheezing  Cardiovascular: negative for chest pain, dyspnea, lower extremity edema, orthopnea, palpitations and syncope  Gastrointestinal: negative for abdominal pain, constipation, diarrhea, melena, nausea and vomiting  Genitourinary:negative for dysuria, frequency and hematuria  Hematologic/lymphatic: negative for bleeding, easy bruising and lymphadenopathy  Musculoskeletal:negative for arthralgias, muscle weakness and stiff joints  Neurological: negative for coordination problems, gait problems, headaches and weakness  Endocrine: negative for diabetic symptoms including polydipsia, polyuria and weight loss     Objective:   Physical Exam  Gen. Pleasant, thin woman, in no distress, normal affect ENT - no pallor,icterus, no post nasal drip Neck: No JVD, no thyromegaly, no carotid bruits Lungs: no use of accessory muscles, no dullness to percussion, clear without rales or rhonchi  Cardiovascular: Rhythm regular, heart sounds  normal, no murmurs or gallops, no peripheral edema Abdomen: soft and non-tender, no hepatosplenomegaly, BS normal. Musculoskeletal: No deformities, no cyanosis or clubbing Neuro:  alert, non focal       Assessment & Plan:

## 2019-12-19 NOTE — Assessment & Plan Note (Signed)
absolute eosinophil count has stayed high but less than 800 since 2013 Check CBC with differential, RAST panel for allergies Okay to take Claritin 10 mg daily during spring and fall allergy season.  Doubt further work-up is necessary here. Does not seem to be related to atopic dermatitis.  She is originally from [place]. No reason to suspect hypereosinophilic syndromes or hematologic malignancy

## 2019-12-19 NOTE — Assessment & Plan Note (Signed)
Asthma has been well controlled since she moved to the [place] in 2002.  Has never required inhaler, no exacerbations I doubt that she needs active therapy for this or bronchodilators  Chest x-ray today -prior chest x-rays independently reviewed which have showed stable right upper lobe scarring and volume loss on right Schedule spirometry pre and post -to establish baseline

## 2019-12-19 NOTE — Patient Instructions (Signed)
Your absolute eosinophil count has stayed high but less than 800 since 2013  Check CBC with differential, RAST panel for allergies Okay to take Claritin 10 mg daily during spring and fall allergy season.  Chest x-ray today Schedule spirometry pre and post

## 2019-12-20 ENCOUNTER — Ambulatory Visit: Payer: 59 | Attending: Internal Medicine

## 2019-12-20 DIAGNOSIS — Z23 Encounter for immunization: Secondary | ICD-10-CM

## 2019-12-20 NOTE — Progress Notes (Signed)
   Covid-19 Vaccination Clinic  Name:  JALEEAH SLIGHT    MRN: [id number] DOB: February 09, 1961  12/20/2019  Ms. Winkleman was observed post Covid-19 immunization for 15 minutes without incident. She was provided with Vaccine Information Sheet and instruction to access the V-Safe system.   Ms. Failla was instructed to call 911 with any severe reactions post vaccine: Marland Kitchen Difficulty breathing  . Swelling of face and throat  . A fast heartbeat  . A bad rash all over body  . Dizziness and weakness

## 2019-12-22 ENCOUNTER — Telehealth: Payer: Self-pay | Admitting: Gastroenterology

## 2019-12-22 ENCOUNTER — Encounter: Payer: Self-pay | Admitting: Gastroenterology

## 2019-12-22 LAB — RESPIRATORY ALLERGY PROFILE REGION II ~~LOC~~
Allergen, A. alternata, m6: <0.1 kU/L
Allergen, Cedar tree, t12: <0.1 kU/L
Allergen, Comm Silver Birch, t9: <0.1 kU/L
Allergen, Cottonwood, t14: <0.1 kU/L
Allergen, D pternoyssinus,d7: 32.9 kU/L — ABNORMAL HIGH
Allergen, Mouse Urine Protein, e78: <0.1 kU/L
Allergen, Mulberry, t76: <0.1 kU/L
Allergen, Oak,t7: <0.1 kU/L
Allergen, P. notatum, m1: <0.1 kU/L
Aspergillus fumigatus, m3: <0.1 kU/L
Bermuda Grass: <0.1 kU/L
Box Elder IgE: <0.1 kU/L
CLADOSPORIUM HERBARUM (M2) IGE: <0.1 kU/L
COMMON RAGWEED (SHORT) (W1) IGE: <0.1 kU/L
Cat Dander: <0.1 kU/L
Class: 0
Class: 0
Class: 0
Class: 0
Class: 0
Class: 0
Class: 0
Class: 0
Class: 0
Class: 0
Class: 0
Class: 0
Class: 0
Class: 0
Class: 0
Class: 0
Class: 0
Class: 0
Class: 0
Class: 0
Class: 0
Class: 4
Class: 4
Cockroach: 0.18 kU/L — ABNORMAL HIGH
D. farinae: 43.9 kU/L — ABNORMAL HIGH
Dog Dander: <0.1 kU/L
Elm IgE: <0.1 kU/L
IgE (Immunoglobulin E), Serum: 314 kU/L — ABNORMAL HIGH (ref <=114)
Johnson Grass: <0.1 kU/L
Pecan/Hickory Tree IgE: <0.1 kU/L
Rough Pigweed  IgE: <0.1 kU/L
Sheep Sorrel IgE: <0.1 kU/L
Timothy Grass: <0.1 kU/L

## 2019-12-22 LAB — INTERPRETATION:

## 2019-12-22 NOTE — Telephone Encounter (Signed)
Pt's spouse is requesting a call back from a nurse to discuss the colonoscopy the pt had with Dr Fuller Plan.

## 2019-12-22 NOTE — Telephone Encounter (Signed)
Left message for patient to call back  

## 2019-12-23 NOTE — Telephone Encounter (Signed)
All questions answered at this time.  She will call back for additional questions or concerns.

## 2019-12-26 ENCOUNTER — Other Ambulatory Visit: Payer: Self-pay

## 2019-12-26 ENCOUNTER — Encounter: Payer: Self-pay | Admitting: Internal Medicine

## 2019-12-26 ENCOUNTER — Ambulatory Visit (INDEPENDENT_AMBULATORY_CARE_PROVIDER_SITE_OTHER): Payer: 59 | Admitting: Internal Medicine

## 2019-12-26 VITALS — BP 92/68 | HR 88 | Temp 98.0°F | Resp 16 | Ht 60.0 in | Wt 117.2 lb

## 2019-12-26 DIAGNOSIS — K5909 Other constipation: Secondary | ICD-10-CM

## 2019-12-26 DIAGNOSIS — J4520 Mild intermittent asthma, uncomplicated: Secondary | ICD-10-CM | POA: Diagnosis not present

## 2019-12-26 DIAGNOSIS — D721 Eosinophilia, unspecified: Secondary | ICD-10-CM | POA: Diagnosis not present

## 2019-12-26 NOTE — Progress Notes (Signed)
Called and spoke with both patient and patient's husband together about xray and lab results per Dr Elsworth Soho. Both expressed understanding and all questions answered. Patient was wondering if she can have blood testing for exposure to mold? If so, can this be ordered to be drawn on the day patient comes in for PFT on 01/16/2020?

## 2019-12-26 NOTE — Progress Notes (Signed)
Subjective:    Patient ID: [name], female    DOB: 10-Jun-1960, 59 y.o.   MRN: [id number]  DOS:  12/26/2019 Type of visit - description: Follow-up, here with her husband. They have concerns about constipation, diverticulosis and colon polyps. Also concerns about asthma and mold exposure.  Review of Systems See above   Past Medical History:  Diagnosis Date  . Allergy   . Asthma 1990's  . GERD (gastroesophageal reflux disease)   . Headache(784.0)   . Palpitations   . Pneumonia   . Tuberculosis 1990's    Past Surgical History:  Procedure Laterality Date  . COLONOSCOPY  08/31/2009  . COLONOSCOPY  12/12/2019  . COLPOSCOPY  04/28/2016  . OVARIAN CYST REMOVAL    . POLYPECTOMY      Allergies as of 12/26/2019      Reactions   Clindamycin Diarrhea      Medication List       Accurate as of December 26, 2019 11:59 PM. If you have any questions, ask your nurse or doctor.        co-enzyme Q-10 30 MG capsule Take 30 mg by mouth daily.   famotidine 20 MG tablet Commonly known as: Pepcid Take 1 tablet (20 mg total) by mouth daily.   ibandronate 150 MG tablet Commonly known as: BONIVA Take 1 tablet (150 mg total) by mouth every 30 (thirty) days.   ibuprofen 200 MG tablet Commonly known as: ADVIL Take 200 mg by mouth every 6 (six) hours as needed.          Objective:   Physical Exam BP 92/68 (BP Location: Left Arm, Patient Position: Sitting, Cuff Size: Small)   Pulse 88   Temp 98 F (36.7 C) (Oral)   Resp 16   Ht 5' (1.524 m)   Wt 117 lb 4 oz (53.2 kg)   SpO2 97%   BMI 22.90 kg/m  General:   Well developed, NAD, BMI noted. HEENT:  Normocephalic . Face symmetric, atraumatic Lungs:  CTA B Normal respiratory effort, no intercostal retractions, no accessory muscle use. Heart: RRR,  no murmur.  Lower extremities: no pretibial edema bilaterally  Skin: Not pale. Not jaundice Neurologic:  alert & oriented X3.  Speech normal, gait appropriate for age  and unassisted Psych--  Cognition and judgment appear intact.  Cooperative with normal attention span and concentration.  Behavior appropriate. No anxious or depressed appearing.      Assessment     Assessment  ( new patient, 02/2016) Chest pain, dizziness: Normal ETT echo and event monitor 2011. ETT again 04-2014: Normal, good exercise capacity Hematuria: per patient previous urology w/u (-). Dr McDermoth Colon polyps  Menopause since ~ age 30 H/o hair  loss, saw dermatology, biopsy done. GERD-Dyspepsia- cholelithiasis  : rx bentyl Osteoporosis: T score -2.9 07/2017 Vit D def 2014 History of tuberculosis in her 79s. Asthma, eosinophilia: Seen by pulmonology 12-2019  PLAN: Asthma: Since the last visit, pt got a visit w/ pulmonary, was dx w/  asthma, eosinophilia.  They noted a chest x-ray with a scar.  The patient and her husband have multiple questions about asthma, allergies, mold exposure. I tried to answer to the best of my ability but I think they need to talk with an allergist at some point. Colon polyps, diverticulosis, chronic constipation: Advise about diet with high fruits, vegetables, long-term Metamucil and MiraLAX if needed.  She is reluctant to take MiraLAX frequently.  They plan to see GI about this issues.  Low BP: BP was low upon arrival, recheck 92/68, no symptoms.  Observation and good hydration Preventive care: Have COVID booster and declining flu shot today but plans to do it in few days.  With new DX of asthma she will qualify for [facility] Again, multiple questions answered to the best of my ability RTC CPX 08/2020   This visit occurred during the SARS-CoV-2 public health emergency.  Safety protocols were in place, including screening questions prior to the visit, additional usage of staff PPE, and extensive cleaning of exam room while observing appropriate contact time as indicated for disinfecting solutions.

## 2019-12-26 NOTE — Patient Instructions (Signed)
    GO TO THE FRONT DESK, PLEASE SCHEDULE YOUR APPOINTMENTS Come back for   a physical exam June 2022

## 2019-12-26 NOTE — Progress Notes (Signed)
Pre visit review using our clinic review tool, if applicable. No additional management support is needed unless otherwise documented below in the visit note. 

## 2019-12-28 NOTE — Assessment & Plan Note (Signed)
Asthma: Since the last visit, pt got a visit w/ pulmonary, was dx w/  asthma, eosinophilia.  They noted a chest x-ray with a scar.  The patient and her husband have multiple questions about asthma, allergies, mold exposure. I tried to answer to the best of my ability but I think they need to talk with an allergist at some point. Colon polyps, diverticulosis, chronic constipation: Advise about diet with high fruits, vegetables, long-term Metamucil and MiraLAX if needed.  She is reluctant to take MiraLAX frequently.  They plan to see GI about this issues. Low BP: BP was low upon arrival, recheck 92/68, no symptoms.  Observation and good hydration Preventive care: Have COVID booster and declining flu shot today but plans to do it in few days.  With new DX of asthma she will qualify for [facility] Again, multiple questions answered to the best of my ability RTC CPX 08/2020

## 2019-12-29 NOTE — Progress Notes (Signed)
Called and left message on voicemail to please return phone call. Contact number provided. 

## 2019-12-31 ENCOUNTER — Telehealth: Payer: Self-pay | Admitting: Pulmonary Disease

## 2019-12-31 NOTE — Progress Notes (Signed)
Called and spoke with patient's husband, [name] per [facility] and updated him on  Aspergillus mold result per Dr Elsworth Soho. All questions answered and he expressed understanding. Nothing further needed at this time.

## 2019-12-31 NOTE — Telephone Encounter (Signed)
LMTCB x1 for pt's husband.

## 2020-01-08 NOTE — Telephone Encounter (Signed)
Pt has been called about results. Nothing further needed at this time.

## 2020-01-14 NOTE — Telephone Encounter (Signed)
Dr. Alva, Please see patient comment and advise.  Thank you 

## 2020-01-14 NOTE — Telephone Encounter (Signed)
Dr. Elsworth Soho recommendations were forwarded to pt. Nothing further needed at this time.

## 2020-01-14 NOTE — Telephone Encounter (Signed)
Since she has a clear x-ray, no further testing recommended from our standpoint. She can perhaps ask landlord to get an assessment of the home done by Designer, multimedia or other professional for mold

## 2020-01-26 NOTE — Telephone Encounter (Signed)
RA pt had a few other questions to run by you.  Please advise. Thanks

## 2020-01-26 NOTE — Telephone Encounter (Signed)
No other medical testing required

## 2020-02-05 ENCOUNTER — Ambulatory Visit (INDEPENDENT_AMBULATORY_CARE_PROVIDER_SITE_OTHER): Payer: 59 | Admitting: Gastroenterology

## 2020-02-05 ENCOUNTER — Encounter: Payer: Self-pay | Admitting: Gastroenterology

## 2020-02-05 VITALS — BP 96/70 | HR 76 | Ht 59.0 in | Wt 118.1 lb

## 2020-02-05 DIAGNOSIS — R1033 Periumbilical pain: Secondary | ICD-10-CM

## 2020-02-05 DIAGNOSIS — K5904 Chronic idiopathic constipation: Secondary | ICD-10-CM | POA: Diagnosis not present

## 2020-02-05 DIAGNOSIS — K219 Gastro-esophageal reflux disease without esophagitis: Secondary | ICD-10-CM | POA: Diagnosis not present

## 2020-02-05 MED ORDER — FAMOTIDINE 20 MG PO TABS: 20.0000 mg | ORAL_TABLET | Freq: Every day | ORAL | 11 refills | Status: DC

## 2020-02-05 MED ORDER — DICYCLOMINE HCL 10 MG PO CAPS: 10.0000 mg | ORAL_CAPSULE | Freq: Three times a day (TID) | ORAL | 11 refills | Status: DC | PRN

## 2020-02-05 NOTE — Patient Instructions (Signed)
We have sent the following medications to your pharmacy for you to pick up at your convenience: famotidine 20 mg daily and dicyclomine 10 mg one tablet by mouth three times daily as needed.   You can take over the counter Metamucil or Citracel daily for 2 weeks. If you see no improvement in your symptoms then start Miralax daily for constipation.   You can also take over the counter Gas-x three times a day as needed for gas and bloating.  Thank you for choosing me and [facility].  Pricilla Riffle. Dagoberto Ligas., MD., Marval Regal

## 2020-02-05 NOTE — Progress Notes (Signed)
History of Present Illness: This is a 59 year old female referred by Colon Branch, MD for the evaluation of constipation, periumbilical abdominal pain, abdominal bloating, GERD. She is accompanied by her husband who has several question as well. She has similar complaints to her visit in November 2019. She relates frequent problems with constipation, abdominal bloating, gas and periumbilical abdominal pain. She states that at times she can go for several days without any symptoms and at times her symptoms will be present for many days at a time. She is tried increasing her dietary fiber and water intake without good results. She relates that dicyclomine was helpful in the past. Her reflux symptoms are well controlled with famotidine daily as needed. Colonoscopy performed in October 2021 showed mild scattered diverticulosis and an 8 mm tubular adenoma in the cecum.   Allergies  Allergen Reactions  . Clindamycin Diarrhea   Outpatient Medications Prior to Visit  Medication Sig Dispense Refill  . co-enzyme Q-10 30 MG capsule Take 30 mg by mouth daily.    Marland Kitchen ibandronate (BONIVA) 150 MG tablet Take 1 tablet (150 mg total) by mouth every 30 (thirty) days. 3 tablet 3  . ibuprofen (ADVIL,MOTRIN) 200 MG tablet Take 200 mg by mouth every 6 (six) hours as needed.    . famotidine (PEPCID) 20 MG tablet Take 1 tablet (20 mg total) by mouth daily. 30 tablet 11   No facility-administered medications prior to visit.   Past Medical History:  Diagnosis Date  . Adenomatous colon polyp   . Allergy   . Asthma 1990's  . GERD (gastroesophageal reflux disease)   . Headache(784.0)   . Palpitations   . Pneumonia   . Tuberculosis 1990's   Past Surgical History:  Procedure Laterality Date  . COLONOSCOPY  08/31/2009  . COLONOSCOPY  12/12/2019  . COLPOSCOPY  04/28/2016  . OVARIAN CYST REMOVAL     Social History   Socioeconomic History  . Marital status: Married    Spouse name: Not on file  . Number of  children: 0  . Years of education: Not on file  . Highest education level: Not on file  Occupational History  . Occupation: Scientist, water quality [employer]  Tobacco Use  . Smoking status: Never Smoker  . Smokeless tobacco: Never Used  Vaping Use  . Vaping Use: Never used  Substance and Sexual Activity  . Alcohol use: Yes    Comment: occasional wine  . Drug use: No  . Sexual activity: Not on file  Other Topics Concern  . Not on file  Social History Narrative   From [place] Determinants of Health   Financial Resource Strain:   . Difficulty of Paying Living Expenses: Not on file  Food Insecurity:   . Worried About Charity fundraiser in the Last Year: Not on file  . Ran Out of Food in the Last Year: Not on file  Transportation Needs:   . Lack of Transportation (Medical): Not on file  . Lack of Transportation (Non-Medical): Not on file  Physical Activity:   . Days of Exercise per Week: Not on file  . Minutes of Exercise per Session: Not on file  Stress:   . Feeling of Stress : Not on file  Social Connections:   . Frequency of Communication with Friends and Family: Not on file  . Frequency of Social Gatherings with Friends and Family: Not on file  . Attends Religious Services: Not on file  .  Active Member of Clubs or Organizations: Not on file  . Attends Archivist Meetings: Not on file  . Marital Status: Not on file   Family History  Problem Relation Age of Onset  . Hypertension Mother   . High Cholesterol Mother   . Diabetes Paternal Grandfather   . Atrial fibrillation Father        pacemaker  . Other Father        blood clotting disorder  . Hyperlipidemia Brother   . Heart disease Other   . Hypertension Other   . Hyperlipidemia Other   . Hypertension Other   . Colon cancer Neg Hx   . Breast cancer Neg Hx   . Colon polyps Neg Hx        Physical Exam: General: Well developed, well nourished, no acute distress Head: Normocephalic and  atraumatic Eyes:  sclerae anicteric, EOMI Ears: Normal auditory acuity Mouth: Not examined, mask on during Covid-19 pandemic Neck: Supple, no masses or thyromegaly Lungs: Clear throughout to auscultation Heart: Regular rate and rhythm; no murmurs, rubs or bruits Abdomen: Soft, non tender and non distended. No masses, hepatosplenomegaly or hernias noted. Normal Bowel sounds Rectal: Performed at recent colonoscopy, not repeated Musculoskeletal: Symmetrical with no gross deformities  Skin: No lesions on visible extremities Pulses:  Normal pulses noted Extremities: No clubbing, cyanosis, edema or deformities noted Neurological: Alert oriented x 4, grossly nonfocal Cervical Nodes:  No significant cervical adenopathy Inguinal Nodes: No significant inguinal adenopathy Psychological:  Alert and cooperative. Normal mood and affect   Assessment and Recommendations:  1. Constipation, abdominal bloating, periumbilical abdominal pain, gas. CIC vs IBS-C. Continue with high-fiber diet and adequate daily fluid intake. Add Metamucil or Citrucel daily. If symptoms not controlled add MiraLAX once every other day to twice daily titrated for a complete bowel movement daily. Gas-X, or similar product, 3 times daily as needed. Resume dicyclomine 10 mg p.o. 3 times daily as needed abdominal pain, abdominal bloating. Patient is advised to call in 1 month if her symptoms are not under good control for further advice. I addressed the patient's and her husband's questions to their satisfaction.  2. GERD. Follow antireflux measures. Famotidine 20 mg daily as needed.  3. Personal history of adenomatous colon polyps. One 8 mm polyp on her most recent colonoscopy. 7-year interval surveillance colonoscopy is recommended in October 2028.  4. Mild colonic diverticulosis. High-fiber diet with adequate daily water intake as above.   cc: Colon Branch, MD [address] [place],  [place] [address]

## 2020-03-06 HISTORY — PX: APPENDECTOMY: SHX54

## 2020-05-11 ENCOUNTER — Other Ambulatory Visit (HOSPITAL_COMMUNITY): Payer: 59

## 2020-05-14 ENCOUNTER — Ambulatory Visit: Payer: 59

## 2020-05-25 ENCOUNTER — Other Ambulatory Visit (HOSPITAL_COMMUNITY): Payer: 59

## 2020-05-28 LAB — HM MAMMOGRAPHY

## 2020-06-15 ENCOUNTER — Other Ambulatory Visit (HOSPITAL_COMMUNITY): Payer: 59

## 2020-06-18 ENCOUNTER — Emergency Department (HOSPITAL_COMMUNITY): Payer: 59 | Admitting: Certified Registered"

## 2020-06-18 ENCOUNTER — Encounter (HOSPITAL_COMMUNITY)
Admission: EM | Disposition: A | Discharge status: Home / Self Care | Payer: Self-pay | Source: Home / Self Care | Attending: Emergency Medicine

## 2020-06-18 ENCOUNTER — Ambulatory Visit (HOSPITAL_COMMUNITY)
Admission: EM | Admit: 2020-06-18 | Discharge: 2020-06-18 | Disposition: A | Discharge status: Home / Self Care | Payer: 59 | Attending: Emergency Medicine | Admitting: Emergency Medicine

## 2020-06-18 ENCOUNTER — Other Ambulatory Visit: Payer: Self-pay

## 2020-06-18 ENCOUNTER — Encounter (HOSPITAL_COMMUNITY): Payer: Self-pay | Admitting: Pharmacy Technician

## 2020-06-18 ENCOUNTER — Emergency Department (HOSPITAL_COMMUNITY): Payer: 59

## 2020-06-18 DIAGNOSIS — K3533 Acute appendicitis with perforation and localized peritonitis, with abscess: Secondary | ICD-10-CM | POA: Diagnosis not present

## 2020-06-18 DIAGNOSIS — K353 Acute appendicitis with localized peritonitis, without perforation or gangrene: Secondary | ICD-10-CM

## 2020-06-18 DIAGNOSIS — Z86010 Personal history of colonic polyps: Secondary | ICD-10-CM | POA: Diagnosis not present

## 2020-06-18 DIAGNOSIS — Z881 Allergy status to other antibiotic agents status: Secondary | ICD-10-CM | POA: Diagnosis not present

## 2020-06-18 DIAGNOSIS — Z20822 Contact with and (suspected) exposure to covid-19: Secondary | ICD-10-CM | POA: Insufficient documentation

## 2020-06-18 HISTORY — PX: LAPAROSCOPIC APPENDECTOMY: SHX408

## 2020-06-18 LAB — URINALYSIS, ROUTINE W REFLEX MICROSCOPIC
Bacteria, UA: NONE SEEN
Bilirubin Urine: NEGATIVE
Glucose, UA: NEGATIVE mg/dL
Ketones, ur: NEGATIVE mg/dL
Leukocytes,Ua: NEGATIVE
Nitrite: NEGATIVE
Protein, ur: NEGATIVE mg/dL
Specific Gravity, Urine: >1.046 — ABNORMAL HIGH (ref 1.005–1.030)
pH: 7 (ref 5.0–8.0)

## 2020-06-18 LAB — CBC WITH DIFFERENTIAL/PLATELET
Abs Immature Granulocytes: 0.05 10*3/uL (ref 0.00–0.07)
Basophils Absolute: 0 10*3/uL (ref 0.0–0.1)
Basophils Relative: 0 %
Eosinophils Absolute: 0 10*3/uL (ref 0.0–0.5)
Eosinophils Relative: 0 %
HCT: 37.1 % (ref 36.0–46.0)
Hemoglobin: 12.3 g/dL (ref 12.0–15.0)
Immature Granulocytes: 0 %
Lymphocytes Relative: 12 %
Lymphs Abs: 1.6 10*3/uL (ref 0.7–4.0)
MCH: 31.1 pg (ref 26.0–34.0)
MCHC: 33.2 g/dL (ref 30.0–36.0)
MCV: 93.9 fL (ref 80.0–100.0)
Monocytes Absolute: 0.7 10*3/uL (ref 0.1–1.0)
Monocytes Relative: 6 %
Neutro Abs: 10.9 10*3/uL — ABNORMAL HIGH (ref 1.7–7.7)
Neutrophils Relative %: 82 %
Platelets: 244 10*3/uL (ref 150–400)
RBC: 3.95 MIL/uL (ref 3.87–5.11)
RDW: 13.4 % (ref 11.5–15.5)
WBC: 13.3 10*3/uL — ABNORMAL HIGH (ref 4.0–10.5)
nRBC: 0 % (ref 0.0–0.2)

## 2020-06-18 LAB — RESP PANEL BY RT-PCR (FLU A&B, COVID) ARPGX2
Influenza A by PCR: NEGATIVE
Influenza B by PCR: NEGATIVE
SARS Coronavirus 2 by RT PCR: NEGATIVE

## 2020-06-18 LAB — LIPASE, BLOOD: Lipase: 27 U/L (ref 11–51)

## 2020-06-18 LAB — COMPREHENSIVE METABOLIC PANEL
ALT: 26 U/L (ref 0–44)
AST: 32 U/L (ref 15–41)
Albumin: 3.7 g/dL (ref 3.5–5.0)
Alkaline Phosphatase: 55 U/L (ref 38–126)
Anion gap: 5 (ref 5–15)
BUN: 8 mg/dL (ref 6–20)
CO2: 26 mmol/L (ref 22–32)
Calcium: 8.6 mg/dL — ABNORMAL LOW (ref 8.9–10.3)
Chloride: 105 mmol/L (ref 98–111)
Creatinine, Ser: 0.57 mg/dL (ref 0.44–1.00)
GFR, Estimated: >60 mL/min (ref >60)
Glucose, Bld: 111 mg/dL — ABNORMAL HIGH (ref 70–99)
Potassium: 3.3 mmol/L — ABNORMAL LOW (ref 3.5–5.1)
Sodium: 136 mmol/L (ref 135–145)
Total Bilirubin: 1 mg/dL (ref 0.3–1.2)
Total Protein: 6.8 g/dL (ref 6.5–8.1)

## 2020-06-18 SURGERY — APPENDECTOMY, LAPAROSCOPIC
Anesthesia: General | Site: Abdomen

## 2020-06-18 MED ORDER — CHLORHEXIDINE GLUCONATE 0.12 % MT SOLN
15.0000 mL | OROMUCOSAL | Status: AC
  Administered 2020-06-18: 15 mL via OROMUCOSAL
  Filled 2020-06-18 (×2): qty 15

## 2020-06-18 MED ORDER — HYDROMORPHONE HCL 1 MG/ML IJ SOLN
INTRAMUSCULAR | Status: AC
  Filled 2020-06-18: qty 1

## 2020-06-18 MED ORDER — HYDROMORPHONE HCL 1 MG/ML IJ SOLN
0.2500 mg | INTRAMUSCULAR | Status: DC | PRN
  Administered 2020-06-18: 0.25 mg via INTRAVENOUS

## 2020-06-18 MED ORDER — DEXAMETHASONE SODIUM PHOSPHATE 10 MG/ML IJ SOLN
INTRAMUSCULAR | Status: DC | PRN
  Administered 2020-06-18: 5 mg via INTRAVENOUS

## 2020-06-18 MED ORDER — LIDOCAINE 2% (20 MG/ML) 5 ML SYRINGE
INTRAMUSCULAR | Status: DC | PRN
  Administered 2020-06-18: 60 mg via INTRAVENOUS

## 2020-06-18 MED ORDER — SODIUM CHLORIDE 0.9 % IV SOLN
2.0000 g | Freq: Once | INTRAVENOUS | Status: AC
  Administered 2020-06-18: 2 g via INTRAVENOUS
  Filled 2020-06-18: qty 20

## 2020-06-18 MED ORDER — PROPOFOL 10 MG/ML IV BOLUS
INTRAVENOUS | Status: DC | PRN
  Administered 2020-06-18: 100 mg via INTRAVENOUS

## 2020-06-18 MED ORDER — SODIUM CHLORIDE 0.9 % IV BOLUS
1000.0000 mL | Freq: Once | INTRAVENOUS | Status: AC
  Administered 2020-06-18: 1000 mL via INTRAVENOUS

## 2020-06-18 MED ORDER — BUPIVACAINE-EPINEPHRINE (PF) 0.25% -1:200000 IJ SOLN
INTRAMUSCULAR | Status: AC
  Filled 2020-06-18: qty 30

## 2020-06-18 MED ORDER — METRONIDAZOLE IN NACL 5-0.79 MG/ML-% IV SOLN
500.0000 mg | Freq: Once | INTRAVENOUS | Status: AC
  Administered 2020-06-18: 500 mg via INTRAVENOUS
  Filled 2020-06-18: qty 100

## 2020-06-18 MED ORDER — LIDOCAINE 2% (20 MG/ML) 5 ML SYRINGE
INTRAMUSCULAR | Status: AC
  Filled 2020-06-18: qty 10

## 2020-06-18 MED ORDER — IOHEXOL 300 MG/ML  SOLN
100.0000 mL | Freq: Once | INTRAMUSCULAR | Status: AC | PRN
  Administered 2020-06-18: 100 mL via INTRAVENOUS

## 2020-06-18 MED ORDER — FENTANYL CITRATE (PF) 100 MCG/2ML IJ SOLN
INTRAMUSCULAR | Status: DC | PRN
  Administered 2020-06-18: 50 ug via INTRAVENOUS
  Administered 2020-06-18: 100 ug via INTRAVENOUS

## 2020-06-18 MED ORDER — SUCCINYLCHOLINE CHLORIDE 200 MG/10ML IV SOSY
PREFILLED_SYRINGE | INTRAVENOUS | Status: DC | PRN
  Administered 2020-06-18: 80 mg via INTRAVENOUS

## 2020-06-18 MED ORDER — MIDAZOLAM HCL 2 MG/2ML IJ SOLN
INTRAMUSCULAR | Status: AC
  Filled 2020-06-18: qty 2

## 2020-06-18 MED ORDER — ONDANSETRON HCL 4 MG/2ML IJ SOLN
4.0000 mg | Freq: Once | INTRAMUSCULAR | Status: AC
  Administered 2020-06-18: 4 mg via INTRAVENOUS
  Filled 2020-06-18: qty 2

## 2020-06-18 MED ORDER — BUPIVACAINE-EPINEPHRINE 0.25% -1:200000 IJ SOLN
INTRAMUSCULAR | Status: DC | PRN
  Administered 2020-06-18: 20 mL

## 2020-06-18 MED ORDER — MIDAZOLAM HCL 5 MG/5ML IJ SOLN
INTRAMUSCULAR | Status: DC | PRN
  Administered 2020-06-18: 2 mg via INTRAVENOUS

## 2020-06-18 MED ORDER — ROCURONIUM BROMIDE 10 MG/ML (PF) SYRINGE
PREFILLED_SYRINGE | INTRAVENOUS | Status: AC
  Filled 2020-06-18: qty 10

## 2020-06-18 MED ORDER — LACTATED RINGERS IV SOLN: INTRAVENOUS | Status: DC

## 2020-06-18 MED ORDER — IBUPROFEN 800 MG PO TABS: 800.0000 mg | ORAL_TABLET | Freq: Three times a day (TID) | ORAL | 1 refills | Status: DC | PRN

## 2020-06-18 MED ORDER — ONDANSETRON HCL 4 MG/2ML IJ SOLN
INTRAMUSCULAR | Status: DC | PRN
  Administered 2020-06-18: 4 mg via INTRAVENOUS

## 2020-06-18 MED ORDER — METOCLOPRAMIDE HCL 5 MG/ML IJ SOLN
5.0000 mg | Freq: Once | INTRAMUSCULAR | Status: AC
  Administered 2020-06-18: 5 mg via INTRAVENOUS
  Filled 2020-06-18: qty 1

## 2020-06-18 MED ORDER — ROCURONIUM BROMIDE 10 MG/ML (PF) SYRINGE
PREFILLED_SYRINGE | INTRAVENOUS | Status: DC | PRN
  Administered 2020-06-18: 35 mg via INTRAVENOUS

## 2020-06-18 MED ORDER — 0.9 % SODIUM CHLORIDE (POUR BTL) OPTIME
TOPICAL | Status: DC | PRN
  Administered 2020-06-18: 1000 mL

## 2020-06-18 MED ORDER — SUGAMMADEX SODIUM 200 MG/2ML IV SOLN
INTRAVENOUS | Status: DC | PRN
  Administered 2020-06-18: 200 mg via INTRAVENOUS

## 2020-06-18 MED ORDER — DOCUSATE SODIUM 100 MG PO CAPS: 100.0000 mg | ORAL_CAPSULE | Freq: Two times a day (BID) | ORAL | 2 refills | Status: AC

## 2020-06-18 MED ORDER — FENTANYL CITRATE (PF) 100 MCG/2ML IJ SOLN
25.0000 ug | Freq: Once | INTRAMUSCULAR | Status: AC
Start: 2020-06-18 — End: 2020-06-18
  Administered 2020-06-18: 25 ug via INTRAVENOUS
  Filled 2020-06-18: qty 2

## 2020-06-18 MED ORDER — ACETAMINOPHEN 325 MG PO TABS
650.0000 mg | ORAL_TABLET | Freq: Once | ORAL | Status: AC
  Administered 2020-06-18: 650 mg via ORAL
  Filled 2020-06-18: qty 2

## 2020-06-18 MED ORDER — METOCLOPRAMIDE HCL 5 MG/ML IJ SOLN
INTRAMUSCULAR | Status: AC
  Filled 2020-06-18: qty 2

## 2020-06-18 MED ORDER — PHENYLEPHRINE 40 MCG/ML (10ML) SYRINGE FOR IV PUSH (FOR BLOOD PRESSURE SUPPORT)
PREFILLED_SYRINGE | INTRAVENOUS | Status: AC
  Filled 2020-06-18: qty 10

## 2020-06-18 MED ORDER — SODIUM CHLORIDE 0.9 % IR SOLN
Status: DC | PRN
  Administered 2020-06-18: 1000 mL

## 2020-06-18 MED ORDER — PHENYLEPHRINE 40 MCG/ML (10ML) SYRINGE FOR IV PUSH (FOR BLOOD PRESSURE SUPPORT)
PREFILLED_SYRINGE | INTRAVENOUS | Status: DC | PRN
  Administered 2020-06-18 (×2): 80 ug via INTRAVENOUS

## 2020-06-18 MED ORDER — OXYCODONE HCL 5 MG PO TABS: 5.0000 mg | ORAL_TABLET | Freq: Three times a day (TID) | ORAL | 0 refills | Status: DC | PRN

## 2020-06-18 MED ORDER — FENTANYL CITRATE (PF) 250 MCG/5ML IJ SOLN
INTRAMUSCULAR | Status: AC
  Filled 2020-06-18: qty 5

## 2020-06-18 SURGICAL SUPPLY — 46 items
APPLIER CLIP ROT 10 11.4 M/L (STAPLE)
BLADE CLIPPER SURG (BLADE) IMPLANT
CANISTER SUCT 3000ML PPV (MISCELLANEOUS) IMPLANT
CHLORAPREP W/TINT 26 (MISCELLANEOUS) ×2 IMPLANT
CLIP APPLIE ROT 10 11.4 M/L (STAPLE) IMPLANT
COVER SURGICAL LIGHT HANDLE (MISCELLANEOUS) ×2 IMPLANT
COVER WAND RF STERILE (DRAPES) IMPLANT
CUTTER FLEX LINEAR 45M (STAPLE) ×2 IMPLANT
DERMABOND ADHESIVE PROPEN (GAUZE/BANDAGES/DRESSINGS) ×1
DERMABOND ADVANCED .7 DNX6 (GAUZE/BANDAGES/DRESSINGS) ×1 IMPLANT
ELECT CAUTERY BLADE 6.4 (BLADE) ×2 IMPLANT
ELECT REM PT RETURN 9FT ADLT (ELECTROSURGICAL) ×2
ELECTRODE REM PT RTRN 9FT ADLT (ELECTROSURGICAL) ×1 IMPLANT
ENDOLOOP SUT PDS II  0 18 (SUTURE)
ENDOLOOP SUT PDS II 0 18 (SUTURE) IMPLANT
GLOVE BIO SURGEON STRL SZ 6.5 (GLOVE) ×2 IMPLANT
GLOVE BIOGEL PI IND STRL 6 (GLOVE) ×1 IMPLANT
GLOVE BIOGEL PI INDICATOR 6 (GLOVE) ×1
GOWN STRL REUS W/ TWL LRG LVL3 (GOWN DISPOSABLE) ×3 IMPLANT
GOWN STRL REUS W/TWL LRG LVL3 (GOWN DISPOSABLE) ×6
KIT BASIN OR (CUSTOM PROCEDURE TRAY) ×2 IMPLANT
KIT TURNOVER KIT B (KITS) ×2 IMPLANT
NEEDLE INSUFFLATION 14GA 120MM (NEEDLE) IMPLANT
NS IRRIG 1000ML POUR BTL (IV SOLUTION) ×2 IMPLANT
PAD ARMBOARD 7.5X6 YLW CONV (MISCELLANEOUS) ×4 IMPLANT
PENCIL BUTTON HOLSTER BLD 10FT (ELECTRODE) ×2 IMPLANT
POUCH SPECIMEN RETRIEVAL 10MM (ENDOMECHANICALS) ×2 IMPLANT
RELOAD 45 VASCULAR/THIN (ENDOMECHANICALS) ×4 IMPLANT
RELOAD STAPLE TA45 3.5 REG BLU (ENDOMECHANICALS) ×2 IMPLANT
RELOAD STAPLER BLUE 60MM (STAPLE) ×2 IMPLANT
SCISSORS LAP 5X35 DISP (ENDOMECHANICALS) IMPLANT
SET IRRIG TUBING LAPAROSCOPIC (IRRIGATION / IRRIGATOR) ×2 IMPLANT
SET TUBE SMOKE EVAC HIGH FLOW (TUBING) ×2 IMPLANT
SHEARS HARMONIC ACE PLUS 36CM (ENDOMECHANICALS) IMPLANT
SLEEVE ENDOPATH XCEL 5M (ENDOMECHANICALS) ×2 IMPLANT
SPECIMEN JAR SMALL (MISCELLANEOUS) ×2 IMPLANT
STAPLE ECHEON FLEX 60 POW ENDO (STAPLE) ×2 IMPLANT
STAPLER RELOAD BLUE 60MM (STAPLE) ×4
SUT MNCRL AB 4-0 PS2 18 (SUTURE) ×2 IMPLANT
SUT VICRYL 0 UR6 27IN ABS (SUTURE) ×4 IMPLANT
TOWEL GREEN STERILE (TOWEL DISPOSABLE) ×2 IMPLANT
TOWEL GREEN STERILE FF (TOWEL DISPOSABLE) ×2 IMPLANT
TRAY LAPAROSCOPIC MC (CUSTOM PROCEDURE TRAY) ×2 IMPLANT
TROCAR XCEL BLUNT TIP 100MML (ENDOMECHANICALS) IMPLANT
TROCAR XCEL NON-BLD 5MMX100MML (ENDOMECHANICALS) ×2 IMPLANT
WATER STERILE IRR 1000ML POUR (IV SOLUTION) ×2 IMPLANT

## 2020-06-18 NOTE — Anesthesia Procedure Notes (Signed)
Procedure Name: Intubation Date/Time: 06/18/2020 5:18 PM Performed by: Moshe Salisbury, CRNA Pre-anesthesia Checklist: Patient identified, Emergency Drugs available, Suction available and Patient being monitored Patient Re-evaluated:Patient Re-evaluated prior to induction Oxygen Delivery Method: Circle System Utilized Preoxygenation: Pre-oxygenation with 100% oxygen Induction Type: IV induction and Rapid sequence Ventilation: Mask ventilation without difficulty Laryngoscope Size: Mac and 3 Grade View: Grade I Tube type: Oral Tube size: 7.5 mm Number of attempts: 1 Airway Equipment and Method: Stylet Placement Confirmation: ETT inserted through vocal cords under direct vision,  positive ETCO2 and breath sounds checked- equal and bilateral Secured at: 20 cm Tube secured with: Tape Dental Injury: Teeth and Oropharynx as per pre-operative assessment

## 2020-06-18 NOTE — ED Triage Notes (Signed)
Pt arrives pov with reports of RLQ abdominal pain onset yesterday afternoon. Pt states pain became more intense after eating dinner. Took home medications without relief. Denies NVD or fevers.

## 2020-06-18 NOTE — Op Note (Addendum)
   Operative Note   Date: 06/18/2020  Procedure: laparoscopic appendectomy  Pre-op diagnosis: acute appendicitis Post-op diagnosis: Grade 1b appendicitis: moderate to severely inflamed appendix  Indication and clinical history: The patient is a 60 y.o. year old female with acute appendicitis     Surgeon: Jesusita Oka, MD  Anesthesiologist: Ola Spurr, MD Anesthesia: General  Findings:  . Specimen: appendix . EBL: <5cc . Drains/Implants: none  Disposition: PACU - hemodynamically stable.   Description of procedure: The patient was positioned supine on the operating room table. Time-out was performed verifying correct patient, procedure, signature of informed consent, and administration of pre-operative antibiotics. General anesthetic induction and intubation were uneventful. Foley catheter insertion was not performed as patient voided immediately prior to the procedure . The abdomen was prepped and draped in the usual sterile fashion. An infra-umbilical incision was made using an open technique using zero vicryl stay sutures on either side of the fascia and a 65mm Hassan port inserted. After establishing pneumoperitoneum, which the patient tolerated well, the abdominal cavity was inspected and no injury of any intra-abdominal structures was identified. Two additional five millimeter ports were placed under direct visualization and using local anesthetic in the suprapubic and left lower quadrant regions. The patient was repositioned to Trendelenburg with the left side down. Further inspection of the right lower quadrant revealed no abscess or purulent fluid and grade 1b appendicitis. The appendix was dissected away from its mesoappendix and an endoscopic stapler used to divide the mesoappendix using a vascular load. A bowel load of the endoscopic stapler was used to staple across the appendix at its base. The staple line was inspected and found to have a small mucosal defect. An additional  staple load was used to come across this base of the cecum and after inspection was found to be intact. The vascular staple line was also inspected and found to be intact and without bleeding. The appendix was placed in an endoscopic specimen retrieval bag, removed via the umbilical port site, and sent to pathology as a specimen. The right lower quadrant was again inspected and hemostasis confirmed. Any remaining fluid identified was suctioned. The suprapubic and left lower quadrant ports were removed under direct visualization and hemostasis confirmed. The umbilical port was removed last after desufflating the abdomen and the fascia re-approximated using the stay sutures. Additional local anesthetic was administered at the umbilical incision site. The skin of all port sites was closed with 4-0 monocryl. Sterile dressings were applied. All sponge and instrument counts were correct at the conclusion of the procedure. The patient was awakened from anesthesia, extubated uneventfully, and transported to the PACU in good condition. There were no complications.   Upon entering the abdomen (organ space), I encountered infection of the appendix.  CASE DATA:  Type of patient?: DOW CASE (Surgical Hospitalist [facility])  Status of Case? URGENT Add On  Infection Present At Time Of Surgery (PATOS)?  INFECTION of the appendix   Jesusita Oka, MD General and [facility]

## 2020-06-18 NOTE — H&P (Signed)
Reason for Consult/Chief Complaint: acute appendicitis Consultant: Nyoka Cowden, PA  [name] is an 60 y.o. female.   HPI: 13F with abdominal pain x1 day that began after dinner. Pain prevented her from sleeping. Associated with nausea, but no vomiting. Denies fevers, but does report chills. Endorses loose stools. Prior open ovarian cyst removal in 1990s in [place].   Past Medical History:  Diagnosis Date  . Adenomatous colon polyp   . Allergy   . Asthma 1990's  . GERD (gastroesophageal reflux disease)   . Headache(784.0)   . Palpitations   . Pneumonia   . Tuberculosis 1990's    Past Surgical History:  Procedure Laterality Date  . COLONOSCOPY  08/31/2009  . COLONOSCOPY  12/12/2019  . COLPOSCOPY  04/28/2016  . OVARIAN CYST REMOVAL      Family History  Problem Relation Age of Onset  . Hypertension Mother   . High Cholesterol Mother   . Diabetes Paternal Grandfather   . Atrial fibrillation Father        pacemaker  . Other Father        blood clotting disorder  . Hyperlipidemia Brother   . Heart disease Other   . Hypertension Other   . Hyperlipidemia Other   . Hypertension Other   . Colon cancer Neg Hx   . Breast cancer Neg Hx   . Colon polyps Neg Hx     Social History:  reports that she has never smoked. She has never used smokeless tobacco. She reports current alcohol use. She reports that she does not use drugs.  Allergies:  Allergies  Allergen Reactions  . Clindamycin Diarrhea    Medications: I have reviewed the patient's current medications.  Results for orders placed or performed during the hospital encounter of 06/18/20 (from the past 48 hour(s))  CBC with Differential     Status: Abnormal   Collection Time: 06/18/20 10:21 AM  Result Value Ref Range   WBC 13.3 (H) 4.0 - 10.5 K/uL   RBC 3.95 3.87 - 5.11 MIL/uL   Hemoglobin 12.3 12.0 - 15.0 g/dL   HCT 37.1 36.0 - 46.0 %   MCV 93.9 80.0 - 100.0 fL   MCH 31.1 26.0 - 34.0 pg   MCHC 33.2 30.0 - 36.0  g/dL   RDW 13.4 11.5 - 15.5 %   Platelets 244 150 - 400 K/uL   nRBC 0.0 0.0 - 0.2 %   Neutrophils Relative % 82 %   Neutro Abs 10.9 (H) 1.7 - 7.7 K/uL   Lymphocytes Relative 12 %   Lymphs Abs 1.6 0.7 - 4.0 K/uL   Monocytes Relative 6 %   Monocytes Absolute 0.7 0.1 - 1.0 K/uL   Eosinophils Relative 0 %   Eosinophils Absolute 0.0 0.0 - 0.5 K/uL   Basophils Relative 0 %   Basophils Absolute 0.0 0.0 - 0.1 K/uL   Immature Granulocytes 0 %   Abs Immature Granulocytes 0.05 0.00 - 0.07 K/uL    Comment: Performed at [facility], [address], [place],  [address]  Comprehensive metabolic panel     Status: Abnormal   Collection Time: 06/18/20 10:21 AM  Result Value Ref Range   Sodium 136 135 - 145 mmol/L   Potassium 3.3 (L) 3.5 - 5.1 mmol/L   Chloride 105 98 - 111 mmol/L   CO2 26 22 - 32 mmol/L   Glucose, Bld 111 (H) 70 - 99 mg/dL    Comment: Glucose reference range applies only  to samples taken after fasting for at least 8 hours.   BUN 8 6 - 20 mg/dL   Creatinine, Ser 0.57 0.44 - 1.00 mg/dL   Calcium 8.6 (L) 8.9 - 10.3 mg/dL   Total Protein 6.8 6.5 - 8.1 g/dL   Albumin 3.7 3.5 - 5.0 g/dL   AST 32 15 - 41 U/L   ALT 26 0 - 44 U/L   Alkaline Phosphatase 55 38 - 126 U/L   Total Bilirubin 1.0 0.3 - 1.2 mg/dL   GFR, Estimated >60 >60 mL/min    Comment: (NOTE) Calculated using the CKD-EPI Creatinine Equation (2021)    Anion gap 5 5 - 15    Comment: Performed at [facility] [address], [place], [place] [address]  Lipase, blood     Status: None   Collection Time: 06/18/20 10:21 AM  Result Value Ref Range   Lipase 27 11 - 51 U/L    Comment: Performed at [facility] [address] [place], [place] [address]  Urinalysis, Routine w reflex microscopic Urine, Clean Catch     Status: Abnormal   Collection Time: 06/18/20  1:45 PM  Result Value Ref Range   Color, Urine STRAW (A) YELLOW   APPearance CLEAR CLEAR   Specific Gravity, Urine >1.046 (H) 1.005 -  1.030   pH 7.0 5.0 - 8.0   Glucose, UA NEGATIVE NEGATIVE mg/dL   Hgb urine dipstick MODERATE (A) NEGATIVE   Bilirubin Urine NEGATIVE NEGATIVE   Ketones, ur NEGATIVE NEGATIVE mg/dL   Protein, ur NEGATIVE NEGATIVE mg/dL   Nitrite NEGATIVE NEGATIVE   Leukocytes,Ua NEGATIVE NEGATIVE   RBC / HPF 6-10 0 - 5 RBC/hpf   Bacteria, UA NONE SEEN NONE SEEN   Squamous Epithelial / LPF 0-5 0 - 5    Comment: Performed at [facility], [place] [address] [place], [place] [address]    CT ABDOMEN PELVIS W CONTRAST  Result Date: 06/18/2020 CLINICAL DATA:  Right lower quadrant abdominal pain starting yesterday afternoon EXAM: CT ABDOMEN AND PELVIS WITH CONTRAST TECHNIQUE: Multidetector CT imaging of the abdomen and pelvis was performed using the standard protocol following bolus administration of intravenous contrast. CONTRAST:  129mL OMNIPAQUE IOHEXOL 300 MG/ML  SOLN COMPARISON:  CT abdomen 03/30/2014 and abdominal ultrasound from 08/25/2016 FINDINGS: Lower chest: Unremarkable Hepatobiliary: Nonspecific 0.8 by 0.5 cm hypodense lesion in the lateral segment left hepatic lobe on image 19 series 3, not readily apparent on 03/30/2014. 1.2 by 0.9 cm densely enhancing lesion in the right hepatic lobe on image 16 of series 3, this was present on 03/30/2014 with appearance favoring hemangioma. Several gallstones are present in the gallbladder measuring up to 2.3 cm in long axis. No gallbladder wall thickening or pericholecystic fluid. No significant biliary dilatation. Pancreas: Unremarkable Spleen: Unremarkable Adrenals/Urinary Tract: Small hypodense right renal lesions were also present on 03/30/2014 and are likely cysts although remain technically too small to characterize. Adrenal glands unremarkable. Stomach/Bowel: Abnormal appendiceal diameter with wall thickening and periappendiceal stranding compatible with acute appendicitis. The appendix is about 1 cm in diameter on image 33 of series 7. There is a small amount of  gas within the appendiceal tip lumen which is a little atypical but overall the appearance is compatible with acute appendicitis. No walled off periappendiceal abscess or extraluminal gas is identified. There is some scattered air-fluid levels in nondilated loops of small bowel. Vascular/Lymphatic: Mild abdominal aortic atherosclerotic calcification. Reproductive: Unremarkable Other: No supplemental non-categorized findings. Musculoskeletal: Unremarkable  IMPRESSION: 1. Acute appendicitis, with appendiceal wall thickening and periappendiceal edema. No abscess or rupture identified. 2. Nonspecific 0.8 by 0.5 cm hypodense lesion in the lateral segment left hepatic lobe. Not readily appreciable on prior study from 03/30/2014. Although likely a benign etiology, if the patient has a history of gastrointestinal malignancy, abnormal liver enzymes, or if otherwise clinically warranted, follow up imaging by hepatic protocol MRI with and without contrast might be considered in the nonurgent setting. 3. Aortic Atherosclerosis (ICD10-I70.0). 4. Cholelithiasis. 5. Right hepatic lobe hemangioma. Electronically Signed   By: Van Clines M.D.   On: 06/18/2020 13:29    ROS 10 point review of systems is negative except as listed above in HPI.   Physical Exam Blood pressure 100/61, pulse 70, temperature 98.1 F (36.7 C), temperature source Oral, resp. rate 11, SpO2 100 %. Constitutional: well-developed, well-nourished HEENT: pupils equal, round, reactive to light, 64mm b/l, moist conjunctiva, external inspection of ears and nose normal, hearing intact Oropharynx: normal oropharyngeal mucosa, normal dentition Neck: no thyromegaly, trachea midline, no midline cervical tenderness to palpation Chest: breath sounds equal bilaterally, normal respiratory effort, no midline or lateral chest wall tenderness to palpation/deformity Abdomen: soft, RLQ TTP, well healed lower midline incision, no bruising, no  hepatosplenomegaly GU: normal female genitalia  Back: no wounds, no thoracic/lumbar spine tenderness to palpation, no thoracic/lumbar spine stepoffs Rectal: deferred Extremities: 2+ radial and pedal pulses bilaterally, motor and sensation intact to bilateral UE and LE, no peripheral edema MSK: normal gait/station, no clubbing/cyanosis of fingers/toes, normal ROM of all four extremities Skin: warm, dry, no rashes Psych: normal memory, normal mood/affect    Assessment/Plan: 51F with acute appendicitis. Informed consent was obtained after detailed explanation of risks, including bleeding, infection, abscess, staple line leak, stump appendicitis, and need for conversion to open procedure. All questions answered to the patient's satisfaction. Husband at bedside, no additional questions. To OR when COVID results back.    Jesusita Oka, MD General and [facility]

## 2020-06-18 NOTE — Anesthesia Postprocedure Evaluation (Signed)
Anesthesia Post Note  Patient: [name]  Procedure(s) Performed: APPENDECTOMY LAPAROSCOPIC (N/A Abdomen)     Patient location during evaluation: PACU Anesthesia Type: General Level of consciousness: awake Pain management: pain level controlled Vital Signs Assessment: post-procedure vital signs reviewed and stable Respiratory status: spontaneous breathing Cardiovascular status: stable Postop Assessment: no apparent nausea or vomiting Anesthetic complications: no   No complications documented.  Last Vitals:  Vitals:   06/18/20 1835 06/18/20 1850  BP: 97/60 (!) 101/54  Pulse: 74 77  Resp: 15 16  Temp:  37.3 C  SpO2: 100% 100%    Last Pain:  Vitals:   06/18/20 1850  TempSrc:   PainSc: 5                  Suhaan Perleberg

## 2020-06-18 NOTE — Transfer of Care (Signed)
Immediate Anesthesia Transfer of Care Note  Patient: [name]  Procedure(s) Performed: APPENDECTOMY LAPAROSCOPIC (N/A Abdomen)  Patient Location: PACU  Anesthesia Type:General  Level of Consciousness: drowsy and patient cooperative  Airway & Oxygen Therapy: Patient Spontanous Breathing and Patient connected to nasal cannula oxygen  Post-op Assessment: Report given to RN, Post -op Vital signs reviewed and stable and Patient moving all extremities  Post vital signs: Reviewed and stable  Last Vitals:  Vitals Value Taken Time  BP    Temp    Pulse 102 06/18/20 1818  Resp 18 06/18/20 1818  SpO2 100 % 06/18/20 1818  Vitals shown include unvalidated device data.  Last Pain:  Vitals:   06/18/20 1402  TempSrc: Oral  PainSc:          Complications: No complications documented.

## 2020-06-18 NOTE — ED Provider Notes (Signed)
[facility] EMERGENCY DEPARTMENT Provider Note   CSN: [id number] Arrival date & time: 06/18/20  0845     History Chief Complaint  Patient presents with  . Abdominal Pain    [name] is a 60 y.o. female with past medical history significant for GERD and chronic idiopathic constipation followed by Dr. Fuller Plan with [facility] who presents the ED with a 1 day history of right lower quadrant abdominal pain.  I reviewed patient's medical records and patient's more recent colonoscopy performed 12/2019 revealed scattered diverticulosis and a 8 mm cecal tubular adenoma.    On my examination, patient is accompanied by her husband who is at bedside.  While Spanish is her first language, she speaks [place] well.  Patient tells me that she developed periumbilical/right-sided abdominal discomfort yesterday.  She tried taking her Bentyl which is helped with her IBS in the past, but there was no relief.  She also took her famotidine for GERD, without improvement.  She states that she thought it would go away and suspected that perhaps it was gas or indigestion.  However, last evening after dinner she developed severe worsening pain in her right lower quadrant.  She states that her pain became an 8 out of 10 with associated nausea.  Patient describes this pain as sharp, waxing and waning.  She had difficulty sleeping last night due to her pain symptoms.  While she usually struggles with chronic constipation, she has had 2-3 episodes of loose, nonbloody stools in the past 12 hours.    She denies any dysuria, hematuria, or other urinary symptoms, fevers or chills, vaginal discharge or bleeding, chest pain or shortness of breath, palpitations, dizziness, or any other symptoms.  Abdominal surgical history notable for ovarian cyst removal roughly 30 years ago in [place].  No history of stones.  HPI     Past Medical History:  Diagnosis Date  . Adenomatous colon polyp   . Allergy   .  Asthma 1990's  . GERD (gastroesophageal reflux disease)   . Headache(784.0)   . Palpitations   . Pneumonia   . Tuberculosis 1990's    Patient Active Problem List   Diagnosis Date Noted  . Eosinophilia 12/19/2019  . Osteoporosis 08/10/2018  . GERD (gastroesophageal reflux disease) 07/11/2017  . Annual physical exam 03/20/2016  . PCP NOTES >>>>>>>>>>> 02/20/2016  . Anxiety state 02/20/2016  . Microscopic hematuria 01/11/2016  . Nocturia 01/11/2016  . Chest pain 03/03/2014  . Palpitations 03/03/2014  . Asthma 07/28/2009  . Constipation, chronic 07/28/2009    Past Surgical History:  Procedure Laterality Date  . COLONOSCOPY  08/31/2009  . COLONOSCOPY  12/12/2019  . COLPOSCOPY  04/28/2016  . OVARIAN CYST REMOVAL       OB History   No obstetric history on file.     Family History  Problem Relation Age of Onset  . Hypertension Mother   . High Cholesterol Mother   . Diabetes Paternal Grandfather   . Atrial fibrillation Father        pacemaker  . Other Father        blood clotting disorder  . Hyperlipidemia Brother   . Heart disease Other   . Hypertension Other   . Hyperlipidemia Other   . Hypertension Other   . Colon cancer Neg Hx   . Breast cancer Neg Hx   . Colon polyps Neg Hx     Social History   Tobacco Use  . Smoking status: Never Smoker  .  Smokeless tobacco: Never Used  Vaping Use  . Vaping Use: Never used  Substance Use Topics  . Alcohol use: Yes    Comment: occasional wine  . Drug use: No    Home Medications Prior to Admission medications   Medication Sig Start Date End Date Taking? Authorizing Provider  calcium-vitamin D 250-100 MG-UNIT tablet Take 1 tablet by mouth in the morning, at noon, and at bedtime.   Yes [provider]  dicyclomine (BENTYL) 10 MG capsule Take 1 capsule (10 mg total) by mouth 3 (three) times daily as needed for spasms. 02/05/20  Yes Ladene Artist, MD  famotidine (PEPCID) 20 MG tablet Take 1 tablet (20 mg  total) by mouth daily. 02/05/20  Yes Ladene Artist, MD  ibandronate (BONIVA) 150 MG tablet Take 1 tablet (150 mg total) by mouth every 30 (thirty) days. 09/01/19  Yes Paz, Alda Berthold, MD  ibuprofen (ADVIL,MOTRIN) 200 MG tablet Take 200 mg by mouth every 6 (six) hours as needed for headache or mild pain.   Yes [provider]    Allergies    Clindamycin  Review of Systems   Review of Systems  All other systems reviewed and are negative.   Physical Exam Updated Vital Signs BP 100/61   Pulse 70   Temp 98.1 F (36.7 C) (Oral)   Resp 11   SpO2 100%   Physical Exam Vitals and nursing note reviewed. Exam conducted with a chaperone present.  Constitutional:      Appearance: She is not toxic-appearing.  HENT:     Head: Normocephalic and atraumatic.  Eyes:     General: No scleral icterus.    Conjunctiva/sclera: Conjunctivae normal.  Cardiovascular:     Rate and Rhythm: Normal rate and regular rhythm.     Comments: Borderline tachycardic.  Regular rhythm. Pulmonary:     Effort: Pulmonary effort is normal. No respiratory distress.  Abdominal:     General: Abdomen is flat. There is no distension.     Palpations: Abdomen is soft. There is no mass.     Tenderness: There is abdominal tenderness.     Comments: Soft, nondistended.  Focal tenderness to palpation over McBurney's point.  No significant tenderness elsewhere.  Positive Rovsing sign.  No overlying skin changes.  Musculoskeletal:     Right lower leg: No edema.     Left lower leg: No edema.  Skin:    General: Skin is dry.  Neurological:     Mental Status: She is alert and oriented to person, place, and time.     GCS: GCS eye subscore is 4. GCS verbal subscore is 5. GCS motor subscore is 6.  Psychiatric:        Mood and Affect: Mood normal.        Behavior: Behavior normal.        Thought Content: Thought content normal.     ED Results / Procedures / Treatments   Labs (all labs ordered are listed, but only  abnormal results are displayed) Labs Reviewed  CBC WITH DIFFERENTIAL/PLATELET - Abnormal; Notable for the following components:      Result Value   WBC 13.3 (*)    Neutro Abs 10.9 (*)    All other components within normal limits  COMPREHENSIVE METABOLIC PANEL - Abnormal; Notable for the following components:   Potassium 3.3 (*)    Glucose, Bld 111 (*)    Calcium 8.6 (*)    All other components within normal limits  URINALYSIS,  ROUTINE W REFLEX MICROSCOPIC - Abnormal; Notable for the following components:   Color, Urine STRAW (*)    Specific Gravity, Urine >1.046 (*)    Hgb urine dipstick MODERATE (*)    All other components within normal limits  RESP PANEL BY RT-PCR (FLU A&B, COVID) ARPGX2  LIPASE, BLOOD    EKG None  Radiology CT ABDOMEN PELVIS W CONTRAST  Result Date: 06/18/2020 CLINICAL DATA:  Right lower quadrant abdominal pain starting yesterday afternoon EXAM: CT ABDOMEN AND PELVIS WITH CONTRAST TECHNIQUE: Multidetector CT imaging of the abdomen and pelvis was performed using the standard protocol following bolus administration of intravenous contrast. CONTRAST:  182mL OMNIPAQUE IOHEXOL 300 MG/ML  SOLN COMPARISON:  CT abdomen 03/30/2014 and abdominal ultrasound from 08/25/2016 FINDINGS: Lower chest: Unremarkable Hepatobiliary: Nonspecific 0.8 by 0.5 cm hypodense lesion in the lateral segment left hepatic lobe on image 19 series 3, not readily apparent on 03/30/2014. 1.2 by 0.9 cm densely enhancing lesion in the right hepatic lobe on image 16 of series 3, this was present on 03/30/2014 with appearance favoring hemangioma. Several gallstones are present in the gallbladder measuring up to 2.3 cm in long axis. No gallbladder wall thickening or pericholecystic fluid. No significant biliary dilatation. Pancreas: Unremarkable Spleen: Unremarkable Adrenals/Urinary Tract: Small hypodense right renal lesions were also present on 03/30/2014 and are likely cysts although remain technically too  small to characterize. Adrenal glands unremarkable. Stomach/Bowel: Abnormal appendiceal diameter with wall thickening and periappendiceal stranding compatible with acute appendicitis. The appendix is about 1 cm in diameter on image 33 of series 7. There is a small amount of gas within the appendiceal tip lumen which is a little atypical but overall the appearance is compatible with acute appendicitis. No walled off periappendiceal abscess or extraluminal gas is identified. There is some scattered air-fluid levels in nondilated loops of small bowel. Vascular/Lymphatic: Mild abdominal aortic atherosclerotic calcification. Reproductive: Unremarkable Other: No supplemental non-categorized findings. Musculoskeletal: Unremarkable IMPRESSION: 1. Acute appendicitis, with appendiceal wall thickening and periappendiceal edema. No abscess or rupture identified. 2. Nonspecific 0.8 by 0.5 cm hypodense lesion in the lateral segment left hepatic lobe. Not readily appreciable on prior study from 03/30/2014. Although likely a benign etiology, if the patient has a history of gastrointestinal malignancy, abnormal liver enzymes, or if otherwise clinically warranted, follow up imaging by hepatic protocol MRI with and without contrast might be considered in the nonurgent setting. 3. Aortic Atherosclerosis (ICD10-I70.0). 4. Cholelithiasis. 5. Right hepatic lobe hemangioma. Electronically Signed   By: Van Clines M.D.   On: 06/18/2020 13:29    Procedures Procedures   Medications Ordered in ED Medications  cefTRIAXone (ROCEPHIN) 2 g in sodium chloride 0.9 % 100 mL IVPB (0 g Intravenous Stopped 06/18/20 1418)    And  metroNIDAZOLE (FLAGYL) IVPB 500 mg (500 mg Intravenous New Bag/Given 06/18/20 1442)  ondansetron (ZOFRAN) injection 4 mg (4 mg Intravenous Given 06/18/20 1021)  acetaminophen (TYLENOL) tablet 650 mg (650 mg Oral Given 06/18/20 1022)  sodium chloride 0.9 % bolus 1,000 mL (0 mLs Intravenous Stopped 06/18/20 1120)   sodium chloride 0.9 % bolus 1,000 mL (1,000 mLs Intravenous New Bag/Given 06/18/20 1347)  iohexol (OMNIPAQUE) 300 MG/ML solution 100 mL (100 mLs Intravenous Contrast Given 06/18/20 1258)  fentaNYL (SUBLIMAZE) injection 25 mcg (25 mcg Intravenous Given 06/18/20 1352)    ED Course  I have reviewed the triage vital signs and the nursing notes.  Pertinent labs & imaging results that were available during my care of the patient were reviewed by  me and considered in my medical decision making (see chart for details).    MDM Rules/Calculators/A&P                          ARISBETH PURRINGTON was evaluated in Emergency Department on 06/18/2020 for the symptoms described in the history of present illness. She was evaluated in the context of the global COVID-19 pandemic, which necessitated consideration that the patient might be at risk for infection with the SARS-CoV-2 virus that causes COVID-19. Institutional protocols and algorithms that pertain to the evaluation of patients at risk for COVID-19 are in a state of rapid change based on information released by regulatory bodies including the CDC and federal and state organizations. These policies and algorithms were followed during the patient's care in the ED.  I personally reviewed patient's medical chart and all notes from triage and staff during today's encounter. I have also ordered and reviewed all labs and imaging that I felt to be medically necessary in the evaluation of this patient's complaints and with consideration of their physical exam. If needed, translation services were available and utilized.   Patient's history and physical exam concerning for appendicitis versus diverticulitis versus obstruction or other intestinal pathology.  She denies flank pain, dysuria, hematuria, or other urinary symptoms.  No history of stones.  I have lower suspicion for pelvic etiology at this time.  Will obtain CT abdomen pelvis with contrast for further  evaluation.  Laboratory work-up notable for mild leukocytosis 13.3.  Mild hypokalemia 3.3, but no other significant derangement.  Renal and liver function preserved.  Normal lipase.  Pain has been relatively well controlled with Tylenol.  She was given 1 L IV NS.  Blood pressures have been soft, but she and husband state that his normal for her.  CT abdomen pelvis demonstrates acute appendicitis, no complication.  There is also nonspecific lesion on liver, likely benign.  I notified patient and her husband of this incidental finding.  They will follow-up with their primary care provider after surgery and discharge from hospital.  Patient to be started on Rocephin and Flagyl.  We will also give 50 mcg fentanyl for additional pain control.  I spoke with general surgery, Dr. Bobbye Morton, and they will admit patient.  Final Clinical Impression(s) / ED Diagnoses Final diagnoses:  Acute appendicitis with localized peritonitis, without perforation, abscess, or gangrene    Rx / DC Orders ED Discharge Orders    None       Reita Chard 06/18/20 1503    Luna Fuse, MD 06/19/20 [phone]

## 2020-06-18 NOTE — Discharge Instructions (Addendum)
May shower beginning 06/19/2020. Do not peel off or scrub skin glue. May allow warm soapy water to run over incision, then rinse and pat dry. Do not soak in any water (tubs, hot tubs, pools, lakes, oceans) for one week.   No lifting greater than 5 pounds for six weeks.   Pain regimen: take over-the-counter tylenol (acetaminophen) 1000mg  every six hours and the prescription ibuprofen (800mg ) every eight hours. You also have a prescription for oxycodone, which should be taken if the tylenol (acetaminophen) and ibuprofen are not enough to control your pain. You may take the oxycodone as frequently as every six hours as needed. You have also been given a prescription for colace (docusate) which is a stool softener. Please take this as prescribed because the oxycodone can cause constipation and the colace (docusate) will minimize or prevent constipation.  Call the office at [phone] for temperature greater than 101.80F, worsening pain, redness or warmth at the incision site.  Please call [phone] to make an appointment for 1-2 weeks after surgery for wound check.     Please follow-up with your primary care provider about the nonspecific lesion on your liver incidentally found on CT.

## 2020-06-18 NOTE — Anesthesia Preprocedure Evaluation (Addendum)
Anesthesia Evaluation  Patient identified by MRN, date of birth, ID band Patient awake    Reviewed: Allergy & Precautions, H&P , NPO status , Patient's Chart, lab work & pertinent test results  Airway Mallampati: II  TM Distance: >3 FB Neck ROM: Full    Dental no notable dental hx. (+) Edentulous Upper, Edentulous Lower, Dental Advisory Given   Pulmonary asthma ,    Pulmonary exam normal breath sounds clear to auscultation       Cardiovascular negative cardio ROS   Rhythm:Regular Rate:Normal     Neuro/Psych  Headaches, Anxiety    GI/Hepatic Neg liver ROS, GERD  Medicated,  Endo/Other  negative endocrine ROS  Renal/GU negative Renal ROS  negative genitourinary   Musculoskeletal   Abdominal   Peds  Hematology negative hematology ROS (+)   Anesthesia Other Findings   Reproductive/Obstetrics negative OB ROS                           Anesthesia Physical Anesthesia Plan  ASA: II  Anesthesia Plan: General   Post-op Pain Management:    Induction: Intravenous, Rapid sequence and Cricoid pressure planned  PONV Risk Score and Plan: 4 or greater and Ondansetron, Dexamethasone and Midazolam  Airway Management Planned: Oral ETT  Additional Equipment: None  Intra-op Plan:   Post-operative Plan: Extubation in OR  Informed Consent: I have reviewed the patients History and Physical, chart, labs and discussed the procedure including the risks, benefits and alternatives for the proposed anesthesia with the patient or authorized representative who has indicated his/her understanding and acceptance.     Dental advisory given  Plan Discussed with: CRNA, Anesthesiologist and Surgeon  Anesthesia Plan Comments:        Anesthesia Quick Evaluation

## 2020-06-19 ENCOUNTER — Encounter (HOSPITAL_COMMUNITY): Payer: Self-pay | Admitting: Surgery

## 2020-06-22 LAB — SURGICAL PATHOLOGY

## 2020-06-23 ENCOUNTER — Encounter (HOSPITAL_COMMUNITY): Payer: Self-pay | Admitting: Surgery

## 2020-07-02 ENCOUNTER — Telehealth: Payer: Self-pay | Admitting: Gastroenterology

## 2020-07-02 NOTE — Telephone Encounter (Signed)
Patient needs office visit with APP or Dr. Fuller Plan.  Left message for patient to call back

## 2020-07-02 NOTE — Telephone Encounter (Signed)
Patient husband called in to inform that patient had a CT scan at recently at ED department that showed lesion on her liver. He stated that the patient previously had another imaging test that showed another lesion on her liver so they are very concerned. Would like Dr. Fuller Plan to review CT scan and give some advice if can?

## 2020-07-06 NOTE — Telephone Encounter (Signed)
No return calls.  I left another message for the patient's husband to call back to arrange office visit to review abnormal CT findings.

## 2020-07-16 NOTE — Telephone Encounter (Signed)
Patient had abnormal CT that showed liver lesion.  Patient will come in for office visit.  Patient's husband will try and get the imaging studies from 2016 from [facility] that he believes showed the lesion initally.  Patient's husband is offered next available in the beginning of July, but he states he will be in [place].  Patient is scheduled for 7/26 with Dr. Fuller Plan to review.

## 2020-07-20 ENCOUNTER — Other Ambulatory Visit (HOSPITAL_COMMUNITY): Payer: 59

## 2020-07-26 NOTE — Telephone Encounter (Signed)
Patient's husband called today and is requesting that his wife see Dr. Tarri Glenn.  Patient has a liver and they are very concerned.  They saw that Dr. Tarri Glenn has a certification in hepatology and would like to see her and get her opinion.     Dr. Fuller Plan and Tarri Glenn to you approve of the changes?

## 2020-07-26 NOTE — Telephone Encounter (Signed)
Per patient request: OK with me to transfer her GI/Liver care to Dr. Tarri Glenn, if she agrees. Hepatic findings on CT AP performed during 06/18/2020 [facility] ED evaluation. 1.2 cm right hepatic lobe lesions c/w a hemangioma and a 0.8 cm left hepatic lobe lesion not well characterized.

## 2020-07-27 NOTE — Telephone Encounter (Signed)
OK 

## 2020-07-27 NOTE — Telephone Encounter (Signed)
[name], will you please assist patient is setting up appointment with Dr. Tarri Glenn.  Her next available.  Not able to work her in

## 2020-07-27 NOTE — Telephone Encounter (Signed)
Left message in both pt and pt's spouse's vm to call back.

## 2020-08-10 ENCOUNTER — Other Ambulatory Visit (HOSPITAL_COMMUNITY)
Admission: RE | Admit: 2020-08-10 | Discharge: 2020-08-10 | Disposition: A | Discharge status: Home / Self Care | Payer: 59 | Source: Ambulatory Visit | Attending: Pulmonary Disease | Admitting: Pulmonary Disease

## 2020-08-10 DIAGNOSIS — Z01812 Encounter for preprocedural laboratory examination: Secondary | ICD-10-CM | POA: Insufficient documentation

## 2020-08-10 DIAGNOSIS — Z20822 Contact with and (suspected) exposure to covid-19: Secondary | ICD-10-CM | POA: Insufficient documentation

## 2020-08-10 LAB — SARS CORONAVIRUS 2 (TAT 6-24 HRS): SARS Coronavirus 2: NEGATIVE

## 2020-08-13 ENCOUNTER — Other Ambulatory Visit: Payer: Self-pay

## 2020-08-13 ENCOUNTER — Ambulatory Visit (INDEPENDENT_AMBULATORY_CARE_PROVIDER_SITE_OTHER): Payer: 59 | Admitting: Pulmonary Disease

## 2020-08-13 DIAGNOSIS — J4520 Mild intermittent asthma, uncomplicated: Secondary | ICD-10-CM

## 2020-08-13 DIAGNOSIS — D721 Eosinophilia, unspecified: Secondary | ICD-10-CM | POA: Diagnosis not present

## 2020-08-13 LAB — PULMONARY FUNCTION TEST
DL/VA % pred: 98 %
DL/VA: 4.3 ml/min/mmHg/L
DLCO cor % pred: 106 %
DLCO cor: 18.1 ml/min/mmHg
DLCO unc % pred: 106 %
DLCO unc: 18.1 ml/min/mmHg
FEF 25-75 Post: 1.69 L/sec
FEF 25-75 Pre: 1.3 L/sec
FEF2575-%Change-Post: 29 %
FEF2575-%Pred-Post: 81 %
FEF2575-%Pred-Pre: 62 %
FEV1-%Change-Post: 8 %
FEV1-%Pred-Post: 99 %
FEV1-%Pred-Pre: 91 %
FEV1-Post: 2.09 L
FEV1-Pre: 1.92 L
FEV1FVC-%Change-Post: 8 %
FEV1FVC-%Pred-Pre: 88 %
FEV6-%Change-Post: 0 %
FEV6-%Pred-Post: 106 %
FEV6-%Pred-Pre: 106 %
FEV6-Post: 2.78 L
FEV6-Pre: 2.78 L
FEV6FVC-%Pred-Post: 103 %
FEV6FVC-%Pred-Pre: 103 %
FVC-%Change-Post: 0 %
FVC-%Pred-Post: 102 %
FVC-%Pred-Pre: 102 %
FVC-Post: 2.78 L
FVC-Pre: 2.78 L
Post FEV1/FVC ratio: 75 %
Post FEV6/FVC ratio: 100 %
Pre FEV1/FVC ratio: 69 %
Pre FEV6/FVC Ratio: 100 %
RV % pred: 110 %
RV: 1.92 L
TLC % pred: 108 %
TLC: 4.65 L

## 2020-08-13 NOTE — Progress Notes (Signed)
Full PFT performed today. °

## 2020-08-13 NOTE — Patient Instructions (Signed)
Full PFT performed today. °

## 2020-09-10 ENCOUNTER — Encounter: Payer: 59 | Admitting: Internal Medicine

## 2020-09-17 ENCOUNTER — Other Ambulatory Visit (INDEPENDENT_AMBULATORY_CARE_PROVIDER_SITE_OTHER): Payer: 59

## 2020-09-17 ENCOUNTER — Encounter: Payer: Self-pay | Admitting: Gastroenterology

## 2020-09-17 ENCOUNTER — Ambulatory Visit (INDEPENDENT_AMBULATORY_CARE_PROVIDER_SITE_OTHER): Payer: 59 | Admitting: Gastroenterology

## 2020-09-17 VITALS — BP 117/62 | HR 79 | Ht 60.0 in | Wt 119.0 lb

## 2020-09-17 DIAGNOSIS — K8020 Calculus of gallbladder without cholecystitis without obstruction: Secondary | ICD-10-CM

## 2020-09-17 DIAGNOSIS — R932 Abnormal findings on diagnostic imaging of liver and biliary tract: Secondary | ICD-10-CM

## 2020-09-17 DIAGNOSIS — K760 Fatty (change of) liver, not elsewhere classified: Secondary | ICD-10-CM

## 2020-09-17 LAB — CREATININE, SERUM: Creatinine, Ser: 0.53 mg/dL (ref 0.40–1.20)

## 2020-09-17 LAB — BUN: BUN: 11 mg/dL (ref 6–23)

## 2020-09-17 NOTE — Patient Instructions (Addendum)
It was my pleasure to provide care to you today. Based on our discussion, I am providing you with my recommendations below:  RECOMMENDATION(S):   LABS:   Please proceed to the basement level for lab work before leaving today. Press "B" on the elevator. The lab is located at the first door on the left as you exit the elevator.  HEALTHCARE LAWS AND MY CHART RESULTS:   Due to recent changes in healthcare laws, you may see results of your imaging and/or laboratory studies on MyChart before I have had a chance to review them.  I understand that in some cases there may be results that are confusing or concerning to you. Please understand that not all results are received at same time and often I may need to interpret multiple results in order to provide you with the best plan of care or course of treatment. Therefore, I ask that you please give me 48 hours to thoroughly review all your results before contacting my office for clarification.   IMAGING:  You will be contacted by [facility] (Your caller ID will indicate phone # [phone]) within the next business 7-10 business days to schedule your MRI. If you have not heard from them within 7-10 business days, please call [facility] at [phone] to follow up on the status of your appointment.    FOLLOW UP:  I would like for you to follow up with me AFTER you have completed your MRI. Please call the office at [phone] to schedule your appointment.  BMI:  If you are age 60 or younger, your body mass index should be between 19-25. Your Body mass index is 23.24 kg/m. If this is out of the aformentioned range listed, please consider follow up with your Primary Care Provider.   MY CHART:  The [facility] providers would like to encourage you to use [facility] to communicate with providers for non-urgent requests or questions.  Due to long hold times on the telephone, sending your provider a message by  [facility] may be a faster and more efficient way to get a response.  Please allow 48 business hours for a response.  Please remember that this is for non-urgent requests.   Thank you for trusting me with your gastrointestinal care!    Thornton Park, MD, MPH

## 2020-09-17 NOTE — Progress Notes (Signed)
Following sent to April Pait and Rhys Martini:  RADIOLOGY [facility] REQUEST  [facility] Phone: [phone] Fax: [phone]   Imaging Ordered: MRI abd with liver protocol/Eovist  Diagnosis: Abn CT; nonspecific 0.8 by 0.5 cm hypodense lesion in the lateral segment left hepatic lobe  Ordering Provider: Dr. Tarri Glenn  Is a Prior Authorization needed? We are in the process of obtaining it now  Is the patient Diabetic? No  Does the patient have Hypertension? No  Does the patient have any implanted devices or hardware? No  Date of last BUN/Creat, if needed? Ordered today (09/17/20)  Patient Weight? 119#  Is the patient able to get on the table? Yes  Has the patient been diagnosed with COVID? No  Is the patient waiting on COVID testing results? No  Thank you for your assistance! [facility] Team

## 2020-09-17 NOTE — Progress Notes (Addendum)
Referring Provider: Colon Branch, MD Primary Care Physician:  Colon Branch, MD  Reason for Consultation:  Abnormal CT scan of the liver   IMPRESSION:  Abnormal CT of the liver    - MRI with Eovist recommended for further evaluation    - timing is ideal to evaluate for change/growth as it has been 3 months since CT Fatty liver noted on ultrasound but not recent CT scan    - awaiting evaluation of hepatic parenchyma on MRI Persistently normal liver enzymes dating back to 2008 Asymptomatic cholelithiasis Tubular adenoma on colonoscopy 2021    - surveillance colonoscopy recommended 2028 Chronic constipation controlled with prunes and coffee    - discussed bran:applesauce:prune juice as a supplement to avoid medications    - declines prescription therapy Abdominal pain and bloating    - likely related to constipation or constipation-IBS    - given history, consider EGD to screen for H pylori, gastritis, esophagitis, malignancy Mild scattered diverticulosis    - high fiber diet, increase daily water intake  Recent appendectomy  Her husband had a list of questions. I tried to answer them to the best of my ability.     PLAN: - BMP in preparation for MRI - MRI with Eovist to evaluate abnormal liver lesions on CT - Office follow-up after the MRI to review the results - Colonoscopy in 2028 - Husband requested EGD. I suggested that we address her long-standing GI symptoms and indications for possible EGD after evaluating abnormal liver CT   Please see the "Patient Instructions" section for addition details about the plan.  HPI: [name] is a 60 y.o. female former patient of Dr. Lynne Leader who requested office consult with me to review an abnormal CT scan. She has previously seen Dr. Fuller Plan for chronic constipation, periumbilical abdominal pain, bloating, and reflux. Lifetime history of constipation controlled with prunes and coffee. Her most recent office in the office 02/05/20 showed  symptom management for constipation-IBS versus chronic idiopathic constipation. Her husband accompanies her to this appointment and contributes to the history. He brings a list of questions.    Seen in ED 06/18/20 for lower abdominal pain. CMP, lipase, and CBC were normal. I have personally reviewed the images from the CT abd/pelvis with contrast showed acute appendicitis as well as nonspecific 0.8 by 0.5 cm hypodense lesion in the lateral segment left hepatic lobe on image 19 series 3, not readily apparent on 03/30/2014. 1.2 by 0.9 cm densely enhancing lesion in the right hepatic lobe on image 16 of series 3, this was present on 03/30/2014 with appearance favoring hemangioma. Several gallstones are present in the gallbladder measuring up to 2.3 cm in long axis.  Has otherwise not been feeling well since her appendectomy 06/18/20 with ongoing fatigue, left abdominal discomfort, and lower back pain.   No prior history of liver disease. No prior history of elevated liver enzymes. Prior blood donation in 31s. No prior blood tranfusion. No history of jaundice. Fatty liver seen on ultrasound on prior ultrasound.   She does not like to take medications. Antispasmodics previously prescribed by Dr. Fuller Plan have not been used. Has tried in increase dietary fiber and water intake without any change in chronic constipation.  Colonoscopy with Dr. Fuller Plan 08/31/2009 and 12/12/2019. Cecal tubular adenoma removed 2021. Surveillance colonoscopy was recommended in 7 years. She has some ongoing anxiety about waiting 7 years. She also wonders if she needs an EGD to evaluate her other GI symptoms.   Epic  shows that her liver enzymes have always been normal dating back to 2008.  Platelets are normal.      Past Medical History:  Diagnosis Date   Adenomatous colon polyp    Allergy    Asthma 1990's   GERD (gastroesophageal reflux disease)    Headache(784.0)    Palpitations    Pneumonia    Tuberculosis 1990's    Past  Surgical History:  Procedure Laterality Date   APPENDECTOMY     COLONOSCOPY  08/31/2009   COLONOSCOPY  12/12/2019   COLPOSCOPY  04/28/2016   LAPAROSCOPIC APPENDECTOMY N/A 06/18/2020   Procedure: APPENDECTOMY LAPAROSCOPIC;  Surgeon: Jesusita Oka, MD;  Location: [facility];  Service: General;  Laterality: N/A;   OVARIAN CYST REMOVAL      Current Outpatient Medications  Medication Sig Dispense Refill   calcium-vitamin D 250-100 MG-UNIT tablet Take 1 tablet by mouth in the morning, at noon, and at bedtime.     dicyclomine (BENTYL) 10 MG capsule Take 1 capsule (10 mg total) by mouth 3 (three) times daily as needed for spasms. 90 capsule 11   famotidine (PEPCID) 20 MG tablet Take 1 tablet (20 mg total) by mouth daily. 30 tablet 11   ibandronate (BONIVA) 150 MG tablet Take 1 tablet (150 mg total) by mouth every 30 (thirty) days. 3 tablet 3   ibuprofen (ADVIL) 800 MG tablet Take 800 mg by mouth every 8 (eight) hours as needed. As needed.     No current facility-administered medications for this visit.    Allergies as of 09/17/2020 - Review Complete 09/17/2020  Allergen Reaction Noted   Clindamycin Diarrhea 09/12/2018    Family History  Problem Relation Age of Onset   Hypertension Mother    High Cholesterol Mother    Diabetes Paternal Grandfather    Atrial fibrillation Father        pacemaker   Other Father        blood clotting disorder   Hyperlipidemia Brother    Heart disease Other    Hypertension Other    Hyperlipidemia Other    Hypertension Other    Colon cancer Neg Hx    Breast cancer Neg Hx    Colon polyps Neg Hx       Physical Exam: General:   Alert, in NAD. No scleral icterus. No bilateral temporal wasting.  Heart:  Regular rate and rhythm; no murmurs Pulm: Clear anteriorly; no wheezing Abdomen:  Soft. Nontender. Nondistended. Normal bowel sounds. No rebound or guarding. No fluid wave.  LAD: No inguinal or umbilical LAD Extremities:  Without edema. Neurologic:   Alert and  oriented x4;  grossly normal neurologically; no asterixis or clonus. Skin: No jaundice. No palmar erythema or spider angioma.  Psych:  Alert and cooperative. Normal mood and affect.       Syanna Remmert L. Tarri Glenn, MD, MPH 09/17/2020, 6:06 PM

## 2020-09-24 ENCOUNTER — Ambulatory Visit (HOSPITAL_COMMUNITY)
Admission: RE | Admit: 2020-09-24 | Discharge: 2020-09-24 | Disposition: A | Discharge status: Home / Self Care | Payer: 59 | Source: Ambulatory Visit | Attending: Gastroenterology | Admitting: Gastroenterology

## 2020-09-24 ENCOUNTER — Other Ambulatory Visit: Payer: Self-pay

## 2020-09-24 DIAGNOSIS — R932 Abnormal findings on diagnostic imaging of liver and biliary tract: Secondary | ICD-10-CM | POA: Diagnosis not present

## 2020-09-24 DIAGNOSIS — K760 Fatty (change of) liver, not elsewhere classified: Secondary | ICD-10-CM | POA: Diagnosis present

## 2020-09-24 DIAGNOSIS — K8020 Calculus of gallbladder without cholecystitis without obstruction: Secondary | ICD-10-CM

## 2020-09-24 MED ORDER — GADOBUTROL 1 MMOL/ML IV SOLN
5.0000 mL | Freq: Once | INTRAVENOUS | Status: AC | PRN
  Administered 2020-09-24: 5 mL via INTRAVENOUS

## 2020-09-28 ENCOUNTER — Ambulatory Visit: Payer: 59 | Admitting: Gastroenterology

## 2020-10-01 ENCOUNTER — Encounter: Payer: 59 | Admitting: Internal Medicine

## 2020-10-06 ENCOUNTER — Other Ambulatory Visit: Payer: Self-pay

## 2020-10-06 ENCOUNTER — Encounter: Payer: Self-pay | Admitting: Internal Medicine

## 2020-10-06 ENCOUNTER — Ambulatory Visit (INDEPENDENT_AMBULATORY_CARE_PROVIDER_SITE_OTHER): Payer: 59 | Admitting: Internal Medicine

## 2020-10-06 VITALS — BP 108/64 | HR 65 | Temp 98.5°F | Resp 16 | Ht 60.0 in | Wt 119.0 lb

## 2020-10-06 DIAGNOSIS — D649 Anemia, unspecified: Secondary | ICD-10-CM | POA: Diagnosis not present

## 2020-10-06 DIAGNOSIS — M810 Age-related osteoporosis without current pathological fracture: Secondary | ICD-10-CM | POA: Diagnosis not present

## 2020-10-06 DIAGNOSIS — Z0000 Encounter for general adult medical examination without abnormal findings: Secondary | ICD-10-CM | POA: Diagnosis not present

## 2020-10-06 DIAGNOSIS — E559 Vitamin D deficiency, unspecified: Secondary | ICD-10-CM | POA: Diagnosis not present

## 2020-10-06 MED ORDER — CIPROFLOXACIN HCL 500 MG PO TABS
500.0000 mg | ORAL_TABLET | Freq: Two times a day (BID) | ORAL | 0 refills | Status: DC
Start: 2020-10-06 — End: 2020-11-03

## 2020-10-06 NOTE — Progress Notes (Signed)
Subjective:    Patient ID: [name], female    DOB: February 01, 1961, 60 y.o.   MRN: [id number]  DOS:  10/06/2020 Type of visit - description: CPX  Since the last office visit, had an appendectomy, she feels fully recuperated. In the process, as CT of the abdomen was abnormal, subsequently saw GI and a MRI of the liver was done. History of asthma, currently asymptomatic and not taking any medications.  Review of Systems     A 14 point review of systems is negative    Past Medical History:  Diagnosis Date   Adenomatous colon polyp    Allergy    Asthma 1990's   GERD (gastroesophageal reflux disease)    Headache(784.0)    Palpitations    Pneumonia    Tuberculosis 1990's    Past Surgical History:  Procedure Laterality Date   APPENDECTOMY     COLONOSCOPY  08/31/2009   COLONOSCOPY  12/12/2019   COLPOSCOPY  04/28/2016   LAPAROSCOPIC APPENDECTOMY N/A 06/18/2020   Procedure: APPENDECTOMY LAPAROSCOPIC;  Surgeon: Jesusita Oka, MD;  Location: [facility];  Service: General;  Laterality: N/A;   OVARIAN CYST REMOVAL     Social History   Socioeconomic History   Marital status: Married    Spouse name: Not on file   Number of children: 0   Years of education: Not on file   Highest education level: Not on file  Occupational History   Occupation: Scientist, water quality [employer]  Tobacco Use   Smoking status: Never   Smokeless tobacco: Never  Vaping Use   Vaping Use: Never used  Substance and Sexual Activity   Alcohol use: Yes    Comment: occasional wine   Drug use: No   Sexual activity: Not on file  Other Topics Concern   Not on file  Social History Narrative   From [place] Determinants of Health   Financial Resource Strain: Not on file  Food Insecurity: Not on file  Transportation Needs: Not on file  Physical Activity: Not on file  Stress: Not on file  Social Connections: Not on file  Intimate Partner Violence: Not on file    Allergies as of 10/06/2020        Reactions   Clindamycin Diarrhea        Medication List        Accurate as of October 06, 2020 11:59 PM. If you have any questions, ask your nurse or doctor.          STOP taking these medications    dicyclomine 10 MG capsule Commonly known as: BENTYL Stopped by: Kathlene November, MD       TAKE these medications    calcium-vitamin D 250-100 MG-UNIT tablet Take 1 tablet by mouth in the morning, at noon, and at bedtime.   ciprofloxacin 500 MG tablet Commonly known as: CIPRO Take 1 tablet (500 mg total) by mouth 2 (two) times daily. Started by: Kathlene November, MD   famotidine 20 MG tablet Commonly known as: PEPCID Take 1 tablet (20 mg total) by mouth daily.   ibandronate 150 MG tablet Commonly known as: BONIVA Take 1 tablet (150 mg total) by mouth every 30 (thirty) days.   ibuprofen 800 MG tablet Commonly known as: ADVIL Take 800 mg by mouth every 8 (eight) hours as needed. As needed.           Objective:   Physical Exam BP 108/64 (BP Location: Left Arm, Patient Position:  Sitting, Cuff Size: Small)   Pulse 65   Temp 98.5 F (36.9 C) (Oral)   Resp 16   Ht 5' (1.524 m)   Wt 119 lb (54 kg)   SpO2 98%   BMI 23.24 kg/m  General: Well developed, NAD, BMI noted Neck: No  thyromegaly  HEENT:  Normocephalic . Face symmetric, atraumatic Lungs:  CTA B Normal respiratory effort, no intercostal retractions, no accessory muscle use. Heart: RRR,  no murmur.  Abdomen:  Not distended, soft, non-tender. No rebound or rigidity.   Lower extremities: no pretibial edema bilaterally  Skin: Exposed areas without rash. Not pale. Not jaundice Neurologic:  alert & oriented X3.  Speech normal, gait appropriate for age and unassisted Strength symmetric and appropriate for age.  Psych: Cognition and judgment appear intact.  Cooperative with normal attention span and concentration.  Behavior appropriate. No anxious or depressed appearing.     Assessment    ASSESSMENT  (new  patient, 02/2016) Chest pain, dizziness: Normal ETT echo and event monitor 2011. ETT again 04-2014: Normal, good exercise capacity Hematuria: per patient previous urology w/u (-). Dr McDermoth Colon polyps  Menopause since ~ age 39 H/o hair  loss, saw dermatology, biopsy done. GERD-Dyspepsia- cholelithiasis  : rx bentyl Osteoporosis: T score -2.9 07/2017 Vit D def 2014 History of tuberculosis in her 23s. Asthma, eosinophilia: Seen by pulmonology 12-2019  PLAN: Here for CPX Osteoporosis: Seen by [facility] 2021, was recommended Boniva, patient strongly request a bone density test, will do.  Plans to see the doctors at [facility] soon. Vitamin D deficiency: On OTCs, checking labs GERD, dyspepsia, cholelithiasis: Asymptomatic, on Pepcid as needed only. Asthma, eosinophilia: Seen by pulmonary recently, asymptomatic. Abnormal CT liver: Follow-up by gastroenterology, MRI of the liver was benign. Abdominal aortic atherosclerosis: Per CT 06/2020.  Plan is to control CV RF. Traveler's diarrhea also requests ciprofloxacin for traveler's diarrhea, he is going to [place] in few weeks.  Prescription printed RTC 1 year   This visit occurred during the SARS-CoV-2 public health emergency.  Safety protocols were in place, including screening questions prior to the visit, additional usage of staff PPE, and extensive cleaning of exam room while observing appropriate contact time as indicated for disinfecting solutions.

## 2020-10-06 NOTE — Patient Instructions (Addendum)
Vaccines are recommended:  Shingrix, not available today, you can get it at your pharmacy  COVID-vaccine #4  Flu shot every fall  We are ordering a bone density test to be done in [facility]  If you develop traveler's diarrhea follow-up improved: Drink plenty of fluids, take Pepto-Bismol.  If your symptoms are moderate you can take ciprofloxacin x3 days.  If you have severe symptoms: See a local MD.   GO TO THE LAB : Get the blood work     [place], [place] back for   a physical exam in 1 year  "Living will", "[place] of attorney": Advanced care planning  (If you already have a living will or healthcare power of attorney, please bring the copy to be scanned in your chart.)  Advance care planning is a process that supports adults in  understanding and sharing their preferences regarding future medical care.   The patient's preferences are recorded in documents called Advance Directives.    Advanced directives are completed (and can be modified at any time) while the patient is in full mental capacity.   The documentation should be available at all times to the patient, the family and the healthcare providers.  Bring in a copy to be scanned in your chart is an excellent idea and is recommended   This legal documents direct treatment decision making and/or appoint a surrogate to make the decision if the patient is not capable to do so.    Advance directives can be documented in many types of formats,  documents have names such as:  Lliving will  Durable power of attorney for healthcare (healthcare proxy or healthcare power of attorney)  Combined directives  Physician orders for life-sustaining treatment    More information at:  [email or web address]

## 2020-10-07 ENCOUNTER — Encounter: Payer: Self-pay | Admitting: Internal Medicine

## 2020-10-07 LAB — CBC WITH DIFFERENTIAL/PLATELET
Basophils Absolute: 0.1 10*3/uL (ref 0.0–0.1)
Basophils Relative: 1 % (ref 0.0–3.0)
Eosinophils Absolute: 0.4 10*3/uL (ref 0.0–0.7)
Eosinophils Relative: 6.6 % — ABNORMAL HIGH (ref 0.0–5.0)
HCT: 36.1 % (ref 36.0–46.0)
Hemoglobin: 11.9 g/dL — ABNORMAL LOW (ref 12.0–15.0)
Lymphocytes Relative: 42.9 % (ref 12.0–46.0)
Lymphs Abs: 2.3 10*3/uL (ref 0.7–4.0)
MCHC: 32.9 g/dL (ref 30.0–36.0)
MCV: 91 fl (ref 78.0–100.0)
Monocytes Absolute: 0.3 10*3/uL (ref 0.1–1.0)
Monocytes Relative: 5.6 % (ref 3.0–12.0)
Neutro Abs: 2.4 10*3/uL (ref 1.4–7.7)
Neutrophils Relative %: 43.9 % (ref 43.0–77.0)
Platelets: 223 10*3/uL (ref 150.0–400.0)
RBC: 3.97 Mil/uL (ref 3.87–5.11)
RDW: 14 % (ref 11.5–15.5)
WBC: 5.4 10*3/uL (ref 4.0–10.5)

## 2020-10-07 LAB — VITAMIN D 25 HYDROXY (VIT D DEFICIENCY, FRACTURES): VITD: 33.71 ng/mL (ref 30.00–100.00)

## 2020-10-07 LAB — COMPREHENSIVE METABOLIC PANEL
ALT: 14 U/L (ref 0–35)
AST: 15 U/L (ref 0–37)
Albumin: 4 g/dL (ref 3.5–5.2)
Alkaline Phosphatase: 56 U/L (ref 39–117)
BUN: 10 mg/dL (ref 6–23)
CO2: 27 mEq/L (ref 19–32)
Calcium: 9 mg/dL (ref 8.4–10.5)
Chloride: 104 mEq/L (ref 96–112)
Creatinine, Ser: 0.55 mg/dL (ref 0.40–1.20)
GFR: 99.56 mL/min (ref >60.00)
Glucose, Bld: 86 mg/dL (ref 70–99)
Potassium: 4 mEq/L (ref 3.5–5.1)
Sodium: 138 mEq/L (ref 135–145)
Total Bilirubin: 0.6 mg/dL (ref 0.2–1.2)
Total Protein: 6.7 g/dL (ref 6.0–8.3)

## 2020-10-07 LAB — B12 AND FOLATE PANEL
Folate: 19.2 ng/mL (ref >5.9)
Vitamin B-12: 174 pg/mL — ABNORMAL LOW (ref 211–911)

## 2020-10-07 LAB — LIPID PANEL
Cholesterol: 239 mg/dL — ABNORMAL HIGH (ref 0–200)
HDL: 67.2 mg/dL (ref >39.00)
LDL Cholesterol: 139 mg/dL — ABNORMAL HIGH (ref 0–99)
NonHDL: 171.63
Total CHOL/HDL Ratio: 4
Triglycerides: 164 mg/dL — ABNORMAL HIGH (ref 0.0–149.0)
VLDL: 32.8 mg/dL (ref 0.0–40.0)

## 2020-10-07 LAB — FERRITIN: Ferritin: 9.9 ng/mL — ABNORMAL LOW (ref 10.0–291.0)

## 2020-10-07 LAB — IRON: Iron: 106 ug/dL (ref 42–145)

## 2020-10-07 NOTE — Assessment & Plan Note (Signed)
-  Td: 2018 -Had Covid shot x3, booster recommended -Shingrix discussed but not available today. -Recommend flu shot yearly - Female care:  per gynecology:  PAP 09/2017 per [place], [place] - [facility]- Cscope 2011, Dr Deatra Ina, [place] [phone], next per GI.   - Labs: CMP, CBC, FLP, iron, ferritin, B12, vitamin D -POA: Discussed

## 2020-10-07 NOTE — Assessment & Plan Note (Signed)
Here for CPX Osteoporosis: Seen by [facility] 2021, was recommended Boniva, patient strongly request a bone density test, will do.  Plans to see the doctors at [facility] soon. Vitamin D deficiency: On OTCs, checking labs GERD, dyspepsia, cholelithiasis: Asymptomatic, on Pepcid as needed only. Asthma, eosinophilia: Seen by pulmonary recently, asymptomatic. Abnormal CT liver: Follow-up by gastroenterology, MRI of the liver was benign. Abdominal aortic atherosclerosis: Per CT 06/2020.  Plan is to control CV RF. Traveler's diarrhea also requests ciprofloxacin for traveler's diarrhea, he is going to [place] in few weeks.  Prescription printed RTC 1 year

## 2020-10-11 ENCOUNTER — Ambulatory Visit: Payer: 59 | Admitting: Gastroenterology

## 2020-10-13 ENCOUNTER — Encounter: Payer: Self-pay | Admitting: Internal Medicine

## 2020-10-13 NOTE — Addendum Note (Signed)
Addended byDamita Dunnings D on: 10/13/2020 01:24 PM   Modules accepted: Orders

## 2020-10-14 NOTE — Telephone Encounter (Signed)
Spoke with patient's Spouse, Jori Moll this morning and patient would like to get the stool test sooner than getting it via [employer]PS.  She will come pick it up today.  I got it from the outgoing mail and placed it in the alphabetical file for in office pick up for the patient.

## 2020-10-15 ENCOUNTER — Ambulatory Visit: Payer: 59 | Admitting: Gastroenterology

## 2020-10-15 ENCOUNTER — Other Ambulatory Visit (INDEPENDENT_AMBULATORY_CARE_PROVIDER_SITE_OTHER): Payer: 59

## 2020-10-15 DIAGNOSIS — D649 Anemia, unspecified: Secondary | ICD-10-CM | POA: Diagnosis not present

## 2020-10-15 LAB — FECAL OCCULT BLOOD, IMMUNOCHEMICAL: Fecal Occult Bld: NEGATIVE

## 2020-10-17 NOTE — Progress Notes (Signed)
   Subjective:    Patient ID: [name], female    DOB: [birth year], 60 y.o.   MRN: [id number]  DOS:  10/15/2020 Type of visit - description:     Review of Systems See above   Past Medical History:  Diagnosis Date   Adenomatous colon polyp    Allergy    Asthma 1990's   GERD (gastroesophageal reflux disease)    Headache(784.0)    Palpitations    Pneumonia    Tuberculosis 1990's    Past Surgical History:  Procedure Laterality Date   APPENDECTOMY     COLONOSCOPY  08/31/2009   COLONOSCOPY  12/12/2019   COLPOSCOPY  04/28/2016   LAPAROSCOPIC APPENDECTOMY N/A 06/18/2020   Procedure: APPENDECTOMY LAPAROSCOPIC;  Surgeon: Jesusita Oka, MD;  Location: [place];  Service: General;  Laterality: N/A;   OVARIAN CYST REMOVAL      Allergies as of 10/15/2020       Reactions   Clindamycin Diarrhea        Medication List        Accurate as of October 15, 2020 11:59 PM. If you have any questions, ask your nurse or doctor.          calcium-vitamin D 250-100 MG-UNIT tablet Take 1 tablet by mouth in the morning, at noon, and at bedtime.   ciprofloxacin 500 MG tablet Commonly known as: CIPRO Take 1 tablet (500 mg total) by mouth 2 (two) times daily.   famotidine 20 MG tablet Commonly known as: PEPCID Take 1 tablet (20 mg total) by mouth daily.   ibandronate 150 MG tablet Commonly known as: BONIVA Take 1 tablet (150 mg total) by mouth every 30 (thirty) days.   ibuprofen 800 MG tablet Commonly known as: ADVIL Take 800 mg by mouth every 8 (eight) hours as needed. As needed.           Objective:   Physical Exam There were no vitals taken for this visit.     Assessment        This visit occurred during the SARS-CoV-2 public health emergency.  Safety protocols were in place, including screening questions prior to the visit, additional usage of staff PPE, and extensive cleaning of exam room while observing appropriate contact time as indicated for disinfecting  solutions.

## 2020-10-18 MED ORDER — FERROUS FUMARATE 325 (106 FE) MG PO TABS: 1.0000 | ORAL_TABLET | Freq: Two times a day (BID) | ORAL | 5 refills | Status: DC

## 2020-11-03 ENCOUNTER — Ambulatory Visit (INDEPENDENT_AMBULATORY_CARE_PROVIDER_SITE_OTHER): Payer: 59 | Admitting: Gastroenterology

## 2020-11-03 ENCOUNTER — Encounter: Payer: Self-pay | Admitting: Gastroenterology

## 2020-11-03 VITALS — BP 98/58 | HR 66 | Ht 60.0 in | Wt 117.0 lb

## 2020-11-03 DIAGNOSIS — K219 Gastro-esophageal reflux disease without esophagitis: Secondary | ICD-10-CM

## 2020-11-03 DIAGNOSIS — K7689 Other specified diseases of liver: Secondary | ICD-10-CM

## 2020-11-03 DIAGNOSIS — G8929 Other chronic pain: Secondary | ICD-10-CM

## 2020-11-03 DIAGNOSIS — D1803 Hemangioma of intra-abdominal structures: Secondary | ICD-10-CM

## 2020-11-03 DIAGNOSIS — R1013 Epigastric pain: Secondary | ICD-10-CM

## 2020-11-03 NOTE — Progress Notes (Signed)
Referring Provider: Colon Branch, MD Primary Care Physician:  Colon Branch, MD  Chief complaint:  Abnormal CT scan of the liver   IMPRESSION:  Reflux controlled with famotidine Epigastric abdominal pain and bloating    - likely related to constipation or constipation-IBS    - given history, consider EGD to screen for H pylori, gastritis, esophagitis, malignancy 11 mm right hepatic lobe hemangioma 8.5 mm left hepatic lobe cyst Fatty liver noted on ultrasound but not recent CT scan or MRI Persistently normal liver enzymes dating back to 2008 Asymptomatic cholelithiasis Tubular adenoma on colonoscopy 2021    - surveillance colonoscopy recommended 2028 Chronic constipation controlled with prunes and coffee    - discussed bran:applesauce:prune juice as a supplement to avoid medications    - declines prescription therapy Mild scattered diverticulosis    - high fiber diet, increase daily water intake  Recent appendectomy with mild pain at the surgical site  Reviewed MRI findings. Reassurance provided. No specific follow-up indicated at this time.   MRI does not explain ongoing GI symptoms. Recommend EGD to screen for H pylori, gastritis, esophagitis, malignancy  We spent a considerable amount of time answering questions.     PLAN: - Famotidine 20 mg QD-BID - EGD at her convenience - Colonoscopy in 2028   Please see the "Patient Instructions" section for addition details about the plan.  HPI: [name] is a 60 y.o. female who returns in follow-up after having an abnormal CT scan. She has previously seen Dr. Fuller Plan for chronic constipation, periumbilical abdominal pain, bloating, and reflux. Lifetime history of constipation controlled with prunes and coffee. Her most recent office in the office 02/05/20 showed symptom management for constipation-IBS versus chronic idiopathic constipation. Her husband accompanies her to this appointment and brings a list of questions.    Seen  in ED 06/18/20 for lower abdominal pain. CMP, lipase, and CBC were normal. I have personally reviewed the images from the CT abd/pelvis with contrast showed acute appendicitis as well as nonspecific 0.8 by 0.5 cm hypodense lesion in the lateral segment left hepatic lobe on image 19 series 3, not readily apparent on 03/30/2014. 1.2 by 0.9 cm densely enhancing lesion in the right hepatic lobe on image 16 of series 3, this was present on 03/30/2014 with appearance favoring hemangioma. Several gallstones are present in the gallbladder measuring up to 2.3 cm in long axis.  Has otherwise not been feeling well since her appendectomy 06/18/20 with ongoing fatigue, left abdominal discomfort, and lower back pain.  Liver enzymes have been normal back to 2008. Platelets are normal.   Follow-up MRI with and without contrast 09/24/2020 showed a 11 mm right hepatic lobe hemangioma and an 8.5 mm left hepatic lobe benign cyst.  Cholelithiasis present.  Has ongoing GI symptoms that she would like to address. Notes recent reflux when she does not take famotidine. No evidence for GI bleeding, iron deficiency anemia, anorexia, unexplained weight loss, dysphagia, odynophagia, persistent vomiting, or gastrointestinal cancer in a first-degree relative.   She does not like to take medications. Antispasmodics previously prescribed by Dr. Fuller Plan have not been used. Has tried in increase dietary fiber and water intake without any change in chronic constipation.  Colonoscopy with Dr. Fuller Plan 08/31/2009 and 12/12/2019. Cecal tubular adenoma removed 2021. Surveillance colonoscopy was recommended in 7 years. She has some ongoing anxiety about waiting 7 years. She also wonders if she needs an EGD to evaluate her other GI symptoms.  Past Medical History:  Diagnosis Date   Adenomatous colon polyp    Allergy    Asthma 1990's   GERD (gastroesophageal reflux disease)    Headache(784.0)    Palpitations    Pneumonia    Tuberculosis  1990's    Past Surgical History:  Procedure Laterality Date   APPENDECTOMY     COLONOSCOPY  08/31/2009   COLONOSCOPY  12/12/2019   COLPOSCOPY  04/28/2016   LAPAROSCOPIC APPENDECTOMY N/A 06/18/2020   Procedure: APPENDECTOMY LAPAROSCOPIC;  Surgeon: Jesusita Oka, MD;  Location: [facility];  Service: General;  Laterality: N/A;   OVARIAN CYST REMOVAL      Current Outpatient Medications  Medication Sig Dispense Refill   calcium-vitamin D 250-100 MG-UNIT tablet Take 1 tablet by mouth in the morning, at noon, and at bedtime.     famotidine (PEPCID) 20 MG tablet Take 1 tablet (20 mg total) by mouth daily. 30 tablet 11   ferrous fumarate (HEMOCYTE - 106 MG FE) 325 (106 Fe) MG TABS tablet Take 1 tablet (106 mg of iron total) by mouth 2 (two) times daily. 60 tablet 5   ibandronate (BONIVA) 150 MG tablet Take 1 tablet (150 mg total) by mouth every 30 (thirty) days. 3 tablet 3   No current facility-administered medications for this visit.    Allergies as of 11/03/2020 - Review Complete 10/07/2020  Allergen Reaction Noted   Clindamycin Diarrhea 09/12/2018    Family History  Problem Relation Age of Onset   Hypertension Mother    High Cholesterol Mother    Diabetes Paternal Grandfather    Atrial fibrillation Father        pacemaker   Other Father        blood clotting disorder   Hyperlipidemia Brother    Heart disease Other    Hypertension Other    Hyperlipidemia Other    Hypertension Other    Colon cancer Neg Hx    Breast cancer Neg Hx    Colon polyps Neg Hx       Physical Exam: General:   Alert, in NAD. No scleral icterus. No bilateral temporal wasting.  Heart:  Regular rate and rhythm; no murmurs Pulm: Clear anteriorly; no wheezing Abdomen:  Soft. Nontender. Nondistended. Normal bowel sounds. No rebound or guarding. No fluid wave.  LAD: No inguinal or umbilical LAD Extremities:  Without edema. Neurologic:  Alert and  oriented x4;  grossly normal neurologically; no asterixis  or clonus. Skin: No jaundice. No palmar erythema or spider angioma.  Psych:  Alert and cooperative. Normal mood and affect.       Jolita Haefner L. Tarri Glenn, MD, MPH 11/03/2020, 3:10 PM

## 2020-11-03 NOTE — Patient Instructions (Addendum)
It was my pleasure to provide care to you today. Based on our discussion, I am providing you with my recommendations below:  RECOMMENDATION(S):   ENDOSCOPY:   You have been scheduled for an endoscopy. Please follow written instructions given to you at your visit today.  INHALERS:   If you use inhalers (even only as needed), please bring them with you on the day of your procedure.  FOLLOW UP:  After your procedure, you will receive a call from my office staff regarding my recommendation for follow up.  BMI:  If you are age 60 or younger, your body mass index should be between 19-25. Your There is no height or weight on file to calculate BMI. If this is out of the aformentioned range listed, please consider follow up with your Primary Care Provider.   MY CHART:  The [facility] providers would like to encourage you to use [facility] to communicate with providers for non-urgent requests or questions.  Due to long hold times on the telephone, sending your provider a message by [facility] may be a faster and more efficient way to get a response.  Please allow 48 business hours for a response.  Please remember that this is for non-urgent requests.   Thank you for trusting me with your gastrointestinal care!    Thornton Park, MD, MPH

## 2020-11-05 ENCOUNTER — Encounter: Payer: 59 | Admitting: Internal Medicine

## 2020-11-14 ENCOUNTER — Encounter: Payer: Self-pay | Admitting: Certified Registered Nurse Anesthetist

## 2020-11-15 ENCOUNTER — Other Ambulatory Visit: Payer: Self-pay

## 2020-11-15 ENCOUNTER — Ambulatory Visit (AMBULATORY_SURGERY_CENTER): Payer: 59 | Admitting: Gastroenterology

## 2020-11-15 ENCOUNTER — Encounter: Payer: Self-pay | Admitting: Gastroenterology

## 2020-11-15 VITALS — BP 115/74 | HR 73 | Temp 97.5°F | Resp 12 | Ht 60.0 in | Wt 117.0 lb

## 2020-11-15 DIAGNOSIS — D590 Drug-induced autoimmune hemolytic anemia: Secondary | ICD-10-CM

## 2020-11-15 DIAGNOSIS — K200 Eosinophilic esophagitis: Secondary | ICD-10-CM | POA: Diagnosis not present

## 2020-11-15 DIAGNOSIS — K319 Disease of stomach and duodenum, unspecified: Secondary | ICD-10-CM | POA: Diagnosis not present

## 2020-11-15 DIAGNOSIS — K3189 Other diseases of stomach and duodenum: Secondary | ICD-10-CM

## 2020-11-15 DIAGNOSIS — K219 Gastro-esophageal reflux disease without esophagitis: Secondary | ICD-10-CM

## 2020-11-15 MED ORDER — SODIUM CHLORIDE 0.9 % IV SOLN: 500.0000 mL | Freq: Once | INTRAVENOUS | Status: DC

## 2020-11-15 MED ORDER — PANTOPRAZOLE SODIUM 40 MG PO TBEC
40.0000 mg | DELAYED_RELEASE_TABLET | Freq: Two times a day (BID) | ORAL | 2 refills | Status: DC
Start: 2020-11-15 — End: 2020-12-31

## 2020-11-15 NOTE — Patient Instructions (Signed)
Please read handouts provided. Continue present medications, including Pepcid 20 mg twice daily. Begin Pantoprazole 40 mg twice daily for 8 weeks. No aspirin,ibuprofen, naproxen, or other non-steriodal anti-inflammatory drugs. Office follow-up in 4-6 weeks, earlier if needed. Await pathology results.    YOU HAD AN ENDOSCOPIC PROCEDURE TODAY AT [facility]:   Refer to the procedure report that was given to you for any specific questions about what was found during the examination.  If the procedure report does not answer your questions, please call your gastroenterologist to clarify.  If you requested that your care partner not be given the details of your procedure findings, then the procedure report has been included in a sealed envelope for you to review at your convenience later.  YOU SHOULD EXPECT: Some feelings of bloating in the abdomen. Passage of more gas than usual.  Walking can help get rid of the air that was put into your GI tract during the procedure and reduce the bloating. If you had a lower endoscopy (such as a colonoscopy or flexible sigmoidoscopy) you may notice spotting of blood in your stool or on the toilet paper. If you underwent a bowel prep for your procedure, you may not have a normal bowel movement for a few days.  Please Note:  You might notice some irritation and congestion in your nose or some drainage.  This is from the oxygen used during your procedure.  There is no need for concern and it should clear up in a day or so.  SYMPTOMS TO REPORT IMMEDIATELY:   Following upper endoscopy (EGD)  Vomiting of blood or coffee ground material  New chest pain or pain under the shoulder blades  Painful or persistently difficult swallowing  New shortness of breath  Fever of 100F or higher  Black, tarry-looking stools  For urgent or emergent issues, a gastroenterologist can be reached at any hour by calling [phone]. Do not use MyChart messaging for  urgent concerns.    DIET:  We do recommend a small meal at first, but then you may proceed to your regular diet.  Drink plenty of fluids but you should avoid alcoholic beverages for 24 hours.  ACTIVITY:  You should plan to take it easy for the rest of today and you should NOT DRIVE or use heavy machinery until tomorrow (because of the sedation medicines used during the test).    FOLLOW UP: Our staff will call the number listed on your records 48-72 hours following your procedure to check on you and address any questions or concerns that you may have regarding the information given to you following your procedure. If we do not reach you, we will leave a message.  We will attempt to reach you two times.  During this call, we will ask if you have developed any symptoms of COVID 19. If you develop any symptoms (ie: fever, flu-like symptoms, shortness of breath, cough etc.) before then, please call [phone].  If you test positive for Covid 19 in the 2 weeks post procedure, please call and report this information to [place].    If any biopsies were taken you will be contacted by phone or by letter within the next 1-3 weeks.  Please call us at [phone] if you have not heard about the biopsies in 3 weeks.    SIGNATURES/CONFIDENTIALITY: You and/or your care partner have signed paperwork which will be entered into your electronic medical record.  These signatures attest to the fact that that  the information above on your After Visit Summary has been reviewed and is understood.  Full responsibility of the confidentiality of this discharge information lies with you and/or your care-partner.

## 2020-11-15 NOTE — Progress Notes (Signed)
1358 Robinul 0.1 mg IV given due large amount of secretions upon assessment.  MD made aware, vss

## 2020-11-15 NOTE — Progress Notes (Signed)
Called to room to assist during endoscopic procedure.  Patient ID and intended procedure confirmed with present staff. Received instructions for my participation in the procedure from the performing physician.  

## 2020-11-15 NOTE — Progress Notes (Signed)
Vitals-CW  History reviewed. 

## 2020-11-15 NOTE — Progress Notes (Signed)
Report given to PACU, vss 

## 2020-11-15 NOTE — Op Note (Signed)
[facility] Patient Name: [name] Procedure Date: 11/15/2020 2:04 PM MRN: [id number] Endoscopist: Thornton Park MD, MD Age: 60 Referring MD:  Date of Birth: [birth year] Gender: Female Account #: [id number] Procedure:                Upper GI endoscopy Indications:              Unexplained iron deficiency anemia, Heartburn Medicines:                Monitored Anesthesia Care Procedure:                Pre-Anesthesia Assessment:                           - Prior to the procedure, a History and Physical                            was performed, and patient medications and                            allergies were reviewed. The patient's tolerance of                            previous anesthesia was also reviewed. The risks                            and benefits of the procedure and the sedation                            options and risks were discussed with the patient.                            All questions were answered, and informed consent                            was obtained. Prior Anticoagulants: The patient has                            taken no previous anticoagulant or antiplatelet                            agents. ASA Grade Assessment: II - A patient with                            mild systemic disease. After reviewing the risks                            and benefits, the patient was deemed in                            satisfactory condition to undergo the procedure.                           After obtaining informed consent, the endoscope was  passed under direct vision. Throughout the                            procedure, the patient's blood pressure, pulse, and                            oxygen saturations were monitored continuously. The                            Endoscope was introduced through the mouth, and                            advanced to the third part of duodenum. The upper                            GI  endoscopy was accomplished without difficulty.                            The patient tolerated the procedure well. Scope In: Scope Out: Findings:                 The examined esophagus was normal. Biopsies were                            obtained from the mid/proximal and distal esophagus                            with cold forceps for histology of suspected                            eosinophilic esophagitis. All of these biopsies                            were placed in one specimen container.                           Multiple small erosions with no bleeding and no                            stigmata of recent bleeding were found in the                            gastric antrum. Biopsies were taken from the                            antrum, body, and fundus with a cold forceps for                            histology. Estimated blood loss was minimal.                           The examined duodenum was normal. Biopsies were  taken with a cold forceps for histology. Estimated                            blood loss was minimal. Complications:            No immediate complications. Estimated blood loss:                            Minimal. Estimated Blood Loss:     Estimated blood loss was minimal. Impression:               - Normal esophagus. Biopsied.                           - Erosive gastropathy with no bleeding and no                            stigmata of recent bleeding. Biopsied.                           - Normal examined duodenum. Biopsied. Recommendation:           - Patient has a contact number available for                            emergencies. The signs and symptoms of potential                            delayed complications were discussed with the                            patient. Return to normal activities tomorrow.                            Written discharge instructions were provided to the                            patient.                            - Resume previous diet.                           - Away pathology results.                           - Continue present medications including Pepcid 20                            mg BID.                           - Add pantoprazole 40 mg BID x 8 weeks.                           - No aspirin, ibuprofen, naproxen, or other  non-steroidal anti-inflammatory drugs.                           - Office follow-up in 4-6 weeks, earlier if needed. Thornton Park MD, MD 11/15/2020 2:26:55 PM This report has been signed electronically.

## 2020-11-15 NOTE — Progress Notes (Signed)
Referring Provider: Colon Branch, MD Primary Care Physician:  Colon Branch, MD  Reason for Procedure:  Anemia, Reflux   IMPRESSION:  Anemia Reflux  Patient remains an appropriate candidate for endoscopy in the [facility] today   PLAN: EGD in the [facility] today   HPI: [name] is a 60 y.o. female presents for upper endoscopy to evaluate for anemia and reflux. Please see the 11/03/20 office note for complete details.     Past Medical History:  Diagnosis Date   Adenomatous colon polyp    Allergy    Asthma 1990's   GERD (gastroesophageal reflux disease)    Headache(784.0)    Palpitations    Pneumonia    Tuberculosis 1990's    Past Surgical History:  Procedure Laterality Date   APPENDECTOMY     COLONOSCOPY  08/31/2009   COLONOSCOPY  12/12/2019   COLPOSCOPY  04/28/2016   LAPAROSCOPIC APPENDECTOMY N/A 06/18/2020   Procedure: APPENDECTOMY LAPAROSCOPIC;  Surgeon: Jesusita Oka, MD;  Location: [facility] OR;  Service: General;  Laterality: N/A;   OVARIAN CYST REMOVAL      Current Outpatient Medications  Medication Sig Dispense Refill   calcium-vitamin D 250-100 MG-UNIT tablet Take 1 tablet by mouth in the morning, at noon, and at bedtime.     estradiol (ESTRACE) 0.1 MG/GM vaginal cream 1 g. (Patient not taking: Reported on 11/15/2020)     famotidine (PEPCID) 20 MG tablet Take 1 tablet (20 mg total) by mouth daily. 30 tablet 11   ferrous fumarate (HEMOCYTE - 106 MG FE) 325 (106 Fe) MG TABS tablet Take 1 tablet (106 mg of iron total) by mouth 2 (two) times daily. 60 tablet 5   gabapentin (NEURONTIN) 100 MG capsule Take 100 mg by mouth daily.     ibandronate (BONIVA) 150 MG tablet Take 1 tablet (150 mg total) by mouth every 30 (thirty) days. 3 tablet 3   Current Facility-Administered Medications  Medication Dose Route Frequency Provider Last Rate Last Admin   0.9 %  sodium chloride infusion  500 mL Intravenous Once Thornton Park, MD        Allergies as of 11/15/2020 - Review  Complete 11/15/2020  Allergen Reaction Noted   Clindamycin Diarrhea 09/12/2018    Family History  Problem Relation Age of Onset   Hypertension Mother    High Cholesterol Mother    Diabetes Paternal Grandfather    Atrial fibrillation Father        pacemaker   Other Father        blood clotting disorder   Hyperlipidemia Brother    Heart disease Other    Hypertension Other    Hyperlipidemia Other    Hypertension Other    Colon cancer Neg Hx    Breast cancer Neg Hx    Colon polyps Neg Hx      Physical Exam: General:   Alert,  well-nourished, pleasant and cooperative in NAD Head:  Normocephalic and atraumatic. Eyes:  Sclera clear, no icterus.   Conjunctiva pink. Mouth:  No deformity or lesions.   Neck:  Supple; no masses or thyromegaly. Lungs:  Clear throughout to auscultation.   No wheezes. Heart:  Regular rate and rhythm; no murmurs. Abdomen:  Soft, non-tender, nondistended, normal bowel sounds, no rebound or guarding.  Msk:  Symmetrical. No boney deformities LAD: No inguinal or umbilical LAD Extremities:  No clubbing or edema. Neurologic:  Alert and  oriented x4;  grossly nonfocal Skin:  No obvious rash or bruise. Psych:  Alert and cooperative. Normal mood and affect.    Aengus Sauceda L. Tarri Glenn, MD, MPH 11/15/2020, 2:07 PM

## 2020-11-17 ENCOUNTER — Telehealth: Payer: Self-pay

## 2020-11-17 NOTE — Telephone Encounter (Signed)
  Follow up Call-  Call back number 11/15/2020 12/12/2019  Post procedure Call Back phone  # [phone]  Permission to leave phone message Yes Yes  Some recent data might be hidden     Patient questions:  Do you have a fever, pain , or abdominal swelling? No. Pain Score  0 *  Have you tolerated food without any problems? Yes.    Have you been able to return to your normal activities? Yes.    Do you have any questions about your discharge instructions: Diet   No. Medications  No. Follow up visit  No.  Do you have questions or concerns about your Care? No.  Actions: * If pain score is 4 or above: No action needed, pain <4.  Have you developed a fever since your procedure? No   2.   Have you had an respiratory symptoms (SOB or cough) since your procedure? No   3.   Have you tested positive for COVID 19 since your procedure no   4.   Have you had any family members/close contacts diagnosed with the COVID 19 since your procedure?  No    If yes to any of these questions please route to Joylene John, RN and Joella Prince, RN

## 2020-11-18 ENCOUNTER — Ambulatory Visit (INDEPENDENT_AMBULATORY_CARE_PROVIDER_SITE_OTHER): Payer: 59

## 2020-11-18 ENCOUNTER — Other Ambulatory Visit: Payer: Self-pay

## 2020-11-18 ENCOUNTER — Encounter: Payer: Self-pay | Admitting: Gastroenterology

## 2020-11-18 ENCOUNTER — Ambulatory Visit (INDEPENDENT_AMBULATORY_CARE_PROVIDER_SITE_OTHER): Payer: 59 | Admitting: Podiatry

## 2020-11-18 ENCOUNTER — Encounter: Payer: Self-pay | Admitting: Podiatry

## 2020-11-18 DIAGNOSIS — M2041 Other hammer toe(s) (acquired), right foot: Secondary | ICD-10-CM

## 2020-11-18 DIAGNOSIS — M7751 Other enthesopathy of right foot: Secondary | ICD-10-CM | POA: Diagnosis not present

## 2020-11-18 DIAGNOSIS — M2042 Other hammer toe(s) (acquired), left foot: Secondary | ICD-10-CM

## 2020-11-18 MED ORDER — TRIAMCINOLONE ACETONIDE 10 MG/ML IJ SUSP
10.0000 mg | Freq: Once | INTRAMUSCULAR | Status: AC
  Administered 2020-11-18: 10 mg

## 2020-11-19 NOTE — Progress Notes (Signed)
Subjective:   Patient ID: [name], female   DOB: 60 y.o.   MRN: [id number]   HPI Patient states she is getting pain on her right over left fourth toe and has digital deformities bilateral and states she is tried wider shoes and soaks without relief.  States its occurred over the last couple months and patient does not smoke likes to be active   Review of Systems  All other systems reviewed and are negative.      Objective:  Physical Exam Vitals and nursing note reviewed.  Constitutional:      Appearance: She is well-developed.  Pulmonary:     Effort: Pulmonary effort is normal.  Musculoskeletal:        General: Normal range of motion.  Skin:    General: Skin is warm.  Neurological:     Mental Status: She is alert.    Neurovascular status was found to be intact muscle strength was found to be adequate quit with patient found to have rigid contracture lesser digits right over left redness around the toe and pain with pressure of the fourth digit right foot with fluid buildup.  Left is sore not to the same degree and patient does have good digital perfusion well oriented    Assessment:  Inflammatory hammertoe deformity right over left fourth digit with fluid buildup right fourth toe     Plan:  H&P all conditions reviewed and sterile prep done and I anesthetized the fourth digit and then injected the inner phalangeal 3 mg dexamethasone and applied cushioning and discussed digital fusion procedure or arthroplasty which may be necessary in future.  Reappoint to recheck  X-rays indicate significant digital deformities with elevation of the fourth toe bilateral the worst problem

## 2020-11-22 ENCOUNTER — Other Ambulatory Visit: Payer: Self-pay | Admitting: Internal Medicine

## 2020-12-08 ENCOUNTER — Ambulatory Visit: Payer: 59 | Admitting: Cardiovascular Disease

## 2020-12-31 ENCOUNTER — Ambulatory Visit (INDEPENDENT_AMBULATORY_CARE_PROVIDER_SITE_OTHER): Payer: 59 | Admitting: Gastroenterology

## 2020-12-31 ENCOUNTER — Encounter: Payer: Self-pay | Admitting: Gastroenterology

## 2020-12-31 ENCOUNTER — Other Ambulatory Visit (INDEPENDENT_AMBULATORY_CARE_PROVIDER_SITE_OTHER): Payer: 59

## 2020-12-31 VITALS — BP 100/58 | HR 42 | Ht 60.0 in | Wt 118.6 lb

## 2020-12-31 DIAGNOSIS — D509 Iron deficiency anemia, unspecified: Secondary | ICD-10-CM | POA: Diagnosis not present

## 2020-12-31 DIAGNOSIS — K319 Disease of stomach and duodenum, unspecified: Secondary | ICD-10-CM

## 2020-12-31 LAB — IRON: Iron: 63 ug/dL (ref 42–145)

## 2020-12-31 LAB — HEMOGLOBIN: Hemoglobin: 12.7 g/dL (ref 12.0–15.0)

## 2020-12-31 MED ORDER — PANTOPRAZOLE SODIUM 40 MG PO TBEC: 40.0000 mg | DELAYED_RELEASE_TABLET | Freq: Two times a day (BID) | ORAL | 2 refills | Status: DC

## 2020-12-31 NOTE — Progress Notes (Signed)
Referring Provider: Colon Branch, MD Primary Care Physician:  Colon Branch, MD  Chief complaint:  Constipation   IMPRESSION:  Reflux esophagitis controlled with famotidine and pantoprazole New diagnosis of iron deficiency Low B12 Increased intraepithelial lymphocytes on duodenal biopsies Epigastric abdominal pain and bloating    - likely related to constipation or constipation-IBS 11 mm right hepatic lobe hemangioma 8.5 mm left hepatic lobe cyst Fatty liver noted on ultrasound but not recent CT scan or MRI Persistently normal liver enzymes dating back to 2008 Asymptomatic cholelithiasis Tubular adenoma on colonoscopy 2021    - surveillance colonoscopy recommended 2028 Chronic constipation controlled with prunes and coffee    - discussed bran:applesauce:prune juice as a supplement to avoid medications    - declines prescription therapy Mild scattered diverticulosis    - high fiber diet, increase daily water intake  Recent appendectomy with mild pain at the surgical site   Reflux esophagitis, mild gastritis, and increase intraepithelial lymphocytes on EGD 11/15/20. Clinical symptoms improvement on PPI and BID. However, she would prefer to avoid medications. We discussed treatment for at least 10 weeks. PTTGA and IgA to exclude concurrent celiac, particularly given her newly diagnosed iron deficiency.   Chronic constipation exacerbated by iron supplements: Will change dosing to QOD to try to minimize symptoms.   Iron deficiency without overt or occult bleeding: Not explained by EGD or colonoscopy. Labs today to follow-up as she has been on iron supplements with labs labs 10/06/20. At that time, iron 106, ferritin 9.9.   Consider capsule endoscopy if not responding to iron supplements.   Abnormal MRI. Reviewed 09/24/20 MRI findings again to answer questions. Reassurance provided. No specific follow-up indicated at this time.      PLAN: - Complete 10 weeks of pantopraozole BID  -  Continue famotidine BID - Avoid NSAIDs - Ferritin, iron, hemoglobin - TTGA and IgA - Use iron supplements every other day, discussed Slow Fe - Resume B12 supplements - Consider evaluation of small bowel if she is not responding to iron supplements - Referral to hematologist if she remains iron deficient   Please see the "Patient Instructions" section for addition details about the plan.  HPI: [name] is a 60 y.o. female who returns in follow-up. She was last seen in the office 11/03/20 and had an upper endoscopy 11/15/20. She has previously seen Dr. Fuller Plan for chronic constipation, periumbilical abdominal pain, bloating, and reflux. Lifetime history of chronic idiopathic constipation versus constipation-predominant IBS controlled with prunes and coffee. Her husband accompanies her to this appointment and brings a list of notes and questions.    Seen in ED 06/18/20 for lower abdominal pain. CMP, lipase, and CBC were normal. I have personally reviewed the images from the CT abd/pelvis with contrast showed acute appendicitis as well as nonspecific 0.8 by 0.5 cm hypodense lesion in the lateral segment left hepatic lobe on image 19 series 3, not readily apparent on 03/30/2014. 1.2 by 0.9 cm densely enhancing lesion in the right hepatic lobe on image 16 of series 3, this was present on 03/30/2014 with appearance favoring hemangioma. Several gallstones are present in the gallbladder measuring up to 2.3 cm in long axis.  Has otherwise not been feeling well since her appendectomy 06/18/20 with ongoing fatigue, left abdominal discomfort, and lower back pain.  Liver enzymes have been normal back to 2008. Platelets are normal.   Follow-up MRI with and without contrast 09/24/2020 showed a 11 mm right hepatic lobe hemangioma and an 8.5  mm left hepatic lobe benign cyst.  Cholelithiasis present.  Had EGD 11/15/20 to evaluate reflux. EGD 11/15/20 gastric erosions. No h pylori. Focal increased intraepitheliala  lymphocytes. Esophageal biopsies showed reflux.   Abdominal pain has improved on pantroprazole and famotidine.   Found to have iron deficiency anemia. Stool negative for blood. But she was started on iron and this is resulting in severe constipation. She is now only having a bowel movements twice a week. Her husband is concerned that this is not frequent enough.   She follows a heart healthy diet. Does not eat mammalian meats.   No new complaints or concerns.   Prior endoscopic evaluation: - Colonoscopy with Dr. Fuller Plan 08/31/2009 and 12/12/2019. Cecal tubular adenoma removed 2021. Surveillance colonoscopy was recommended in 7 years.   - EGD 11/15/20 to evaluate reflux. EGD 11/15/20 gastric erosions. No h pylori. Focal increased intraepitheliala lymphocytes. Esophageal biopsies showed reflux.   Past Medical History:  Diagnosis Date   Adenomatous colon polyp    Allergy    Asthma 1990's   GERD (gastroesophageal reflux disease)    Headache(784.0)    Palpitations    Pneumonia    Tuberculosis 1990's    Past Surgical History:  Procedure Laterality Date   APPENDECTOMY     COLONOSCOPY  08/31/2009   COLONOSCOPY  12/12/2019   COLPOSCOPY  04/28/2016   LAPAROSCOPIC APPENDECTOMY N/A 06/18/2020   Procedure: APPENDECTOMY LAPAROSCOPIC;  Surgeon: Jesusita Oka, MD;  Location: [facility] OR;  Service: General;  Laterality: N/A;   OVARIAN CYST REMOVAL      Current Outpatient Medications  Medication Sig Dispense Refill   calcium-vitamin D 250-100 MG-UNIT tablet Take 1 tablet by mouth in the morning, at noon, and at bedtime.     estradiol (ESTRACE) 0.1 MG/GM vaginal cream      famotidine (PEPCID) 20 MG tablet Take 1 tablet (20 mg total) by mouth daily. 30 tablet 11   ferrous fumarate (HEMOCYTE - 106 MG FE) 325 (106 Fe) MG TABS tablet Take 1 tablet (106 mg of iron total) by mouth 2 (two) times daily. 60 tablet 5   gabapentin (NEURONTIN) 100 MG capsule Take 100 mg by mouth daily.     ibandronate (BONIVA)  150 MG tablet TAKE ONE TABLET BY MOUTH EVERY 30 DAYS 3 tablet 3   pantoprazole (PROTONIX) 40 MG tablet Take 1 tablet (40 mg total) by mouth 2 (two) times daily before a meal. Pantoprazole 40 mg twice a day for 8 weeks. 60 tablet 2   No current facility-administered medications for this visit.    Allergies as of 12/31/2020 - Review Complete 12/31/2020  Allergen Reaction Noted   Clindamycin Diarrhea 09/12/2018    Family History  Problem Relation Age of Onset   Hypertension Mother    High Cholesterol Mother    Diabetes Paternal Grandfather    Atrial fibrillation Father        pacemaker   Other Father        blood clotting disorder   Hyperlipidemia Brother    Heart disease Other    Hypertension Other    Hyperlipidemia Other    Hypertension Other    Colon cancer Neg Hx    Breast cancer Neg Hx    Colon polyps Neg Hx       Physical Exam: General:   Alert, in NAD. No scleral icterus. No bilateral temporal wasting.  Heart:  Regular rate and rhythm; no murmurs Pulm: Clear anteriorly; no wheezing Abdomen:  Soft. Nontender. Nondistended. Normal bowel  sounds. No rebound or guarding. No fluid wave.  LAD: No inguinal or umbilical LAD Extremities:  Without edema. Neurologic:  Alert and  oriented x4;  grossly normal neurologically; no asterixis or clonus. Skin: No jaundice. No palmar erythema or spider angioma.  Psych:  Alert and cooperative. Normal mood and affect.       Ashly Yepez L. Tarri Glenn, MD, MPH 12/31/2020, 4:18 PM

## 2020-12-31 NOTE — Patient Instructions (Addendum)
I recommend some lab work today to follow-up on your anemia.  If the ferritin, remains low, we may need to have you see the blood specialist.   When you are taking iron supplements, taking it every other day works just as well. You may find that Slow FE (a specific formulation of iron) causes less constipation.  Make sure you are drinking enough water and use a fiber supplement every day (such as psyllium or methycellulose).   I recommend that you continue the pantoprazole and famotidine at this time. Hopefully, after another month of pantoprazole, you can reduce the dose to once daily. After two additional weeks, you could reduce the pantoprazole to every other day for another week.   You do not need to have any additional imaging of your liver cyst and hemangioma.   We have sent in refills of your pantoprazole  Due to recent changes in healthcare laws, you may see the results of your imaging and laboratory studies on MyChart before your provider has had a chance to review them.  We understand that in some cases there may be results that are confusing or concerning to you. Not all laboratory results come back in the same time frame and the provider may be waiting for multiple results in order to interpret others.  Please give [place] 48 hours in order for your provider to thoroughly review all the results before contacting the office for clarification of your results.    If you are age 106 or older, your body mass index should be between 23-30. Your Body mass index is 23.16 kg/m. If this is out of the aforementioned range listed, please consider follow up with your Primary Care Provider.  If you are age 66 or younger, your body mass index should be between 19-25. Your Body mass index is 23.16 kg/m. If this is out of the aformentioned range listed, please consider follow up with your Primary Care Provider.   ________________________________________________________  The [facility] providers would like  to encourage you to use [facility] to communicate with providers for non-urgent requests or questions.  Due to long hold times on the telephone, sending your provider a message by [facility] may be a faster and more efficient way to get a response.  Please allow 48 business hours for a response.  Please remember that this is for non-urgent requests.  _______________________________________________________   I appreciate the  opportunity to care for you  Thank You   Santo Held

## 2021-01-03 LAB — FERRITIN: Ferritin: 14.7 ng/mL (ref 10.0–291.0)

## 2021-01-03 LAB — TISSUE TRANSGLUTAMINASE, IGA: (tTG) Ab, IgA: <1 U/mL

## 2021-01-03 LAB — IGA: Immunoglobulin A: 146 mg/dL (ref 47–310)

## 2021-01-04 ENCOUNTER — Other Ambulatory Visit: Payer: Self-pay

## 2021-01-04 DIAGNOSIS — D509 Iron deficiency anemia, unspecified: Secondary | ICD-10-CM

## 2021-01-07 ENCOUNTER — Telehealth: Payer: Self-pay | Admitting: Hematology

## 2021-01-07 NOTE — Telephone Encounter (Signed)
Scheduled appt per 11/1 referral. Spoke to pt's husband who is aware of appt and time.

## 2021-01-17 ENCOUNTER — Other Ambulatory Visit: Payer: Self-pay | Admitting: Orthopedic Surgery

## 2021-01-17 DIAGNOSIS — M5412 Radiculopathy, cervical region: Secondary | ICD-10-CM

## 2021-01-17 DIAGNOSIS — M50122 Cervical disc disorder at C5-C6 level with radiculopathy: Secondary | ICD-10-CM

## 2021-01-17 DIAGNOSIS — M4722 Other spondylosis with radiculopathy, cervical region: Secondary | ICD-10-CM

## 2021-01-24 ENCOUNTER — Other Ambulatory Visit: Payer: Self-pay

## 2021-01-24 ENCOUNTER — Inpatient Hospital Stay: Payer: 59 | Attending: Hematology | Admitting: Hematology

## 2021-01-24 ENCOUNTER — Inpatient Hospital Stay: Payer: 59

## 2021-01-24 VITALS — BP 90/64 | HR 77 | Temp 97.7°F | Resp 16 | Ht 60.0 in | Wt 118.7 lb

## 2021-01-24 DIAGNOSIS — E559 Vitamin D deficiency, unspecified: Secondary | ICD-10-CM | POA: Diagnosis not present

## 2021-01-24 DIAGNOSIS — D509 Iron deficiency anemia, unspecified: Secondary | ICD-10-CM

## 2021-01-24 DIAGNOSIS — E538 Deficiency of other specified B group vitamins: Secondary | ICD-10-CM | POA: Insufficient documentation

## 2021-01-24 LAB — CMP (CANCER CENTER ONLY)
ALT: 18 U/L (ref 0–44)
AST: 18 U/L (ref 15–41)
Albumin: 3.7 g/dL (ref 3.5–5.0)
Alkaline Phosphatase: 68 U/L (ref 38–126)
Anion gap: 7 (ref 5–15)
BUN: 13 mg/dL (ref 6–20)
CO2: 25 mmol/L (ref 22–32)
Calcium: 8.4 mg/dL — ABNORMAL LOW (ref 8.9–10.3)
Chloride: 108 mmol/L (ref 98–111)
Creatinine: 0.62 mg/dL (ref 0.44–1.00)
GFR, Estimated: >60 mL/min (ref >60)
Glucose, Bld: 88 mg/dL (ref 70–99)
Potassium: 3.6 mmol/L (ref 3.5–5.1)
Sodium: 140 mmol/L (ref 135–145)
Total Bilirubin: 0.3 mg/dL (ref 0.3–1.2)
Total Protein: 6.8 g/dL (ref 6.5–8.1)

## 2021-01-24 LAB — CBC WITH DIFFERENTIAL/PLATELET
Abs Immature Granulocytes: 0.01 10*3/uL (ref 0.00–0.07)
Basophils Absolute: 0.1 10*3/uL (ref 0.0–0.1)
Basophils Relative: 1 %
Eosinophils Absolute: 0.3 10*3/uL (ref 0.0–0.5)
Eosinophils Relative: 6 %
HCT: 35.8 % — ABNORMAL LOW (ref 36.0–46.0)
Hemoglobin: 11.6 g/dL — ABNORMAL LOW (ref 12.0–15.0)
Immature Granulocytes: 0 %
Lymphocytes Relative: 33 %
Lymphs Abs: 1.8 10*3/uL (ref 0.7–4.0)
MCH: 30.1 pg (ref 26.0–34.0)
MCHC: 32.4 g/dL (ref 30.0–36.0)
MCV: 92.7 fL (ref 80.0–100.0)
Monocytes Absolute: 0.3 10*3/uL (ref 0.1–1.0)
Monocytes Relative: 5 %
Neutro Abs: 3.1 10*3/uL (ref 1.7–7.7)
Neutrophils Relative %: 55 %
Platelets: 245 10*3/uL (ref 150–400)
RBC: 3.86 MIL/uL — ABNORMAL LOW (ref 3.87–5.11)
RDW: 13.2 % (ref 11.5–15.5)
WBC: 5.6 10*3/uL (ref 4.0–10.5)
nRBC: 0 % (ref 0.0–0.2)

## 2021-01-24 LAB — VITAMIN D 25 HYDROXY (VIT D DEFICIENCY, FRACTURES): Vit D, 25-Hydroxy: 30.97 ng/mL (ref 30–100)

## 2021-01-24 LAB — TSH: TSH: 0.893 u[IU]/mL (ref 0.308–3.960)

## 2021-01-24 LAB — VITAMIN B12: Vitamin B-12: 289 pg/mL (ref 180–914)

## 2021-01-24 NOTE — Progress Notes (Signed)
[facility]   HEMATOLOGY/ONCOLOGY CONSULTATION NOTE  Date of Service: 01/24/2021  Patient Care Team: Colon Branch, MD as PCP - General (Internal Medicine) Vanessa Kick, MD as Consulting Physician (Obstetrics and Gynecology)  CHIEF COMPLAINTS/PURPOSE OF CONSULTATION:  Anemia  HISTORY OF PRESENTING ILLNESS:  [name] is a wonderful 60 y.o. female who has been referred to [place] by Dr Thornton Park MD for evaluation and management of anemia.  Patient has a history of GERD, asthma, nasal allergies, remote history of tuberculosis status posttreatment who was on labs with her primary care physician on 10/06/2020 was noted to have Mild anemia with a hemoglobin of 11.9, MCV of 91 with normal WBC count of 5.4k and normal platelets of 223k. Patient was noted to have iron deficiency with a ferritin of 9.9 and also noted to have B12 deficiency with a B12 level of 174.  Folate was 19.2. His fecal occult blood testing was negative.  She was referred to Dr. Thornton Park for a gastroenterology evaluation and had an EGD on 11/15/2020 which showed erosive gastropathy with no bleeding or stigmata of recent bleeding. Biopsied.  Pathology did not show any Helicobacter pylori. She has been placed on pantoprazole by Dr. Tarri Glenn.  Patient notes that she has previously had a colonoscopy in October 2021 which showed an 8 mm polyp in the cecum which was resected.  No other evidence of GI bleeding.  She had lab testing which ruled out celiac disease.  She was referred to [place] for further evaluation and management of her iron deficiency. Patient notes that she has been on ferrous fumarate 1 tablet p.o. daily by Dr. Webb Laws since October 18, 2020.  Patient notes no significant abdominal discomfort at this time.  No sudden unexpected weight loss. No fevers no chills no night sweats no new lumps or bumps.  Patient denies any family history of blood disorders. Notes that she has been having tingling and discomfort in both  her hands and wonders if she has any disc problems in her neck.  She does have an MRI of the C-spine pending and has been scheduled on 02/03/2021.  MEDICAL HISTORY:  Past Medical History:  Diagnosis Date   Adenomatous colon polyp    Allergy    Asthma 1990's   GERD (gastroesophageal reflux disease)    Headache(784.0)    Palpitations    Pneumonia    Tuberculosis 1990's    SURGICAL HISTORY: Past Surgical History:  Procedure Laterality Date   APPENDECTOMY     COLONOSCOPY  08/31/2009   COLONOSCOPY  12/12/2019   COLPOSCOPY  04/28/2016   LAPAROSCOPIC APPENDECTOMY N/A 06/18/2020   Procedure: APPENDECTOMY LAPAROSCOPIC;  Surgeon: Jesusita Oka, MD;  Location: [facility] OR;  Service: General;  Laterality: N/A;   OVARIAN CYST REMOVAL      SOCIAL HISTORY: Social History   Socioeconomic History   Marital status: Married    Spouse name: Not on file   Number of children: 0   Years of education: Not on file   Highest education level: Not on file  Occupational History   Occupation: Scientist, water quality [employer]  Tobacco Use   Smoking status: Never   Smokeless tobacco: Never  Vaping Use   Vaping Use: Never used  Substance and Sexual Activity   Alcohol use: Yes    Comment: occasional wine   Drug use: No   Sexual activity: Not on file  Other Topics Concern   Not on file  Social History Narrative   From [place]  [id number]   Social Determinants of Health   Financial Resource Strain: Not on file  Food Insecurity: Not on file  Transportation Needs: Not on file  Physical Activity: Not on file  Stress: Not on file  Social Connections: Not on file  Intimate Partner Violence: Not on file    FAMILY HISTORY: Family History  Problem Relation Age of Onset   Hypertension Mother    High Cholesterol Mother    Diabetes Paternal Grandfather    Atrial fibrillation Father        pacemaker   Other Father        blood clotting disorder   Hyperlipidemia Brother    Heart disease Other    Hypertension  Other    Hyperlipidemia Other    Hypertension Other    Colon cancer Neg Hx    Breast cancer Neg Hx    Colon polyps Neg Hx     ALLERGIES:  is allergic to clindamycin.  MEDICATIONS:  Current Outpatient Medications  Medication Sig Dispense Refill   calcium-vitamin D 250-100 MG-UNIT tablet Take 1 tablet by mouth in the morning, at noon, and at bedtime.     estradiol (ESTRACE) 0.1 MG/GM vaginal cream      famotidine (PEPCID) 20 MG tablet Take 1 tablet (20 mg total) by mouth daily. 30 tablet 11   ferrous fumarate (HEMOCYTE - 106 MG FE) 325 (106 Fe) MG TABS tablet Take 1 tablet (106 mg of iron total) by mouth 2 (two) times daily. 60 tablet 5   gabapentin (NEURONTIN) 100 MG capsule Take 100 mg by mouth daily.     ibandronate (BONIVA) 150 MG tablet TAKE ONE TABLET BY MOUTH EVERY 30 DAYS 3 tablet 3   pantoprazole (PROTONIX) 40 MG tablet Take 1 tablet (40 mg total) by mouth 2 (two) times daily before a meal. Pantoprazole 40 mg twice a day for 8 weeks. 60 tablet 2   No current facility-administered medications for this visit.    REVIEW OF SYSTEMS:    .10 Point review of Systems was done is negative except as noted above.  PHYSICAL EXAMINATION: ECOG PERFORMANCE STATUS: 1 - Symptomatic but completely ambulatory  . Vitals:   01/24/21 1106  BP: (!) 89/51  Pulse: 77  Resp: 16  Temp: 97.7 F (36.5 C)  SpO2: 100%   Filed Weights   01/24/21 1106  Weight: 118 lb 11.2 oz (53.8 kg)   .Body mass index is 23.18 kg/m.  GENERAL:alert, in no acute distress and comfortable SKIN: no acute rashes, no significant lesions EYES: conjunctiva are pink and non-injected, sclera anicteric OROPHARYNX: MMM, no exudates, no oropharyngeal erythema or ulceration NECK: supple, no JVD LYMPH:  no palpable lymphadenopathy in the cervical, axillary or inguinal regions LUNGS: clear to auscultation b/l with normal respiratory effort HEART: regular rate & rhythm ABDOMEN:  normoactive bowel sounds , non tender,  not distended. Extremity: no pedal edema PSYCH: alert & oriented x 3 with fluent speech NEURO: no focal motor/sensory deficits  LABORATORY DATA:  I have reviewed the data as listed  . CBC Latest Ref Rng & Units 01/24/2021 01/24/2021 12/31/2020  WBC 4.0 - 10.5 K/uL 5.6 - -  Hemoglobin 12.0 - 15.0 g/dL 11.6(L) - 12.7  Hematocrit 34.0 - 46.6 % 36.5 35.8(L) -  Platelets 150 - 400 K/uL 245 - -   .CBC    Component Value Date/Time   WBC 5.6 01/24/2021 1427   RBC 3.86 (L) 01/24/2021 1427   HGB 11.6 (L) 01/24/2021 1427  HGB 13.2 08/15/2019 1519   HCT 36.5 01/24/2021 1428   HCT 35.8 (L) 01/24/2021 1427   PLT 245 01/24/2021 1427   PLT 254 08/15/2019 1519   MCV 92.7 01/24/2021 1427   MCV 93 08/15/2019 1519   MCH 30.1 01/24/2021 1427   MCHC 32.4 01/24/2021 1427   RDW 13.2 01/24/2021 1427   RDW 12.9 08/15/2019 1519   LYMPHSABS 1.8 01/24/2021 1427   LYMPHSABS 2.6 08/15/2019 1519   MONOABS 0.3 01/24/2021 1427   EOSABS 0.3 01/24/2021 1427   EOSABS 0.7 (H) 08/15/2019 1519   BASOSABS 0.1 01/24/2021 1427   BASOSABS 0.1 08/15/2019 1519    . CMP Latest Ref Rng & Units 10/06/2020 09/17/2020 06/18/2020  Glucose 70 - 99 mg/dL 86 - 111(H)  BUN 6 - 23 mg/dL 10 11 8   Creatinine 0.40 - 1.20 mg/dL 0.55 0.53 0.57  Sodium 135 - 145 mEq/L 138 - 136  Potassium 3.5 - 5.1 mEq/L 4.0 - 3.3(L)  Chloride 96 - 112 mEq/L 104 - 105  CO2 19 - 32 mEq/L 27 - 26  Calcium 8.4 - 10.5 mg/dL 9.0 - 8.6(L)  Total Protein 6.0 - 8.3 g/dL 6.7 - 6.8  Total Bilirubin 0.2 - 1.2 mg/dL 0.6 - 1.0  Alkaline Phos 39 - 117 U/L 56 - 55  AST 0 - 37 U/L 15 - 32  ALT 0 - 35 U/L 14 - 26   EGD 11/15/2020: - The examined esophagus was normal. Biopsies were obtained from the mid/proximal and distal esophagus with cold forceps for histology of suspected eosinophilic esophagitis. All of these biopsies were placed in one specimen container. Findings: - Multiple small erosions with no bleeding and no stigmata of recent bleeding were  found in the gastric antrum. Biopsies were taken from the antrum, body, and fundus with a cold forceps for histology. Estimated blood loss was minimal. - The examined duodenum was normal. Biopsies were taken with a cold forceps for histology  Colonoscopy 12/12/2019- The perianal and digital rectal examinations were normal. - A 8 mm polyp was found in the cecum. The polyp was sessile. The polyp was removed with a cold snare. Resection and retrieval were complete. - Scattered small-mouthed diverticula were found in the entire colon. There was no evidence of diverticular bleeding. - The exam was otherwise without abnormality on direct and retroflexion views.  RADIOGRAPHIC STUDIES: I have personally reviewed the radiological images as listed and agreed with the findings in the report. No results found.  ASSESSMENT & PLAN:   60 year old with  #1 iron deficiency anemia Patient has had GI work-up EGD showed erosive gastropathy and patient is currently on PPI.  Helicobacter pylori negative. Colonoscopy in August 2021 showed tubular adenoma presenting as a cecal polyp 8 mm.  This has been resected. Recent fecal occult blood testing in August was negative. In the presence of other associated B12 and vitamin D deficiency cannot rule out an absorption issue. Serology for celiac disease negative Will rule out pernicious anemia Patient has been on oral iron since August 2022  #2 B12 deficiency  No dietary restrictions.?  Absorption issue.  #3 vitamin D deficiency PLAN Available labs reviewed with the patient. We discussed different etiologies of her iron and B12 deficiency in details. Will check labs today to see if her oral iron therapy since August 2022 is improving and iron levels.  Her anemia is currently fairly mild with hemoglobin in the 11-12 range. Recommended patient take sublingual B12 at least 1000 mcg daily.  We  will recheck B12 levels as well Will evaluate labs and discussed this  with the patient to determine need for IV iron or parenteral B12 replacement. Recommended patient continue balanced diet.  FOLLOWUP Labs today Phone visit with Dr Irene Limbo in 1-2 weeks   . Orders Placed This Encounter  Procedures   CBC with Differential/Platelet    Standing Status:   Future    Number of Occurrences:   1    Standing Expiration Date:   01/24/2022   CMP ([place] only)    Standing Status:   Future    Number of Occurrences:   1    Standing Expiration Date:   01/24/2022   Vitamin B12    Standing Status:   Future    Number of Occurrences:   1    Standing Expiration Date:   01/24/2022   Folate RBC    Standing Status:   Future    Number of Occurrences:   1    Standing Expiration Date:   01/24/2022   Ferritin    Standing Status:   Future    Number of Occurrences:   1    Standing Expiration Date:   01/24/2022   Iron and TIBC    Standing Status:   Future    Number of Occurrences:   1    Standing Expiration Date:   01/24/2022   Anti-parietal antibody    Standing Status:   Future    Number of Occurrences:   1    Standing Expiration Date:   01/24/2022   Intrinsic factor antibodies    Standing Status:   Future    Number of Occurrences:   1    Standing Expiration Date:   01/24/2022   TSH    Standing Status:   Future    Number of Occurrences:   1    Standing Expiration Date:   01/24/2022   Vitamin D 25 hydroxy    Standing Status:   Future    Number of Occurrences:   1    Standing Expiration Date:   01/24/2022   Copper, serum    Standing Status:   Future    Number of Occurrences:   1    Standing Expiration Date:   01/24/2022    All of the patients questions were answered with apparent satisfaction. The patient knows to call the clinic with any problems, questions or concerns.  I spent 30 minutes counseling the patient face to face. The total time spent in the appointment was 45 minutes and more than 50% was on counseling and direct patient cares.     Sullivan Lone MD Sandy AAHIVMS [facility] Hematology/Oncology Physician [facility]  (Office):       [phone] (Work cell):  [phone] (Fax):           [phone]  01/24/2021 12:08 PM

## 2021-01-25 LAB — FOLATE RBC
Folate, Hemolysate: 335 ng/mL
Folate, RBC: 918 ng/mL (ref >498)
Hematocrit: 36.5 % (ref 34.0–46.6)

## 2021-01-25 LAB — IRON AND TIBC
Iron: 58 ug/dL (ref 41–142)
Saturation Ratios: 19 % — ABNORMAL LOW (ref 21–57)
TIBC: 309 ug/dL (ref 236–444)
UIBC: 251 ug/dL (ref 120–384)

## 2021-01-25 LAB — FERRITIN: Ferritin: 23 ng/mL (ref 11–307)

## 2021-01-25 LAB — ANTI-PARIETAL ANTIBODY: Parietal Cell Antibody-IgG: 6.8 Units (ref 0.0–20.0)

## 2021-01-25 LAB — INTRINSIC FACTOR ANTIBODIES: Intrinsic Factor: 1 AU/mL (ref 0.0–1.1)

## 2021-01-27 LAB — COPPER, SERUM: Copper: 111 ug/dL (ref 80–158)

## 2021-01-28 NOTE — Progress Notes (Deleted)
CARDIOLOGY CONSULT NOTE       Patient ID: [name] MRN: [id number] DOB/AGE: [birth year] 60 y.o.  Admit date: (Not on file) Referring Physician: Larose Kells Primary Physician: Colon Branch, MD Primary Cardiologist: New Reason for Consultation: Chest pain  Active Problems:   * No active hospital problems. *   HPI:  60 y.o. referred by Dr Larose Kells for chest pain and dizziness. 06/2020 had appendectomy and has had a lot of GI issues with reflux esophagitis , iron deficiency anemia, constipation, polyps nad left lobe liver hemangioma. Seen in 2015 for atypical chest pain had normal ETT and echo Also had normal ETT in 04/06/14  ***  ROS All other systems reviewed and negative except as noted above  Past Medical History:  Diagnosis Date   Adenomatous colon polyp    Allergy    Asthma 1990's   GERD (gastroesophageal reflux disease)    Headache(784.0)    Palpitations    Pneumonia    Tuberculosis 72's    Family History  Problem Relation Age of Onset   Hypertension Mother    High Cholesterol Mother    Diabetes Paternal Grandfather    Atrial fibrillation Father        pacemaker   Other Father        blood clotting disorder   Hyperlipidemia Brother    Heart disease Other    Hypertension Other    Hyperlipidemia Other    Hypertension Other    Colon cancer Neg Hx    Breast cancer Neg Hx    Colon polyps Neg Hx     Social History   Socioeconomic History   Marital status: Married    Spouse name: Not on file   Number of children: 0   Years of education: Not on file   Highest education level: Not on file  Occupational History   Occupation: Scientist, water quality [employer]  Tobacco Use   Smoking status: Never   Smokeless tobacco: Never  Vaping Use   Vaping Use: Never used  Substance and Sexual Activity   Alcohol use: Yes    Comment: occasional wine   Drug use: No   Sexual activity: Not on file  Other Topics Concern   Not on file  Social History Narrative   From [place]    Social Determinants of Health   Financial Resource Strain: Not on file  Food Insecurity: Not on file  Transportation Needs: Not on file  Physical Activity: Not on file  Stress: Not on file  Social Connections: Not on file  Intimate Partner Violence: Not on file    Past Surgical History:  Procedure Laterality Date   APPENDECTOMY     COLONOSCOPY  08/31/2009   COLONOSCOPY  12/12/2019   COLPOSCOPY  04/28/2016   LAPAROSCOPIC APPENDECTOMY N/A 06/18/2020   Procedure: APPENDECTOMY LAPAROSCOPIC;  Surgeon: Jesusita Oka, MD;  Location: [facility] OR;  Service: General;  Laterality: N/A;   OVARIAN CYST REMOVAL        Current Outpatient Medications:    calcium-vitamin D 250-100 MG-UNIT tablet, Take 1 tablet by mouth in the morning, at noon, and at bedtime., Disp: , Rfl:    estradiol (ESTRACE) 0.1 MG/GM vaginal cream, , Disp: , Rfl:    famotidine (PEPCID) 20 MG tablet, Take 1 tablet (20 mg total) by mouth daily., Disp: 30 tablet, Rfl: 11   ferrous fumarate (HEMOCYTE - 106 MG FE) 325 (106 Fe) MG TABS tablet, Take 1 tablet (106 mg of iron total)  by mouth 2 (two) times daily., Disp: 60 tablet, Rfl: 5   gabapentin (NEURONTIN) 100 MG capsule, Take 100 mg by mouth daily., Disp: , Rfl:    ibandronate (BONIVA) 150 MG tablet, TAKE ONE TABLET BY MOUTH EVERY 30 DAYS, Disp: 3 tablet, Rfl: 3   pantoprazole (PROTONIX) 40 MG tablet, Take 1 tablet (40 mg total) by mouth 2 (two) times daily before a meal. Pantoprazole 40 mg twice a day for 8 weeks., Disp: 60 tablet, Rfl: 2    Physical Exam: There were no vitals taken for this visit.    Affect appropriate Healthy:  appears stated age 60: normal Neck supple with no adenopathy JVP normal no bruits no thyromegaly Lungs clear with no wheezing and good diaphragmatic motion Heart:  S1/S2 no murmur, no rub, gallop or click PMI normal Abdomen: benighn, post appendectomy  Distal pulses intact with no bruits No edema Neuro non-focal Skin warm and dry No  muscular weakness   Labs:   Lab Results  Component Value Date   WBC 5.6 01/24/2021   HGB 11.6 (L) 01/24/2021   HCT 36.5 01/24/2021   MCV 92.7 01/24/2021   PLT 245 01/24/2021    Recent Labs  Lab 01/24/21 1427  NA 140  K 3.6  CL 108  CO2 25  BUN 13  CREATININE 0.62  CALCIUM 8.4*  PROT 6.8  BILITOT 0.3  ALKPHOS 68  ALT 18  AST 18  GLUCOSE 88   Lab Results  Component Value Date   CKTOTAL 117 02/18/2016   CKMB 1.0 11/16/2006   TROPONINI 0.04        NO INDICATION OF MYOCARDIAL INJURY. 11/16/2006    Lab Results  Component Value Date   CHOL 239 (H) 10/06/2020   CHOL 235 (H) 08/15/2019   CHOL 208 (H) 07/10/2017   Lab Results  Component Value Date   HDL 67.20 10/06/2020   HDL 83 08/15/2019   HDL 60.60 07/10/2017   Lab Results  Component Value Date   LDLCALC 139 (H) 10/06/2020   LDLCALC 137 (H) 08/15/2019   LDLCALC 123 (H) 07/10/2017   Lab Results  Component Value Date   TRIG 164.0 (H) 10/06/2020   TRIG 88 08/15/2019   TRIG 124.0 07/10/2017   Lab Results  Component Value Date   CHOLHDL 4 10/06/2020   CHOLHDL 2.8 08/15/2019   CHOLHDL 3 07/10/2017   No results found for: LDLDIRECT    Radiology: No results found.  EKG: ***   ASSESSMENT AND PLAN:   Chest pain:  *** GI:  f/u Dr Tarri Glenn for anemia, polyps, esophagitis and constipation Continue Protonix and hemocyte    *** Signed: Jenkins Rouge 01/28/2021, 9:40 AM

## 2021-02-03 ENCOUNTER — Other Ambulatory Visit: Payer: 59

## 2021-02-04 ENCOUNTER — Ambulatory Visit: Payer: 59 | Admitting: Cardiovascular Disease

## 2021-02-07 NOTE — Progress Notes (Signed)
Cardiology Office Note:    Date:  02/10/2021   ID:  [name], DOB 23-Nov-1960, MRN [id number]  PCP:  Robin Branch, MD   [facility] Providers Cardiologist:  Robin Sciara, MD Referring MD: Robin Branch, MD   Chief Complaint/Reason for Referral:  Aortic atheroslcerosis  ASSESSMENT & PLAN:    Abdominal aortic atherosclerosis [facility]) Given the imaging evidence of atherosclerosis she should be on a statin as well as aspirin.  I will start atorvastatin 40 mg aspirin 81 mg.  We will check a CBC due to her prior anemia as well as a CMP and lipid panel in 2 months.  Her LDL goal is less than 100 and a possible less than 70.  We will see her back in 6 months or earlier if needed.  Hyperlipidemia, unspecified hyperlipidemia type See discussion above.  She and her husband are worried about side effects.  If she does develop adverse effects from this medication we will refer her to lipid clinic for further management.  Dizziness Will obtain a 3-day ZIO monitor and echocardiogram.            Dispo:  No follow-ups on file.      Medication Adjustments/Labs and Tests Ordered: Current medicines are reviewed at length with the patient today.  Concerns regarding medicines are outlined above.    Tests Ordered: Orders Placed This Encounter  Procedures   CBC   Lipid panel   Comprehensive metabolic panel   LONG TERM MONITOR (3-14 DAYS)   EKG 12-Lead   ECHOCARDIOGRAM COMPLETE      Medication Changes: Meds ordered this encounter  Medications   atorvastatin (LIPITOR) 40 MG tablet    Sig: Take 1 tablet (40 mg total) by mouth daily.    Dispense:  90 tablet    Refill:  3   aspirin EC 81 MG tablet    Sig: Take 1 tablet (81 mg total) by mouth daily. Swallow whole.    Dispense:  90 tablet    Refill:  3      History of Present Illness:    FOCUSED CARDIOVASCULAR PROBLEM LIST:   1.  Atypical chest pain with normal ETT 2016 2.  Iron deficiency anemia 3.  GERD 4.   Osteoporosis 5.  Hyperlipidemia with  atherosclerosis seen on CT abdomen pelvis in April 2022   The patient is a 61 y.o. female with the indicated medical history here to reestablish cardiovascular care.  She was last seen in 2015 for atypical chest pain and referred for exercise treadmill stress test which was negative.  She had a CT scan which showed incidental abdominal atherosclerosis.  This concerned her and her husband and this is why they sought cardiac evaluation today.  She denies any chest pain but she did have palpitations several months ago that were self-limited.  She denies any exertional shortness of breath, paroxysmal nocturnal dyspnea, peripheral edema, or severe bleeding or bruising.  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  She and her husband tell me that they do get poor sleep.  She is not sure if this is related to sleep.  She has required no emergency room visits or hospitalizations.  She is otherwise well without significant complaints.       Previous Medical History: Past Medical History:  Diagnosis Date   Adenomatous Robin polyp    Allergy    Asthma 1990's   GERD (gastroesophageal reflux disease)  Headache(784.0)    Palpitations    Pneumonia    Tuberculosis 1990's  Osteoporosis Iron deficiency anemia   Current Medications: Current Meds  Medication Sig   aspirin EC 81 MG tablet Take 1 tablet (81 mg total) by mouth daily. Swallow whole.   atorvastatin (LIPITOR) 40 MG tablet Take 1 tablet (40 mg total) by mouth daily.   calcium-vitamin D 250-100 MG-UNIT tablet Take 1 tablet by mouth in the morning, at noon, and at bedtime.   estradiol (ESTRACE) 0.1 MG/GM vaginal cream    famotidine (PEPCID) 20 MG tablet Take 1 tablet (20 mg total) by mouth daily.   ferrous fumarate (HEMOCYTE - 106 MG FE) 325 (106 Fe) MG TABS tablet Take 1 tablet (106 mg of iron total) by mouth 2 (two) times daily.   gabapentin (NEURONTIN) 100 MG capsule  Take 100 mg by mouth daily.   ibandronate (BONIVA) 150 MG tablet TAKE ONE TABLET BY MOUTH EVERY 30 DAYS   pantoprazole (PROTONIX) 40 MG tablet Take 1 tablet (40 mg total) by mouth 2 (two) times daily before a meal. Pantoprazole 40 mg twice a day for 8 weeks.   vitamin B-12 (CYANOCOBALAMIN) 1000 MCG tablet Take 1,000 mcg by mouth daily.     Allergies:    Clindamycin   Social History:   Social History   Tobacco Use   Smoking status: Never   Smokeless tobacco: Never  Vaping Use   Vaping Use: Never used  Substance Use Topics   Alcohol use: Yes    Comment: occasional wine   Drug use: No     Family Hx: Family History  Problem Relation Age of Onset   Hypertension Mother    High Cholesterol Mother    Atrial fibrillation Father        pacemaker   Other Father        blood clotting disorder   Hypertension Brother    Hyperlipidemia Brother    Diabetes Paternal Grandfather    Heart disease Other    Hypertension Other    Hyperlipidemia Other    Hypertension Other    Robin cancer Neg Hx    Breast cancer Neg Hx    Robin polyps Neg Hx      Review of Systems:   Please see the history of present illness.    All other systems reviewed and are negative.  EKGs/Labs/Other Test Reviewed:    EKG:  EKG today demonstrates sinus rhythm with PACs; prior EKGs demonstrate from 2015 demonstrates sinus rhythm with low voltage  Prior CV studies:  Echocardiogram 2013 with normal LV function with no significant valvular abnormalities  ETT 2016 achieving 11.6 METS  Imaging studies that I have independently reviewed today: Abd CT  Recent Labs: 01/24/2021: ALT 18; BUN 13; Creatinine 0.62; Hemoglobin 11.6; Platelets 245; Potassium 3.6; Sodium 140; TSH 0.893   Lipid panel from August 2022 demonstrates cholesterol 239, triglycerides 164, HDL 67, LDL 139  Recent Lipid Panel Lab Results  Component Value Date/Time   CHOL 239 (H) 10/06/2020 03:46 PM   CHOL 235 (H) 08/15/2019 03:19 PM   TRIG  164.0 (H) 10/06/2020 03:46 PM   HDL 67.20 10/06/2020 03:46 PM   HDL 83 08/15/2019 03:19 PM   LDLCALC 139 (H) 10/06/2020 03:46 PM   LDLCALC 137 (H) 08/15/2019 03:19 PM     Risk Assessment/Calculations:          Physical Exam:    VS:  BP 100/68   Pulse 91   Ht 5' (1.524  m)   Wt 117 lb (53.1 kg)   SpO2 98%   BMI 22.85 kg/m    Wt Readings from Last 3 Encounters:  02/10/21 117 lb (53.1 kg)  01/24/21 118 lb 11.2 oz (53.8 kg)  12/31/20 118 lb 9.6 oz (53.8 kg)    GENERAL:  No apparent distress, AOx3 HEENT:  No carotid bruits, +2 carotid impulses, no scleral icterus CAR: RRR no murmurs, gallops, rubs, or thrills RES:  Clear to auscultation bilaterally ABD:  Soft, nontender, nondistended, positive bowel sounds x 4 VASC:  +2 radial pulses, +2 carotid pulses, palpable pedal pulses NEURO:  CN 2-12 grossly intact; motor and sensory grossly intact PSYCH:  No active depression or anxiety EXT:  No edema, ecchymosis, or cyanosis    Signed, Early Osmond, MD  02/10/2021 9:15 AM    [address], [place], [place]  [address] Phone: [phone]; Fax: [phone]

## 2021-02-09 ENCOUNTER — Inpatient Hospital Stay: Payer: 59 | Attending: Hematology | Admitting: Hematology

## 2021-02-09 DIAGNOSIS — Z79899 Other long term (current) drug therapy: Secondary | ICD-10-CM | POA: Insufficient documentation

## 2021-02-09 DIAGNOSIS — D509 Iron deficiency anemia, unspecified: Secondary | ICD-10-CM | POA: Diagnosis not present

## 2021-02-09 DIAGNOSIS — E538 Deficiency of other specified B group vitamins: Secondary | ICD-10-CM | POA: Insufficient documentation

## 2021-02-10 ENCOUNTER — Encounter: Payer: Self-pay | Admitting: Internal Medicine

## 2021-02-10 ENCOUNTER — Ambulatory Visit (INDEPENDENT_AMBULATORY_CARE_PROVIDER_SITE_OTHER): Payer: 59 | Admitting: Internal Medicine

## 2021-02-10 ENCOUNTER — Ambulatory Visit (INDEPENDENT_AMBULATORY_CARE_PROVIDER_SITE_OTHER): Payer: 59

## 2021-02-10 ENCOUNTER — Other Ambulatory Visit: Payer: Self-pay

## 2021-02-10 ENCOUNTER — Telehealth: Payer: Self-pay | Admitting: Hematology

## 2021-02-10 ENCOUNTER — Other Ambulatory Visit: Payer: 59

## 2021-02-10 VITALS — BP 100/68 | HR 91 | Ht 60.0 in | Wt 117.0 lb

## 2021-02-10 DIAGNOSIS — E785 Hyperlipidemia, unspecified: Secondary | ICD-10-CM

## 2021-02-10 DIAGNOSIS — R42 Dizziness and giddiness: Secondary | ICD-10-CM

## 2021-02-10 DIAGNOSIS — I700 Atherosclerosis of aorta: Secondary | ICD-10-CM

## 2021-02-10 MED ORDER — ASPIRIN EC 81 MG PO TBEC: 81.0000 mg | DELAYED_RELEASE_TABLET | Freq: Every day | ORAL | 3 refills | Status: DC

## 2021-02-10 MED ORDER — ATORVASTATIN CALCIUM 40 MG PO TABS: 40.0000 mg | ORAL_TABLET | Freq: Every day | ORAL | 3 refills | Status: DC

## 2021-02-10 NOTE — Progress Notes (Unsigned)
Enrolled for [employer] to mail a ZIO XT long term holter monitor to the patients address on file.  

## 2021-02-10 NOTE — Telephone Encounter (Signed)
Left message with follow-up appointments per 1/27 los. °

## 2021-02-10 NOTE — Patient Instructions (Addendum)
Medication Instructions:  Your physician has recommended you make the following change in your medication:  1) START taking Lipitor (atorvastatin) 40 mg daily  2) START taking Aspirin 81 mg daily   *If you need a refill on your cardiac medications before your next appointment, please call your pharmacy*  Lab Work: IN THREE MONTHS: Fasting lipids, CBC, and CMET If you have labs (blood work) drawn today and your tests are completely normal, you will receive your results only by: [place] (if you have MyChart) OR A paper copy in the mail If you have any lab test that is abnormal or we need to change your treatment, we will call you to review the results.  Testing/Procedures: Your physician has requested that you have an echocardiogram. Echocardiography is a painless test that uses sound waves to create images of your heart. It provides your doctor with information about the size and shape of your heart and how well your heart's chambers and valves are working. This procedure takes approximately one hour. There are no restrictions for this procedure.  Your physician has recommended that you wear an event monitor. Event monitors are medical devices that record the heart's electrical activity. Doctors most often [place] these monitors to diagnose arrhythmias. Arrhythmias are problems with the speed or rhythm of the heartbeat. The monitor is a small, portable device. You can wear one while you do your normal daily activities. This is usually used to diagnose what is causing palpitations/syncope (passing out).  Follow-Up: At [facility], you and your health needs are our priority.  As part of our continuing mission to provide you with exceptional heart care, we have created designated Provider Care Teams.  These Care Teams include your primary Cardiologist (physician) and Advanced Practice Providers (APPs -  Physician Assistants and Nurse Practitioners) who all work together to provide you with the  care you need, when you need it.  Your next appointment:   6 month(s)  The format for your next appointment:   In Person  Provider:   Early Osmond, MD    Other Instructions  Bryn Gulling- Long Term Monitor Instructions  Your physician has requested you wear a ZIO patch monitor for 3 days.  This is a single patch monitor. [employer] supplies one patch monitor per enrollment. Additional stickers are not available. Please do not apply patch if you will be having a Nuclear Stress Test,  Echocardiogram, Cardiac CT, MRI, or Chest Xray during the period you would be wearing the  monitor. The patch cannot be worn during these tests. You cannot remove and re-apply the  ZIO XT patch monitor.  Your ZIO patch monitor will be mailed 3 day USPS to your address on file. It may take 3-5 days  to receive your monitor after you have been enrolled.  Once you have received your monitor, please review the enclosed instructions. Your monitor  has already been registered assigning a specific monitor serial # to you.  Billing and Patient Assistance Program Information  We have supplied [employer] with any of your insurance information on file for billing purposes. [employer] offers a sliding scale Patient Assistance Program for patients that do not have  insurance, or whose insurance does not completely cover the cost of the ZIO monitor.  You must apply for the Patient Assistance Program to qualify for this discounted rate.  To apply, please call [employer] at [phone], select option 4, select option 2, ask to apply for  Patient Assistance Program. Theodore Demark will ask  your household income, and how many people  are in your household. They will quote your out-of-pocket cost based on that information.  [employer] will also be able to set up a 11-month, interest-free payment plan if needed.  Applying the monitor   Shave hair from upper left chest.  Hold abrader disc by orange tab. Rub abrader in 40 strokes over the upper  left chest as  indicated in your monitor instructions.  Clean area with 4 enclosed alcohol pads. Let dry.  Apply patch as indicated in monitor instructions. Patch will be placed under collarbone on left  side of chest with arrow pointing upward.  Rub patch adhesive wings for 2 minutes. Remove white label marked "1". Remove the white  label marked "2". Rub patch adhesive wings for 2 additional minutes.  While looking in a mirror, press and release button in center of patch. A small green light will  flash 3-4 times. This will be your only indicator that the monitor has been turned on.  Do not shower for the first 24 hours. You may shower after the first 24 hours.  Press the button if you feel a symptom. You will hear a small click. Record Date, Time and  Symptom in the Patient Logbook.  When you are ready to remove the patch, follow instructions on the last 2 pages of Patient  Logbook. Stick patch monitor onto the last page of Patient Logbook.  Place Patient Logbook in the blue and white box. Use locking tab on box and tape box closed  securely. The blue and white box has prepaid postage on it. Please place it in the mailbox as  soon as possible. Your physician should have your test results approximately 7 days after the  monitor has been mailed back to [facility].  Call [place] at [phone] if you have questions regarding  your ZIO XT patch monitor. Call them immediately if you see an orange light blinking on your  monitor.  If your monitor falls off in less than 4 days, contact our Monitor department at [phone].  If your monitor becomes loose or falls off after 4 days call [employer] at [phone] for  suggestions on securing your monitor

## 2021-02-11 ENCOUNTER — Other Ambulatory Visit: Payer: 59

## 2021-02-11 ENCOUNTER — Encounter: Payer: Self-pay | Admitting: Internal Medicine

## 2021-02-12 ENCOUNTER — Ambulatory Visit
Admission: RE | Admit: 2021-02-12 | Discharge: 2021-02-12 | Disposition: A | Discharge status: Home / Self Care | Payer: 59 | Source: Ambulatory Visit | Attending: Orthopedic Surgery | Admitting: Orthopedic Surgery

## 2021-02-12 ENCOUNTER — Other Ambulatory Visit: Payer: Self-pay

## 2021-02-12 DIAGNOSIS — M50122 Cervical disc disorder at C5-C6 level with radiculopathy: Secondary | ICD-10-CM

## 2021-02-12 DIAGNOSIS — M4722 Other spondylosis with radiculopathy, cervical region: Secondary | ICD-10-CM

## 2021-02-12 DIAGNOSIS — M5412 Radiculopathy, cervical region: Secondary | ICD-10-CM

## 2021-02-14 ENCOUNTER — Other Ambulatory Visit: Payer: Self-pay

## 2021-02-14 ENCOUNTER — Encounter: Payer: Self-pay | Admitting: Internal Medicine

## 2021-02-14 ENCOUNTER — Ambulatory Visit (HOSPITAL_COMMUNITY): Payer: 59 | Attending: Internal Medicine

## 2021-02-14 DIAGNOSIS — R42 Dizziness and giddiness: Secondary | ICD-10-CM

## 2021-02-14 DIAGNOSIS — I700 Atherosclerosis of aorta: Secondary | ICD-10-CM | POA: Diagnosis not present

## 2021-02-14 DIAGNOSIS — E785 Hyperlipidemia, unspecified: Secondary | ICD-10-CM | POA: Diagnosis not present

## 2021-02-14 LAB — ECHOCARDIOGRAM COMPLETE
Area-P 1/2: 2.99 cm2
S' Lateral: 3.2 cm

## 2021-02-14 NOTE — Telephone Encounter (Signed)
Okay to place order for calcium score ct scan per Dr. Ali Lowe.

## 2021-02-15 DIAGNOSIS — L649 Androgenic alopecia, unspecified: Secondary | ICD-10-CM | POA: Insufficient documentation

## 2021-02-15 NOTE — Progress Notes (Addendum)
Marland Kitchen   HEMATOLOGY/ONCOLOGY PHONE VISIT NOTE  Date of Service: .02/09/2021   Patient Care Team: Colon Branch, MD as PCP - General (Internal Medicine) Early Osmond, MD as PCP - Cardiology (Cardiology) Vanessa Kick, MD as Consulting Physician (Obstetrics and Gynecology)  CHIEF COMPLAINTS/PURPOSE OF CONSULTATION:  Follow-up for discussion of lab results of anemia  HISTORY OF PRESENTING ILLNESS:  [name] is a wonderful 60 y.o. female who has been referred to [place] by Dr Thornton Park MD for evaluation and management of anemia.  Patient has a history of GERD, asthma, nasal allergies, remote history of tuberculosis status posttreatment who was on labs with her primary care physician on 10/06/2020 was noted to have Mild anemia with a hemoglobin of 11.9, MCV of 91 with normal WBC count of 5.4k and normal platelets of 223k. Patient was noted to have iron deficiency with a ferritin of 9.9 and also noted to have B12 deficiency with a B12 level of 174.  Folate was 19.2. His fecal occult blood testing was negative.  She was referred to Dr. Thornton Park for a gastroenterology evaluation and had an EGD on 11/15/2020 which showed erosive gastropathy with no bleeding or stigmata of recent bleeding. Biopsied.  Pathology did not show any Helicobacter pylori. She has been placed on pantoprazole by Dr. Tarri Glenn.  Patient notes that she has previously had a colonoscopy in October 2021 which showed an 8 mm polyp in the cecum which was resected.  No other evidence of GI bleeding.  She had lab testing which ruled out celiac disease.  She was referred to [place] for further evaluation and management of her iron deficiency. Patient notes that she has been on ferrous fumarate 1 tablet p.o. daily by Dr. Webb Laws since October 18, 2020.  Patient notes no significant abdominal discomfort at this time.  No sudden unexpected weight loss. No fevers no chills no night sweats no new lumps or bumps.  Patient denies any  family history of blood disorders. Notes that she has been having tingling and discomfort in both her hands and wonders if she has any disc problems in her neck.  She does have an MRI of the C-spine pending and has been scheduled on 02/03/2021.   INTERVAL HISTORY  .I connected with [name] on 02/16/21 at  3:20 PM EST by telephone visit and verified that I am speaking with the correct person using two identifiers.   I discussed the limitations, risks, security and privacy concerns of performing an evaluation and management service by telemedicine and the availability of in-person appointments. I also discussed with the patient that there may be a patient responsible charge related to this service. The patient expressed understanding and agreed to proceed.   Other persons participating in the visit and their role in the encounter: patients Husband   Patients location: Home   Providers location: [place]  Chief Complaint: Lab review for anemia.  Patient was called to discuss her lab results for work-up of anemia.  She notes no acute new symptoms since her last clinic visit.  Lab results were discussed in details as noted below and summarized in the assessment and plan.   MEDICAL HISTORY:  Past Medical History:  Diagnosis Date   Adenomatous colon polyp    Allergy    Asthma 1990's   GERD (gastroesophageal reflux disease)    Headache(784.0)    Palpitations    Pneumonia    Tuberculosis 1990's    SURGICAL HISTORY: Past  Surgical History:  Procedure Laterality Date   APPENDECTOMY     COLONOSCOPY  08/31/2009   COLONOSCOPY  12/12/2019   COLPOSCOPY  04/28/2016   LAPAROSCOPIC APPENDECTOMY N/A 06/18/2020   Procedure: APPENDECTOMY LAPAROSCOPIC;  Surgeon: Jesusita Oka, MD;  Location: [facility] OR;  Service: General;  Laterality: N/A;   OVARIAN CYST REMOVAL      SOCIAL HISTORY: Social History   Socioeconomic History   Marital status: Married    Spouse name: Not  on file   Number of children: 0   Years of education: Not on file   Highest education level: Not on file  Occupational History   Occupation: Scientist, water quality [employer]  Tobacco Use   Smoking status: Never   Smokeless tobacco: Never  Vaping Use   Vaping Use: Never used  Substance and Sexual Activity   Alcohol use: Yes    Comment: occasional wine   Drug use: No   Sexual activity: Not on file  Other Topics Concern   Not on file  Social History Narrative   From [place] Determinants of Health   Financial Resource Strain: Not on file  Food Insecurity: Not on file  Transportation Needs: Not on file  Physical Activity: Not on file  Stress: Not on file  Social Connections: Not on file  Intimate Partner Violence: Not on file    FAMILY HISTORY: Family History  Problem Relation Age of Onset   Hypertension Mother    High Cholesterol Mother    Atrial fibrillation Father        pacemaker   Other Father        blood clotting disorder   Hypertension Brother    Hyperlipidemia Brother    Diabetes Paternal Grandfather    Heart disease Other    Hypertension Other    Hyperlipidemia Other    Hypertension Other    Colon cancer Neg Hx    Breast cancer Neg Hx    Colon polyps Neg Hx     ALLERGIES:  is allergic to clindamycin.  MEDICATIONS:  Current Outpatient Medications  Medication Sig Dispense Refill   aspirin EC 81 MG tablet Take 1 tablet (81 mg total) by mouth daily. Swallow whole. 90 tablet 3   atorvastatin (LIPITOR) 40 MG tablet Take 1 tablet (40 mg total) by mouth daily. 90 tablet 3   calcium-vitamin D 250-100 MG-UNIT tablet Take 1 tablet by mouth in the morning, at noon, and at bedtime.     estradiol (ESTRACE) 0.1 MG/GM vaginal cream      famotidine (PEPCID) 20 MG tablet Take 1 tablet (20 mg total) by mouth daily. 30 tablet 11   ferrous fumarate (HEMOCYTE - 106 MG FE) 325 (106 Fe) MG TABS tablet Take 1 tablet (106 mg of iron total) by mouth 2 (two) times daily. 60  tablet 5   gabapentin (NEURONTIN) 100 MG capsule Take 100 mg by mouth daily.     ibandronate (BONIVA) 150 MG tablet TAKE ONE TABLET BY MOUTH EVERY 30 DAYS 3 tablet 3   pantoprazole (PROTONIX) 40 MG tablet Take 1 tablet (40 mg total) by mouth 2 (two) times daily before a meal. Pantoprazole 40 mg twice a day for 8 weeks. 60 tablet 2   vitamin B-12 (CYANOCOBALAMIN) 1000 MCG tablet Take 1,000 mcg by mouth daily.     No current facility-administered medications for this visit.    REVIEW OF SYSTEMS:    Still with some grade 1 fatigue.  No lightheadedness  or dizziness.   PHYSICAL EXAMINATION: Telemedicine visit  LABORATORY DATA:  I have reviewed the data as listed  . CBC Latest Ref Rng & Units 01/24/2021 01/24/2021 12/31/2020  WBC 4.0 - 10.5 K/uL 5.6 - -  Hemoglobin 12.0 - 15.0 g/dL 11.6(L) - 12.7  Hematocrit 34.0 - 46.6 % 36.5 35.8(L) -  Platelets 150 - 400 K/uL 245 - -   .CBC    Component Value Date/Time   WBC 5.6 01/24/2021 1427   RBC 3.86 (L) 01/24/2021 1427   HGB 11.6 (L) 01/24/2021 1427   HGB 13.2 08/15/2019 1519   HCT 36.5 01/24/2021 1428   HCT 35.8 (L) 01/24/2021 1427   PLT 245 01/24/2021 1427   PLT 254 08/15/2019 1519   MCV 92.7 01/24/2021 1427   MCV 93 08/15/2019 1519   MCH 30.1 01/24/2021 1427   MCHC 32.4 01/24/2021 1427   RDW 13.2 01/24/2021 1427   RDW 12.9 08/15/2019 1519   LYMPHSABS 1.8 01/24/2021 1427   LYMPHSABS 2.6 08/15/2019 1519   MONOABS 0.3 01/24/2021 1427   EOSABS 0.3 01/24/2021 1427   EOSABS 0.7 (H) 08/15/2019 1519   BASOSABS 0.1 01/24/2021 1427   BASOSABS 0.1 08/15/2019 1519    . CMP Latest Ref Rng & Units 01/24/2021 10/06/2020 09/17/2020  Glucose 70 - 99 mg/dL 88 86 -  BUN 6 - 20 mg/dL 13 10 11   Creatinine 0.44 - 1.00 mg/dL 0.62 0.55 0.53  Sodium 135 - 145 mmol/L 140 138 -  Potassium 3.5 - 5.1 mmol/L 3.6 4.0 -  Chloride 98 - 111 mmol/L 108 104 -  CO2 22 - 32 mmol/L 25 27 -  Calcium 8.9 - 10.3 mg/dL 8.4(L) 9.0 -  Total Protein 6.5 - 8.1  g/dL 6.8 6.7 -  Total Bilirubin 0.3 - 1.2 mg/dL 0.3 0.6 -  Alkaline Phos 38 - 126 U/L 68 56 -  AST 15 - 41 U/L 18 15 -  ALT 0 - 44 U/L 18 14 -    . Lab Results  Component Value Date   IRON 58 01/24/2021   TIBC 309 01/24/2021   IRONPCTSAT 19 (L) 01/24/2021   (Iron and TIBC)  Lab Results  Component Value Date   FERRITIN 23 01/24/2021       Component     Latest Ref Rng & Units 01/24/2021  Copper     80 - 158 ug/dL 111  Vitamin D, 25-Hydroxy     30 - 100 ng/mL 30.97  TSH     0.308 - 3.960 uIU/mL 0.893  Intrinsic Factor     0.0 - 1.1 AU/mL 1.0  Parietal Cell Antibody-IgG     0.0 - 20.0 Units 6.8  Vitamin B12     180 - 914 pg/mL 289     EGD 11/15/2020: - The examined esophagus was normal. Biopsies were obtained from the mid/proximal and distal esophagus with cold forceps for histology of suspected eosinophilic esophagitis. All of these biopsies were placed in one specimen container. Findings: - Multiple small erosions with no bleeding and no stigmata of recent bleeding were found in the gastric antrum. Biopsies were taken from the antrum, body, and fundus with a cold forceps for histology. Estimated blood loss was minimal. - The examined duodenum was normal. Biopsies were taken with a cold forceps for histology  Colonoscopy 12/12/2019- The perianal and digital rectal examinations were normal. - A 8 mm polyp was found in the cecum. The polyp was sessile. The polyp was removed with a cold snare. Resection  and retrieval were complete. - Scattered small-mouthed diverticula were found in the entire colon. There was no evidence of diverticular bleeding. - The exam was otherwise without abnormality on direct and retroflexion views.  RADIOGRAPHIC STUDIES: I have personally reviewed the radiological images as listed and agreed with the findings in the report. MR CERVICAL SPINE WO CONTRAST  Result Date: 02/13/2021 CLINICAL DATA:  Posterior neck pain extending to the right  shoulder. Numbness and tingling in the right middle and index fingers. EXAM: MRI CERVICAL SPINE WITHOUT CONTRAST TECHNIQUE: Multiplanar, multisequence MR imaging of the cervical spine was performed. No intravenous contrast was administered. COMPARISON:  MRI of the cervical spine 12/20/2012. FINDINGS: Alignment: Slight anterolisthesis is present at C4-5. No other significant listhesis is present. Straightening of the cervical lordosis is present. Vertebrae: Marrow signal and vertebral body heights more normal. Cord: Normal signal and morphology. Posterior Fossa, vertebral arteries, paraspinal tissues: Craniocervical junction is normal. Flow is present in the vertebral arteries bilaterally. Visualized intracranial contents are normal. Disc levels: C2-3: Negative. C3-4: Negative. C4-5: Minimal uncovering of the disc is stable. No significant stenosis is present. C5-6: Minimal soft disc bulging is present without significant stenosis. C6-7: Negative. C7-T1: Negative. IMPRESSION: 1. Stable minimal disc bulging at C4-5 and C5-6 without significant stenosis. 2. No acute or focal lesion to explain right-sided radicular symptoms. Electronically Signed   By: San Morelle M.D.   On: 02/13/2021 07:41   ECHOCARDIOGRAM COMPLETE  Result Date: 02/14/2021    ECHOCARDIOGRAM REPORT   Patient Name:   [name] Date of Exam: 02/14/2021 Medical Rec #:  [id number]       Height:       60.0 in Accession #:    [id number]      Weight:       117.0 lb Date of Birth:  Nov 29, 1960       BSA:          1.486 m Patient Age:    56 years        BP:           93/68 mmHg Patient Gender: F               HR:           68 bpm. Exam Location:  [facility] Procedure: 2D Echo, 3D Echo, Cardiac Doppler and Color Doppler Indications:    I70. Aortic Atheroscleorisis  History:        Patient has no prior history of Echocardiogram examinations.                 Signs/Symptoms:Dizziness/Lightheadedness; Risk Factors:HLD.  Sonographer:    Marygrace Drought RCS Referring Phys: [id number] [place]  1. Left ventricular ejection fraction, by estimation, is 65 to 70%. Left ventricular ejection fraction by 3D volume is 67 %. The left ventricle has normal function. The left ventricle has no regional wall motion abnormalities. Left ventricular diastolic  parameters were normal.  2. Right ventricular systolic function is normal. The right ventricular size is normal. Tricuspid regurgitation signal is inadequate for assessing PA pressure.  3. The mitral valve is normal in structure. Trivial mitral valve regurgitation.  4. The aortic valve is tricuspid. There is mild thickening of the aortic valve. Aortic valve regurgitation is not visualized. Aortic valve sclerosis is present, with no evidence of aortic valve stenosis.  5. The inferior vena cava is normal in size with greater than 50% respiratory variability, suggesting right atrial pressure of 3 mmHg. Comparison(s):  No prior Echocardiogram. FINDINGS  Left Ventricle: Left ventricular ejection fraction, by estimation, is 65 to 70%. Left ventricular ejection fraction by 3D volume is 67 %. The left ventricle has normal function. The left ventricle has no regional wall motion abnormalities. The left ventricular internal cavity size was normal in size. There is no left ventricular hypertrophy. Left ventricular diastolic parameters were normal. Right Ventricle: The right ventricular size is normal. No increase in right ventricular wall thickness. Right ventricular systolic function is normal. Tricuspid regurgitation signal is inadequate for assessing PA pressure. Left Atrium: Left atrial size was normal in size. Right Atrium: Right atrial size was normal in size. Pericardium: There is no evidence of pericardial effusion. Mitral Valve: The mitral valve is normal in structure. There is mild thickening of the mitral valve leaflet(s). Trivial mitral valve regurgitation. Tricuspid Valve: The tricuspid valve is normal  in structure. Tricuspid valve regurgitation is trivial. Aortic Valve: The aortic valve is tricuspid. There is mild thickening of the aortic valve. Aortic valve regurgitation is not visualized. Aortic valve sclerosis is present, with no evidence of aortic valve stenosis. Pulmonic Valve: The pulmonic valve was normal in structure. Pulmonic valve regurgitation is trivial. Aorta: The aortic root is normal in size and structure. Venous: The inferior vena cava is normal in size with greater than 50% respiratory variability, suggesting right atrial pressure of 3 mmHg. IAS/Shunts: No atrial level shunt detected by color flow Doppler.  LEFT VENTRICLE PLAX 2D LVIDd:         4.70 cm         Diastology LVIDs:         3.20 cm         LV e' medial:    13.90 cm/s LV PW:         0.60 cm         LV E/e' medial:  5.5 LV IVS:        0.70 cm         LV e' lateral:   15.10 cm/s LVOT diam:     2.00 cm         LV E/e' lateral: 5.1 LV SV:         64 LV SV Index:   43 LVOT Area:     3.14 cm        3D Volume EF                                LV 3D EF:    Left                                             ventricul                                             ar                                             ejection  fraction                                             by 3D                                             volume is                                             67 %.                                 3D Volume EF:                                3D EF:        67 %                                LV EDV:       93 ml                                LV ESV:       31 ml                                LV SV:        62 ml RIGHT VENTRICLE RV Basal diam:  3.10 cm RV S prime:     12.20 cm/s TAPSE (M-mode): 2.4 cm LEFT ATRIUM           Index        RIGHT ATRIUM           Index LA diam:      3.10 cm 2.09 cm/m   RA Area:     11.70 cm LA Vol (A2C): 31.8 ml 21.40 ml/m  RA Volume:   24.70 ml  16.62 ml/m LA Vol  (A4C): 15.7 ml 10.56 ml/m  AORTIC VALVE LVOT Vmax:   90.20 cm/s LVOT Vmean:  67.000 cm/s LVOT VTI:    0.205 m  AORTA Ao Root diam: 3.00 cm Ao Asc diam:  3.30 cm MITRAL VALVE MV Area (PHT):             SHUNTS MV Decel Time:             Systemic VTI:  0.20 m MV E velocity: 77.00 cm/s  Systemic Diam: 2.00 cm MV A velocity: 54.50 cm/s MV E/A ratio:  1.41 Gwyndolyn Kaufman MD Electronically signed by Gwyndolyn Kaufman MD Signature Date/Time: 02/14/2021/3:31:43 PM    Final     ASSESSMENT & PLAN:   60 year old with  #1 Iron deficiency anemia Patient's anemia is mild hemoglobin is 11.6. Patient has had GI work-up EGD showed erosive gastropathy and patient is currently on PPI.  Helicobacter pylori negative. Colonoscopy in August 2021 showed tubular adenoma presenting as  a cecal polyp 8 mm.  This has been resected. Recent fecal occult blood testing in August was negative. In the presence of other associated B12 and vitamin D deficiency cannot rule out an absorption issue. Serology for celiac disease negative Will rule out pernicious anemia Patient has been on oral iron since August 2022 PLAN -I discussed with the patient that her anemia is fairly mild with a hemoglobin of 11.6 MCV of 92.7. Her ferritin level is gradually increasing with her oral iron that she has been on since August 2022. We discussed that in the absence of significant active bleeding at this time we would do reasonable trial of oral iron especially since it appears that she is at least absorbing some of this and her ferritin levels are improving. -She was recommended to start iron polysaccharide 150 mg p.o. daily with meals and preferably with orange juice.  She was recommended not taking this with milk or milk products since this could reduce the absorption of iron. -If the patient does not tolerate oral iron or if her iron levels do not normalize we could consider IV iron in the future however at this time this is not definitively  indicated. -No evidence of pernicious anemia on labs.  #2 B12 deficiency  No dietary restrictions.?  Absorption issue. PLAN -No evidence of pernicious anemia on labs -B12 levels gradually improving. -Recommended patient take sublingual B12 1 to 2000 mcg sublingual daily. -No clear indication to switch to parenteral iron administration at this time.  #3 vitamin D deficiency -Follow-up and management of vitamin D deficiency per primary care physician.  #4 fatigue -We discussed optimizing iron and B12 replacement. -Optimize vitamin D replacement per primary care physician. -TSH and copper levels within normal limits  FOLLOWUP Phone visit with Dr Irene Limbo in 4 months Labs 1 week prior to clinic visit.    All of the patients questions were answered with apparent satisfaction. The patient knows to call the clinic with any problems, questions or concerns.    Sullivan Lone MD MS AAHIVMS [facility] Hematology/Oncology Physician [facility] .

## 2021-02-16 ENCOUNTER — Ambulatory Visit: Payer: 59 | Admitting: Internal Medicine

## 2021-02-22 ENCOUNTER — Encounter: Payer: Self-pay | Admitting: Pulmonary Disease

## 2021-02-22 NOTE — Telephone Encounter (Signed)
RA please advise. Thanks   Hey Dr. Elsworth Soho, I hope you've been doing alright. I had to have a CAC , Coronary Artery Calcium, CT scan last week and under the [facility] findings of the report,  it lists "Bilateral Calcified Granulomas."  I don't recall these findings on a CT scan that I had to have on 06/18/2020  when I had to have an appendectomy nor do I recall such findings  on  2 MRIs that I've had this year.  And,  there was no mention of it on an endoscopy that I had  this year either. Perhaps these findings only appear on a CAC CT scan but, nevertheless,  I was just wondering if you feel that they warrant my scheduling an appointment with you to further discuss them and how they may/may not  impact my health.  I had Tuberculosis many years ago when I lived in [place], [place] and don't know if there is a relationship between it and the bilateral calcified granulomas.  I just want to make sure that I'm not overlooking any potential health risk with this CAC finding.  I will be glad to schedule an  appointment with you if you think that I need to come in and discuss this issue with you further.  Thank you so much for assistance Dr. Elsworth Soho

## 2021-02-23 ENCOUNTER — Other Ambulatory Visit (HOSPITAL_BASED_OUTPATIENT_CLINIC_OR_DEPARTMENT_OTHER): Payer: 59

## 2021-02-25 ENCOUNTER — Other Ambulatory Visit (HOSPITAL_COMMUNITY): Payer: 59

## 2021-03-17 ENCOUNTER — Inpatient Hospital Stay: Admission: RE | Admit: 2021-03-17 | Payer: 59 | Source: Ambulatory Visit

## 2021-04-18 ENCOUNTER — Other Ambulatory Visit: Payer: Self-pay | Admitting: Gastroenterology

## 2021-05-06 ENCOUNTER — Telehealth (HOSPITAL_COMMUNITY): Payer: Self-pay | Admitting: Radiology

## 2021-05-06 NOTE — Telephone Encounter (Signed)
Left message on answering machine-Disc of echocardiogram is completed. It will be located at the front desk. The office will be open until 530 pm.   ?Outgoing call   ? ?

## 2021-05-13 ENCOUNTER — Other Ambulatory Visit: Payer: Self-pay

## 2021-05-13 ENCOUNTER — Other Ambulatory Visit: Payer: 59 | Admitting: *Deleted

## 2021-05-13 DIAGNOSIS — E785 Hyperlipidemia, unspecified: Secondary | ICD-10-CM

## 2021-05-13 DIAGNOSIS — R42 Dizziness and giddiness: Secondary | ICD-10-CM

## 2021-05-13 DIAGNOSIS — I700 Atherosclerosis of aorta: Secondary | ICD-10-CM

## 2021-05-13 LAB — COMPREHENSIVE METABOLIC PANEL
ALT: 15 IU/L (ref 0–32)
AST: 17 IU/L (ref 0–40)
Albumin/Globulin Ratio: 1.7 (ref 1.2–2.2)
Albumin: 4.3 g/dL (ref 3.8–4.8)
Alkaline Phosphatase: 72 IU/L (ref 44–121)
BUN/Creatinine Ratio: 31 — ABNORMAL HIGH (ref 12–28)
BUN: 17 mg/dL (ref 8–27)
Bilirubin Total: 0.3 mg/dL (ref 0.0–1.2)
CO2: 24 mmol/L (ref 20–29)
Calcium: 8.8 mg/dL (ref 8.7–10.3)
Chloride: 105 mmol/L (ref 96–106)
Creatinine, Ser: 0.54 mg/dL — ABNORMAL LOW (ref 0.57–1.00)
Globulin, Total: 2.6 g/dL (ref 1.5–4.5)
Glucose: 89 mg/dL (ref 70–99)
Potassium: 4 mmol/L (ref 3.5–5.2)
Sodium: 140 mmol/L (ref 134–144)
Total Protein: 6.9 g/dL (ref 6.0–8.5)
eGFR: 105 mL/min/{1.73_m2} (ref >59)

## 2021-05-13 LAB — CBC
Hematocrit: 40.5 % (ref 34.0–46.6)
Hemoglobin: 13.6 g/dL (ref 11.1–15.9)
MCH: 31.2 pg (ref 26.6–33.0)
MCHC: 33.6 g/dL (ref 31.5–35.7)
MCV: 93 fL (ref 79–97)
Platelets: 244 10*3/uL (ref 150–450)
RBC: 4.36 x10E6/uL (ref 3.77–5.28)
RDW: 12.9 % (ref 11.7–15.4)
WBC: 6.3 10*3/uL (ref 3.4–10.8)

## 2021-05-13 LAB — LIPID PANEL
Chol/HDL Ratio: 2.8 ratio (ref 0.0–4.4)
Cholesterol, Total: 208 mg/dL — ABNORMAL HIGH (ref 100–199)
HDL: 75 mg/dL (ref >39)
LDL Chol Calc (NIH): 111 mg/dL — ABNORMAL HIGH (ref 0–99)
Triglycerides: 124 mg/dL (ref 0–149)
VLDL Cholesterol Cal: 22 mg/dL (ref 5–40)

## 2021-05-19 ENCOUNTER — Encounter: Payer: Self-pay | Admitting: Hematology

## 2021-05-20 ENCOUNTER — Other Ambulatory Visit: Payer: Self-pay

## 2021-05-20 DIAGNOSIS — D509 Iron deficiency anemia, unspecified: Secondary | ICD-10-CM

## 2021-05-31 ENCOUNTER — Inpatient Hospital Stay: Payer: 59 | Attending: Hematology

## 2021-05-31 ENCOUNTER — Other Ambulatory Visit: Payer: Self-pay

## 2021-05-31 DIAGNOSIS — D509 Iron deficiency anemia, unspecified: Secondary | ICD-10-CM | POA: Insufficient documentation

## 2021-05-31 DIAGNOSIS — R002 Palpitations: Secondary | ICD-10-CM | POA: Insufficient documentation

## 2021-05-31 DIAGNOSIS — Z83438 Family history of other disorder of lipoprotein metabolism and other lipidemia: Secondary | ICD-10-CM | POA: Diagnosis not present

## 2021-05-31 LAB — LIPID PANEL
Cholesterol: 228 mg/dL — ABNORMAL HIGH (ref 0–200)
HDL: 93 mg/dL (ref >40)
LDL Cholesterol: 128 mg/dL — ABNORMAL HIGH (ref 0–99)
Total CHOL/HDL Ratio: 2.5 RATIO
Triglycerides: 34 mg/dL (ref <150)
VLDL: 7 mg/dL (ref 0–40)

## 2021-06-02 ENCOUNTER — Other Ambulatory Visit: Payer: Self-pay

## 2021-06-02 DIAGNOSIS — D509 Iron deficiency anemia, unspecified: Secondary | ICD-10-CM

## 2021-06-06 ENCOUNTER — Inpatient Hospital Stay: Payer: 59 | Attending: Hematology

## 2021-06-06 ENCOUNTER — Other Ambulatory Visit: Payer: Self-pay

## 2021-06-06 DIAGNOSIS — D509 Iron deficiency anemia, unspecified: Secondary | ICD-10-CM | POA: Diagnosis present

## 2021-06-06 DIAGNOSIS — E538 Deficiency of other specified B group vitamins: Secondary | ICD-10-CM | POA: Diagnosis not present

## 2021-06-06 LAB — IRON AND IRON BINDING CAPACITY (CC-WL,HP ONLY)
Iron: 101 ug/dL (ref 28–170)
Saturation Ratios: 32 % — ABNORMAL HIGH (ref 10.4–31.8)
TIBC: 314 ug/dL (ref 250–450)
UIBC: 213 ug/dL (ref 148–442)

## 2021-06-06 LAB — CMP (CANCER CENTER ONLY)
ALT: 13 U/L (ref 0–44)
AST: 14 U/L — ABNORMAL LOW (ref 15–41)
Albumin: 4 g/dL (ref 3.5–5.0)
Alkaline Phosphatase: 64 U/L (ref 38–126)
Anion gap: 5 (ref 5–15)
BUN: 20 mg/dL (ref 8–23)
CO2: 27 mmol/L (ref 22–32)
Calcium: 8.7 mg/dL — ABNORMAL LOW (ref 8.9–10.3)
Chloride: 110 mmol/L (ref 98–111)
Creatinine: 0.54 mg/dL (ref 0.44–1.00)
GFR, Estimated: >60 mL/min (ref >60)
Glucose, Bld: 94 mg/dL (ref 70–99)
Potassium: 4 mmol/L (ref 3.5–5.1)
Sodium: 142 mmol/L (ref 135–145)
Total Bilirubin: 0.4 mg/dL (ref 0.3–1.2)
Total Protein: 6.9 g/dL (ref 6.5–8.1)

## 2021-06-06 LAB — CBC WITH DIFFERENTIAL (CANCER CENTER ONLY)
Abs Immature Granulocytes: 0.01 10*3/uL (ref 0.00–0.07)
Basophils Absolute: 0.1 10*3/uL (ref 0.0–0.1)
Basophils Relative: 1 %
Eosinophils Absolute: 0.4 10*3/uL (ref 0.0–0.5)
Eosinophils Relative: 8 %
HCT: 39.3 % (ref 36.0–46.0)
Hemoglobin: 12.6 g/dL (ref 12.0–15.0)
Immature Granulocytes: 0 %
Lymphocytes Relative: 34 %
Lymphs Abs: 1.8 10*3/uL (ref 0.7–4.0)
MCH: 30.4 pg (ref 26.0–34.0)
MCHC: 32.1 g/dL (ref 30.0–36.0)
MCV: 94.9 fL (ref 80.0–100.0)
Monocytes Absolute: 0.4 10*3/uL (ref 0.1–1.0)
Monocytes Relative: 7 %
Neutro Abs: 2.6 10*3/uL (ref 1.7–7.7)
Neutrophils Relative %: 50 %
Platelet Count: 243 10*3/uL (ref 150–400)
RBC: 4.14 MIL/uL (ref 3.87–5.11)
RDW: 13 % (ref 11.5–15.5)
WBC Count: 5.3 10*3/uL (ref 4.0–10.5)
nRBC: 0 % (ref 0.0–0.2)

## 2021-06-06 LAB — FERRITIN: Ferritin: 41 ng/mL (ref 11–307)

## 2021-06-06 LAB — VITAMIN B12: Vitamin B-12: 434 pg/mL (ref 180–914)

## 2021-06-07 ENCOUNTER — Inpatient Hospital Stay (HOSPITAL_BASED_OUTPATIENT_CLINIC_OR_DEPARTMENT_OTHER): Payer: 59 | Admitting: Hematology

## 2021-06-07 DIAGNOSIS — D509 Iron deficiency anemia, unspecified: Secondary | ICD-10-CM | POA: Diagnosis not present

## 2021-06-07 DIAGNOSIS — E559 Vitamin D deficiency, unspecified: Secondary | ICD-10-CM | POA: Diagnosis not present

## 2021-06-07 DIAGNOSIS — E538 Deficiency of other specified B group vitamins: Secondary | ICD-10-CM

## 2021-06-07 NOTE — Progress Notes (Signed)
. ? ? ?HEMATOLOGY/ONCOLOGY PHONE VISIT NOTE ? ?Date of Service: 06/07/2021 ? ? ?Patient Care Team: ?Colon Branch, MD as PCP - General (Internal Medicine) ?Early Osmond, MD as PCP - Cardiology (Cardiology) ?Vanessa Kick, MD as Consulting Physician (Obstetrics and Gynecology) ? ?CHIEF COMPLAINTS/PURPOSE OF CONSULTATION:  ?Follow-up for discussion of lab results of anemia ? ?HISTORY OF PRESENTING ILLNESS:  ?Please see previous note for details on initial presentation ? ?INTERVAL HISTORY ?.I connected with JOSEE SPEECE on 06/07/2021 at  2:20 PM EDT by telephone visit and verified that I am speaking with the correct person using two identifiers.  ? ?I discussed the limitations, risks, security and privacy concerns of performing an evaluation and management service by telemedicine and the availability of in-person appointments. I also discussed with the patient that there may be a patient responsible charge related to this service. The patient expressed understanding and agreed to proceed.  ? ?Other persons participating in the visit and their role in the encounter: patients Husband  ? ?Patient?s location: Home   ?Provider?s location: [place] ? ?Chief Complaint: Lab review for anemia. ? ?Patient was called to discuss her lab results for work-up of anemia. She reports She is doing well with no new symptoms or concerns. ? ?She has decided to hold acid suppressants after doing some independent research. We discussed that she should meet with her GI to discuss this further. We discussed that acid suppressants can cause certain vitamin deficiencies. ? ?Lab results from 06/06/2021 were discussed in detail.  ?CBC with normal limits. ?CMP stable with mild hypocalcemia. Calcium 8.7 mg/dL. ?Iron saturation has improved from 19% to 32% ?Ferritin has improved from 23 ng/mL to 41 ng/mL. ?Vitamin B12 has improved from 174 pg/mL 8 months ago to 434 pg/mL. ? ?MEDICAL HISTORY:  ?Past Medical History:  ?Diagnosis Date   ? Adenomatous colon polyp   ? Allergy   ? Asthma 1990's  ? GERD (gastroesophageal reflux disease)   ? Headache(784.0)   ? Palpitations   ? Pneumonia   ? Tuberculosis 1990's  ? ? ?SURGICAL HISTORY: ?Past Surgical History:  ?Procedure Laterality Date  ? APPENDECTOMY    ? COLONOSCOPY  08/31/2009  ? COLONOSCOPY  12/12/2019  ? COLPOSCOPY  04/28/2016  ? LAPAROSCOPIC APPENDECTOMY N/A 06/18/2020  ? Procedure: APPENDECTOMY LAPAROSCOPIC;  Surgeon: Jesusita Oka, MD;  Location: [facility];  Service: General;  Laterality: N/A;  ? OVARIAN CYST REMOVAL    ? ? ?SOCIAL HISTORY: ?Social History  ? ?Socioeconomic History  ? Marital status: Married  ?  Spouse name: Not on file  ? Number of children: 0  ? Years of education: Not on file  ? Highest education level: Not on file  ?Occupational History  ? Occupation: Scientist, water quality [employer]  ?Tobacco Use  ? Smoking status: Never  ? Smokeless tobacco: Never  ?Vaping Use  ? Vaping Use: Never used  ?Substance and Sexual Activity  ? Alcohol use: Yes  ?  Comment: occasional wine  ? Drug use: No  ? Sexual activity: Not on file  ?Other Topics Concern  ? Not on file  ?Social History Narrative  ? From [place]  ? [id number]  ? ?Social Determinants of Health  ? ?Financial Resource Strain: Not on file  ?Food Insecurity: Not on file  ?Transportation Needs: Not on file  ?Physical Activity: Not on file  ?Stress: Not on file  ?Social Connections: Not on file  ?Intimate Partner Violence: Not on file  ? ? ?FAMILY  HISTORY: ?Family History  ?Problem Relation Age of Onset  ? Hypertension Mother   ? High Cholesterol Mother   ? Atrial fibrillation Father   ?     pacemaker  ? Other Father   ?     blood clotting disorder  ? Hypertension Brother   ? Hyperlipidemia Brother   ? Diabetes Paternal Grandfather   ? Heart disease Other   ? Hypertension Other   ? Hyperlipidemia Other   ? Hypertension Other   ? Colon cancer Neg Hx   ? Breast cancer Neg Hx   ? Colon polyps Neg Hx   ? ? ?ALLERGIES:  is allergic to  clindamycin. ? ?MEDICATIONS:  ?Current Outpatient Medications  ?Medication Sig Dispense Refill  ? aspirin EC 81 MG tablet Take 1 tablet (81 mg total) by mouth daily. Swallow whole. 90 tablet 3  ? atorvastatin (LIPITOR) 40 MG tablet Take 1 tablet (40 mg total) by mouth daily. 90 tablet 3  ? calcium-vitamin D 250-100 MG-UNIT tablet Take 1 tablet by mouth in the morning, at noon, and at bedtime.    ? estradiol (ESTRACE) 0.1 MG/GM vaginal cream     ? famotidine (PEPCID) 20 MG tablet TAKE ONE TABLET BY MOUTH ONE TIME A DAY 90 tablet 0  ? ferrous fumarate (HEMOCYTE - 106 MG FE) 325 (106 Fe) MG TABS tablet Take 1 tablet (106 mg of iron total) by mouth 2 (two) times daily. 60 tablet 5  ? gabapentin (NEURONTIN) 100 MG capsule Take 100 mg by mouth daily.    ? ibandronate (BONIVA) 150 MG tablet TAKE ONE TABLET BY MOUTH EVERY 30 DAYS 3 tablet 3  ? pantoprazole (PROTONIX) 40 MG tablet Take 1 tablet (40 mg total) by mouth 2 (two) times daily before a meal. Pantoprazole 40 mg twice a day for 8 weeks. 60 tablet 2  ? vitamin B-12 (CYANOCOBALAMIN) 1000 MCG tablet Take 1,000 mcg by mouth daily.    ? ?No current facility-administered medications for this visit.  ? ? ?REVIEW OF SYSTEMS:   ? ?Still with some grade 1 fatigue.  No lightheadedness or dizziness. ? ? ?PHYSICAL EXAMINATION: ?Telemedicine visit ? ?LABORATORY DATA:  ?I have reviewed the data as listed ? ?. ? ?  Latest Ref Rng & Units 06/06/2021  ?  7:32 AM 05/13/2021  ?  7:29 AM 01/24/2021  ?  2:28 PM  ?CBC  ?WBC 4.0 - 10.5 K/uL 5.3   6.3     ?Hemoglobin 12.0 - 15.0 g/dL 12.6   13.6     ?Hematocrit 36.0 - 46.0 % 39.3   40.5   36.5    ?Platelets 150 - 400 K/uL 243   244     ? ?.CBC ?   ?Component Value Date/Time  ? WBC 5.3 06/06/2021 0732  ? WBC 5.6 01/24/2021 1427  ? RBC 4.14 06/06/2021 0732  ? HGB 12.6 06/06/2021 0732  ? HGB 13.6 05/13/2021 0729  ? HCT 39.3 06/06/2021 0732  ? HCT 40.5 05/13/2021 0729  ? PLT 243 06/06/2021 0732  ? PLT 244 05/13/2021 0729  ? MCV 94.9 06/06/2021 0732   ? MCV 93 05/13/2021 0729  ? MCH 30.4 06/06/2021 0732  ? MCHC 32.1 06/06/2021 0732  ? RDW 13.0 06/06/2021 0732  ? RDW 12.9 05/13/2021 0729  ? LYMPHSABS 1.8 06/06/2021 0732  ? LYMPHSABS 2.6 08/15/2019 1519  ? MONOABS 0.4 06/06/2021 0732  ? EOSABS 0.4 06/06/2021 0732  ? EOSABS 0.7 (H) 08/15/2019 1519  ? BASOSABS 0.1 06/06/2021 0732  ?  BASOSABS 0.1 08/15/2019 1519  ? ? ?. ? ?  Latest Ref Rng & Units 06/06/2021  ?  7:32 AM 05/13/2021  ?  7:29 AM 01/24/2021  ?  2:27 PM  ?CMP  ?Glucose 70 - 99 mg/dL 94   89   88    ?BUN 8 - 23 mg/dL '20   17   13    '$ ?Creatinine 0.44 - 1.00 mg/dL 0.54   0.54   0.62    ?Sodium 135 - 145 mmol/L 142   140   140    ?Potassium 3.5 - 5.1 mmol/L 4.0   4.0   3.6    ?Chloride 98 - 111 mmol/L 110   105   108    ?CO2 22 - 32 mmol/L '27   24   25    '$ ?Calcium 8.9 - 10.3 mg/dL 8.7   8.8   8.4    ?Total Protein 6.5 - 8.1 g/dL 6.9   6.9   6.8    ?Total Bilirubin 0.3 - 1.2 mg/dL 0.4   0.3   0.3    ?Alkaline Phos 38 - 126 U/L 64   72   68    ?AST 15 - 41 U/L '14   17   18    '$ ?ALT 0 - 44 U/L '13   15   18    '$ ? ? ?. ?Lab Results  ?Component Value Date  ? IRON 101 06/06/2021  ? TIBC 314 06/06/2021  ? IRONPCTSAT 32 (H) 06/06/2021  ? ?(Iron and TIBC) ? ?Lab Results  ?Component Value Date  ? FERRITIN 41 06/06/2021  ? ? ? ? ? ?Component ?    Latest Ref Rng & Units 01/24/2021  ?Copper ?    80 - 158 ug/dL 111  ?Vitamin D, 25-Hydroxy ?    30 - 100 ng/mL 30.97  ?TSH ?    0.308 - 3.960 uIU/mL 0.893  ?Intrinsic Factor ?    0.0 - 1.1 AU/mL 1.0  ?Parietal Cell Antibody-IgG ?    0.0 - 20.0 Units 6.8  ?Vitamin B12 ?    180 - 914 pg/mL 289  ? ? ? ?EGD 11/15/2020: - The examined esophagus was normal. Biopsies were obtained from the mid/proximal and ?distal esophagus with cold forceps for histology of suspected eosinophilic esophagitis. All of ?these biopsies were placed in one specimen container. ?Findings: ?- Multiple small erosions with no bleeding and no stigmata of recent bleeding were found in ?the gastric antrum. Biopsies were  taken from the antrum, body, and fundus with a cold ?forceps for histology. Estimated blood loss was minimal. ?- The examined duodenum was normal. Biopsies were taken with a cold forceps for histology ? ?Colonoscopy 10/8/202

## 2021-07-27 IMAGING — MR MR ABDOMEN WO/W CM
19 series · 48 of 48 positions shown · IV contrast (5ml GADAVIST)
Comparison: CT scan 06/18/2020

CLINICAL DATA: Evaluate left hepatic lobe lesion seen on recent CT
scan.

EXAM:
MRI ABDOMEN WITHOUT AND WITH CONTRAST
TECHNIQUE: Multiplanar multisequence MR imaging of the abdomen was performed
both before and after the administration of intravenous contrast.
CONTRAST:  5mL GADAVIST GADOBUTROL 1 MMOL/ML IV SOLN

[Series 3: T2 · coronal · 6.0mm · 1.68mm/px · 2 of 30 slices shown (1 of 2)]
[im 1/30]
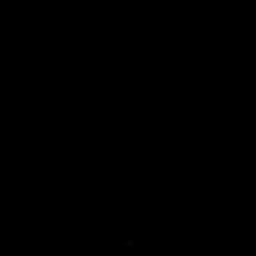
[im 30/30]
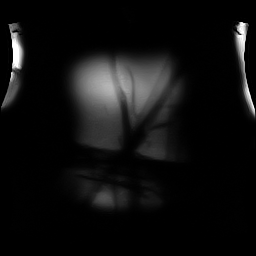

[Series 4: T2 fat-sat · axial · 6.0mm · 1.34mm/px · z∈[-85,+167]mm · 2 of 36 slices shown (1 of 2)]
[im 1/36]
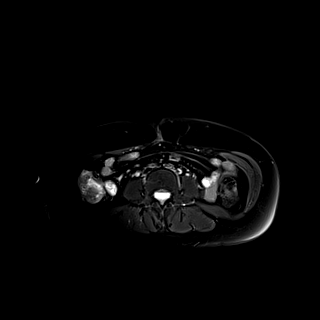
[im 36/36]
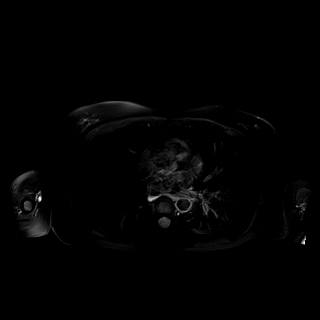

[Series 5: T2 fat-sat · 2 of 3 slices shown (2 of 2)]
[im 1/3]
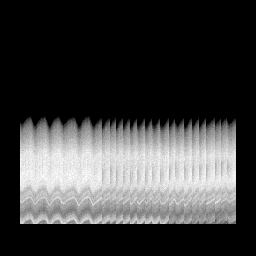
[im 3/3]
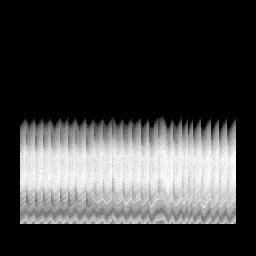

[Series 6: T1 · axial · 3.0mm · 1.34mm/px · z∈[-65,+172]mm · 3 of 80 slices shown (1 of 2)]
[im 1/80]
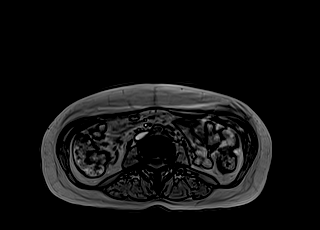
[im 40/80]
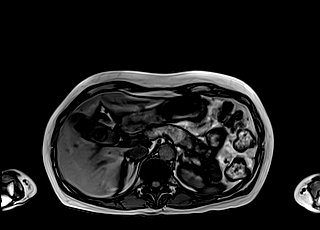
[im 80/80]
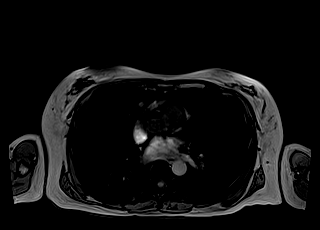

[Series 7: T1 · axial · 3.0mm · 1.34mm/px · z∈[-65,+172]mm · 3 of 80 slices shown (2 of 2)]
[im 1/80]
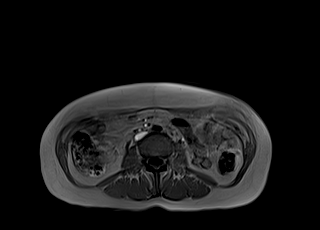
[im 40/80]
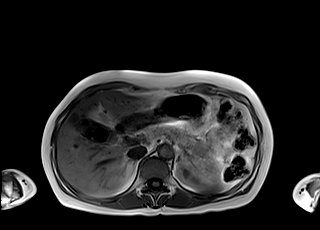
[im 80/80]
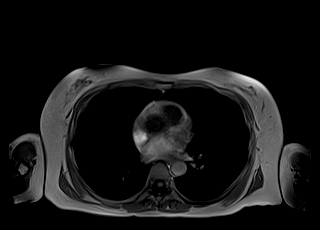

[Series 8: DWI · axial · 6.0mm · 1.60mm/px · z∈[-66,+172]mm · 3 of 68 slices shown (1 of 2)]
[im 1/68]
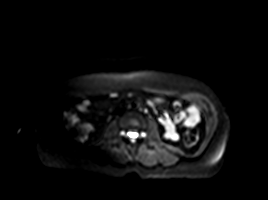
[im 34/68]
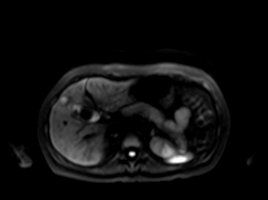
[im 68/68]
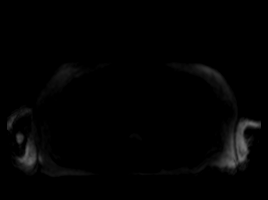

[Series 9: DWI · axial · 6.0mm · 1.60mm/px · 1 of 34 slices shown (2 of 2)]
[im 1/34]
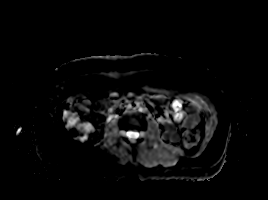

[Series 10: bSSFP · axial · 4.0mm · 0.84mm/px · z∈[-66,+174]mm · 2 of 61 slices shown]
[im 1/61]
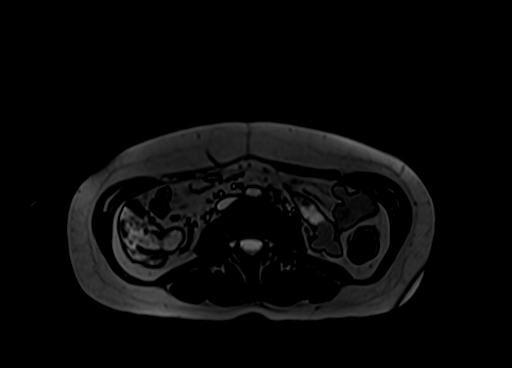
[im 61/61]
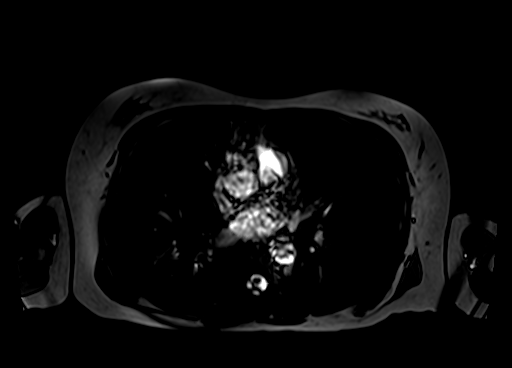

[Series 12: T1 dynamic · axial · 3.0mm · 1.34mm/px · z∈[-69,+168]mm · 3 of 80 slices shown (1 of 10)]
[im 1/80]
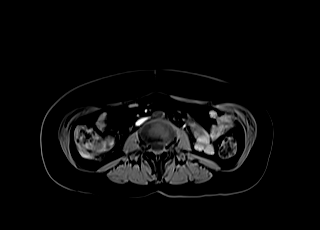
[im 40/80]
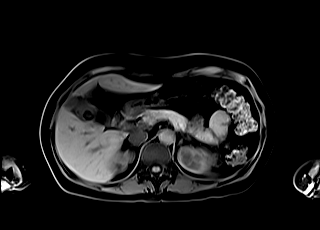
[im 80/80]
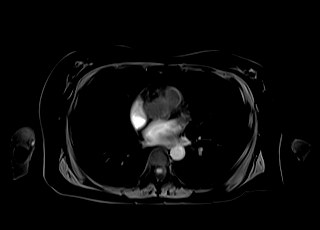

[Series 16: T1 dynamic · axial · 3.0mm · 1.34mm/px · z∈[-69,+168]mm · 3 of 80 slices shown (2 of 10)]
[im 1/80]
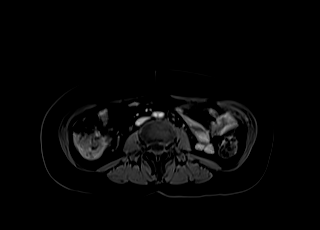
[im 40/80]
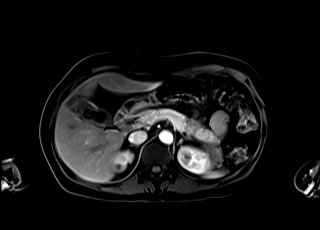
[im 80/80]
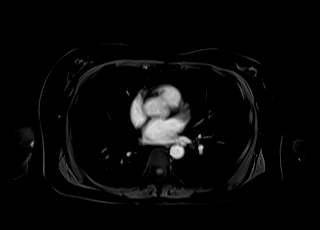

[Series 17: T1 dynamic · axial · 3.0mm · 1.34mm/px · z∈[-69,+168]mm · 3 of 80 slices shown (3 of 10)]
[im 1/80]
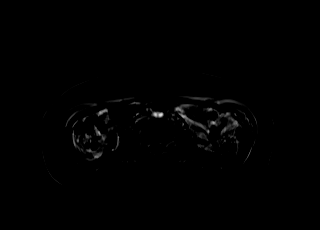
[im 40/80]
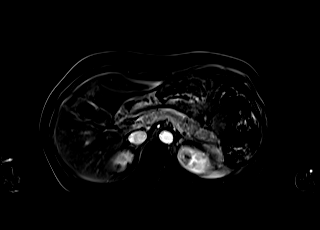
[im 80/80]
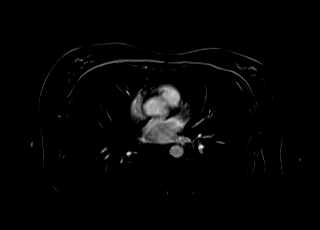

[Series 20: T1 dynamic · axial · 3.0mm · 1.34mm/px · z∈[-69,+168]mm · 3 of 80 slices shown (4 of 10)]
[im 1/80]
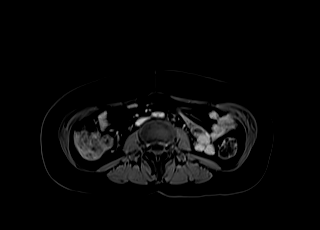
[im 40/80]
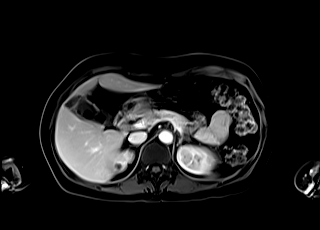
[im 80/80]
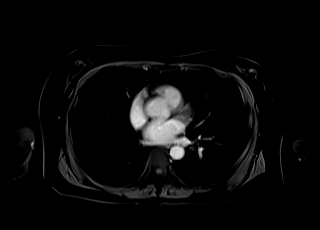

[Series 21: T1 dynamic · axial · 3.0mm · 1.34mm/px · z∈[-69,+168]mm · 3 of 80 slices shown (5 of 10)]
[im 1/80]
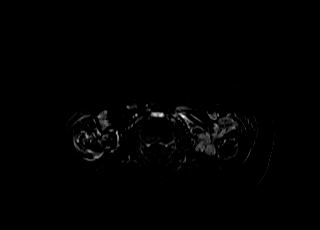
[im 40/80]
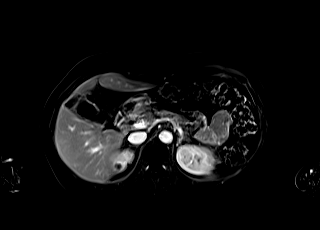
[im 80/80]
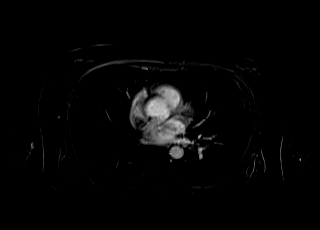

[Series 24: T1 dynamic · axial · 3.0mm · 1.34mm/px · z∈[-69,+168]mm · 3 of 80 slices shown (6 of 10)]
[im 1/80]
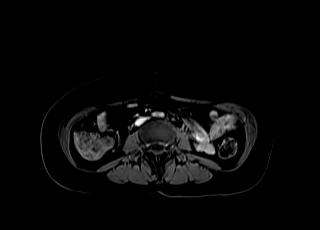
[im 40/80]
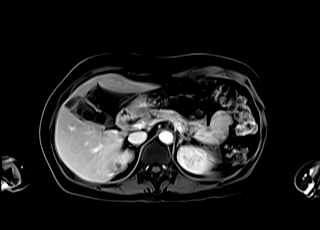
[im 80/80]
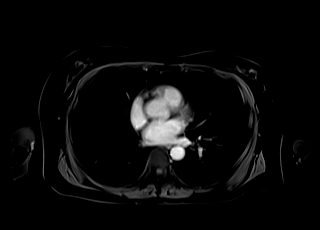

[Series 25: T1 dynamic · axial · 3.0mm · 1.34mm/px · z∈[-69,+168]mm · 3 of 80 slices shown (7 of 10)]
[im 1/80]
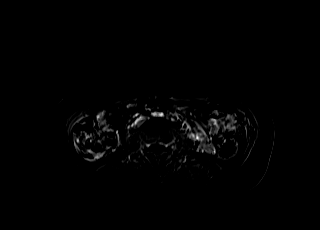
[im 40/80]
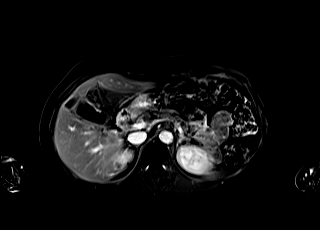
[im 80/80]
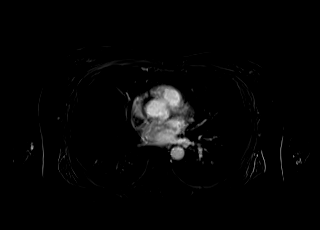

[Series 27: T1 dynamic · coronal · 5.0mm · 1.41mm/px · 2 of 44 slices shown (8 of 10)]
[im 1/44]
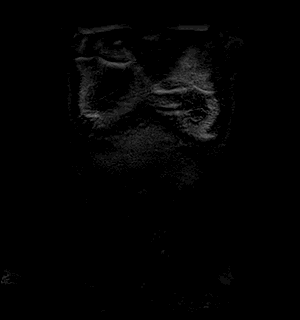
[im 44/44]
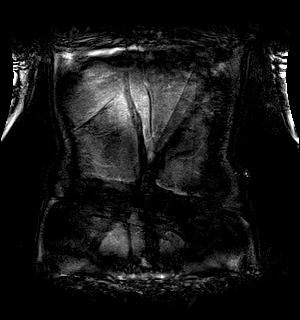

[Series 28: T2 · axial · 6.0mm · 1.68mm/px · 1 of 34 slices shown (2 of 2)]
[im 1/34]
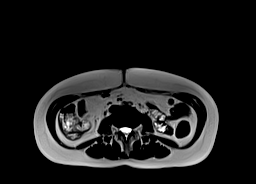

[Series 31: T1 dynamic · axial · 3.0mm · 1.34mm/px · z∈[-69,+168]mm · 3 of 80 slices shown (9 of 10)]
[im 1/80]
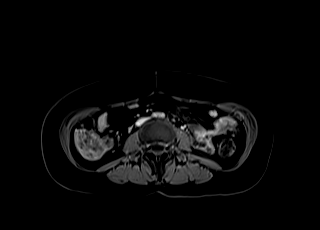
[im 40/80]
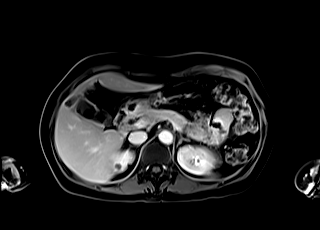
[im 80/80]
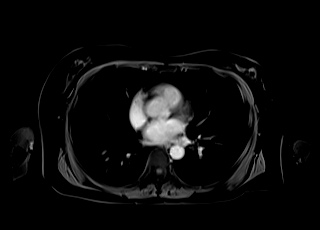

[Series 32: T1 dynamic · axial · 3.0mm · 1.34mm/px · z∈[-69,+168]mm · 3 of 80 slices shown (10 of 10)]
[im 1/80]
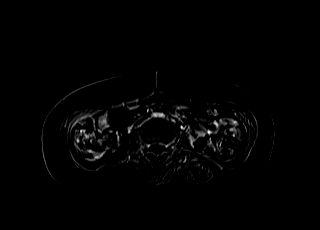
[im 40/80]
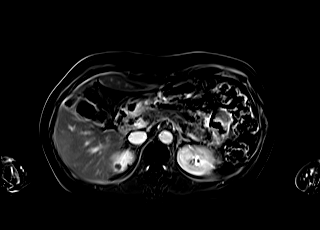
[im 80/80]
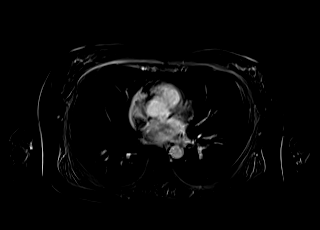

[48 of 48 positions shown; findings below may reference images not displayed]

FINDINGS: Lower chest: The lung bases are clear of an acute process. No
pulmonary lesions or pleural effusions. No pericardial effusion.

Hepatobiliary: There is a stable 11 mm lesion in segment 7 which
demonstrates imaging features of a benign flash filling hemangioma.
This was also noted on the prior CT scan from 0102.

The left hepatic lobe lesion anteriorly in segment 2 measures
mm. There appears to be a thin septation but no contrast
enhancement. Findings consistent with a benign cyst.

Gallstones are noted in the gallbladder. The largest calculus
measures 19 mm. No findings for acute cholecystitis. Normal caliber
and course of the common bile duct. The portal and hepatic veins are
normal.

Pancreas:  No mass, inflammation or ductal dilatation.

Spleen:  Normal size. No focal lesions.

Adrenals/Urinary Tract:  Adrenal glands are unremarkable.

Simple nonenhancing renal cysts are noted. No worrisome renal
lesions or hydronephrosis.

Stomach/Bowel: The stomach, duodenum, visualized small bowel and
visualized colon are grossly normal.

Vascular/Lymphatic: The aorta and branch vessels are patent. The
major venous structures are patent. No mesenteric or retroperitoneal
mass or adenopathy.

Other:  No ascites or abdominal wall hernia.

Musculoskeletal: No significant bony findings.
IMPRESSION: 1. 11 mm right hepatic lobe lesion is consistent with a benign flash
filling hemangioma.
2. 8.5 mm left hepatic lobe lesion is consistent with a benign cyst.
3. Cholelithiasis but no findings for acute cholecystitis.
4. No acute abdominal findings, mass lesions or adenopathy.

## 2021-08-23 NOTE — Progress Notes (Signed)
Cardiology Office Note:    Date:  05/19/2022   ID:  [name], DOB 08-06-1960, MRN [id number]  PCP:  Colon Branch, MD   [facility] Providers Cardiologist:  [name], MD Referring MD: Colon Branch, MD   Chief Complaint/Reason for Referral: Follow-up  ASSESSMENT:    1. Aortic atherosclerosis ([place])   2. Hyperlipidemia, unspecified hyperlipidemia type   3. SVT (supraventricular tachycardia) ([facility])     PLAN:    In order of problems listed above: 1.  Aortic atherosclerosis: Continue aspirin and statin. 2.  Hyperlipidemia: We will check lipid panel and LFTs now on augmented dose of atorvastatin. 3.  SVT: Consider beta-blocker if symptomatic.                  History of Present Illness:    FOCUSED CARDIOVASCULAR PROBLEM LIST:   1.  Atypical chest pain with normal ETT 2016 2.  Iron deficiency anemia 3.  GERD 4.  Osteoporosis 5.  Hyperlipidemia with  atherosclerosis seen on CT abdomen pelvis in April 2022   December 2022: Patient seen for recommendations regarding incidentally noted aortic atherosclerosis on CT scan.  She was started on atorvastatin 40 mg and aspirin 81 mg.  Lipid panel following atorvastatin administration demonstrated an LDL of 111 and her atorvastatin was increased to 80 mg.  Due to palpitations she was referred for monitor which demonstrated frequent SVT, isolated PVCs and PACs.  An echocardiogram was performed without significant findings.  Today:         Current Medications: No outpatient medications have been marked as taking for the 08/26/21 encounter (Office Visit) with Early Osmond, MD.     Allergies:    Clindamycin and Gabapentin   Social History:   Social History   Tobacco Use   Smoking status: Never   Smokeless tobacco: Never  Vaping Use   Vaping Use: Never used  Substance Use Topics   Alcohol use: Yes    Comment: occasional wine   Drug use: No     Family Hx: Family History  Problem Relation Age of  Onset   Hypertension Mother    High Cholesterol Mother    Atrial fibrillation Father        pacemaker   Other Father        blood clotting disorder   Hypertension Brother    Hyperlipidemia Brother    Diabetes Paternal Grandfather    Heart disease Other    Hypertension Other    Hyperlipidemia Other    Hypertension Other    Colon cancer Neg Hx    Breast cancer Neg Hx    Colon polyps Neg Hx      Review of Systems:   Please see the history of present illness.    All other systems reviewed and are negative.     EKGs/Labs/Other Test Reviewed:    EKG:  EKG today demonstrates sinus rhythm with PACs; prior EKGs demonstrate from 2015 demonstrates sinus rhythm with low voltage  Prior CV studies:  Echocardiogram 2022 with ejection fraction of 65 to 70% with aortic valve sclerosis  Monitor 2022 frequent SVT lasting up to 21 seconds and isolated PACs and PVCs.  ETT 2016 achieving 11.6 METS  Imaging studies that I have independently reviewed today: Abd CT  Recent Labs: 06/06/2021: ALT 13 11/28/2021: BUN 16; Creatinine, Ser 0.52; Hemoglobin 13.1; Platelets 244; Potassium 4.6; Sodium 137   Recent Lipid Panel Lab Results  Component Value Date/Time   CHOL 227 (H)  05/10/2022 10:13 AM   TRIG 78 05/10/2022 10:13 AM   HDL 77 05/10/2022 10:13 AM   LDLCALC 137 (H) 05/10/2022 10:13 AM    Risk Assessment/Calculations:      Physical Exam:      Signed, Early Osmond, MD  05/19/2022 9:02 AM    [facility] [address], [place], [place]  [address] Phone: [phone]; Fax: [phone]   Note:  This document was prepared using Dragon voice recognition software and may include unintentional dictation errors.

## 2021-08-26 ENCOUNTER — Ambulatory Visit (INDEPENDENT_AMBULATORY_CARE_PROVIDER_SITE_OTHER): Payer: 59 | Admitting: Internal Medicine

## 2021-08-26 DIAGNOSIS — I700 Atherosclerosis of aorta: Secondary | ICD-10-CM

## 2021-08-26 DIAGNOSIS — E785 Hyperlipidemia, unspecified: Secondary | ICD-10-CM

## 2021-08-26 DIAGNOSIS — I471 Supraventricular tachycardia: Secondary | ICD-10-CM

## 2021-10-14 ENCOUNTER — Encounter: Payer: 59 | Admitting: Internal Medicine

## 2021-10-21 ENCOUNTER — Telehealth: Payer: Self-pay | Admitting: Internal Medicine

## 2021-10-21 DIAGNOSIS — M810 Age-related osteoporosis without current pathological fracture: Secondary | ICD-10-CM

## 2021-10-21 DIAGNOSIS — E538 Deficiency of other specified B group vitamins: Secondary | ICD-10-CM

## 2021-10-21 DIAGNOSIS — E785 Hyperlipidemia, unspecified: Secondary | ICD-10-CM

## 2021-10-21 DIAGNOSIS — E559 Vitamin D deficiency, unspecified: Secondary | ICD-10-CM

## 2021-10-21 DIAGNOSIS — D649 Anemia, unspecified: Secondary | ICD-10-CM

## 2021-10-21 NOTE — Telephone Encounter (Signed)
Pt's husband is wanting to get cpe labs beforehand. Please advise once orders are in so pt can be scheduled. They would like to come in 9/25 in the morning if possible. They are also wanting to get b-12, vitamin d, and iron checked. Okay to call pt's husband.

## 2021-11-08 NOTE — Telephone Encounter (Signed)
Orders placed. Please schedule lab appt several days prior to her appt w/ PCP please.

## 2021-11-08 NOTE — Telephone Encounter (Signed)
B12, vitamin D, iron ferritin, BMP, FLP, CBC Dx: CPX, B12 deficiency, high cholesterol, osteoporosis

## 2021-11-26 ENCOUNTER — Other Ambulatory Visit: Payer: Self-pay | Admitting: Internal Medicine

## 2021-11-28 ENCOUNTER — Other Ambulatory Visit (INDEPENDENT_AMBULATORY_CARE_PROVIDER_SITE_OTHER): Payer: 59

## 2021-11-28 ENCOUNTER — Other Ambulatory Visit: Payer: 59

## 2021-11-28 DIAGNOSIS — E559 Vitamin D deficiency, unspecified: Secondary | ICD-10-CM

## 2021-11-28 DIAGNOSIS — D649 Anemia, unspecified: Secondary | ICD-10-CM

## 2021-11-28 DIAGNOSIS — E538 Deficiency of other specified B group vitamins: Secondary | ICD-10-CM

## 2021-11-28 DIAGNOSIS — E785 Hyperlipidemia, unspecified: Secondary | ICD-10-CM

## 2021-11-28 NOTE — Addendum Note (Signed)
Addended by: Kelle Darting A on: 11/28/2021 08:57 AM   Modules accepted: Orders

## 2021-11-29 LAB — LIPID PANEL
Chol/HDL Ratio: 2.6 ratio (ref 0.0–4.4)
Cholesterol, Total: 218 mg/dL — ABNORMAL HIGH (ref 100–199)
HDL: 84 mg/dL (ref >39)
LDL Chol Calc (NIH): 121 mg/dL — ABNORMAL HIGH (ref 0–99)
Triglycerides: 72 mg/dL (ref 0–149)
VLDL Cholesterol Cal: 13 mg/dL (ref 5–40)

## 2021-11-29 LAB — CBC WITH DIFFERENTIAL/PLATELET
Basophils Absolute: 0.1 10*3/uL (ref 0.0–0.2)
Basos: 1 %
EOS (ABSOLUTE): 0.4 10*3/uL (ref 0.0–0.4)
Eos: 8 %
Hematocrit: 39.8 % (ref 34.0–46.6)
Hemoglobin: 13.1 g/dL (ref 11.1–15.9)
Immature Grans (Abs): 0 10*3/uL (ref 0.0–0.1)
Immature Granulocytes: 0 %
Lymphocytes Absolute: 1.9 10*3/uL (ref 0.7–3.1)
Lymphs: 37 %
MCH: 31 pg (ref 26.6–33.0)
MCHC: 32.9 g/dL (ref 31.5–35.7)
MCV: 94 fL (ref 79–97)
Monocytes Absolute: 0.3 10*3/uL (ref 0.1–0.9)
Monocytes: 6 %
Neutrophils Absolute: 2.4 10*3/uL (ref 1.4–7.0)
Neutrophils: 48 %
Platelets: 244 10*3/uL (ref 150–450)
RBC: 4.23 x10E6/uL (ref 3.77–5.28)
RDW: 12.8 % (ref 11.7–15.4)
WBC: 5 10*3/uL (ref 3.4–10.8)

## 2021-11-29 LAB — BASIC METABOLIC PANEL
BUN/Creatinine Ratio: 31 — ABNORMAL HIGH (ref 12–28)
BUN: 16 mg/dL (ref 8–27)
CO2: 23 mmol/L (ref 20–29)
Calcium: 9.4 mg/dL (ref 8.7–10.3)
Chloride: 103 mmol/L (ref 96–106)
Creatinine, Ser: 0.52 mg/dL — ABNORMAL LOW (ref 0.57–1.00)
Glucose: 87 mg/dL (ref 70–99)
Potassium: 4.6 mmol/L (ref 3.5–5.2)
Sodium: 137 mmol/L (ref 134–144)
eGFR: 106 mL/min/{1.73_m2} (ref >59)

## 2021-11-29 LAB — VITAMIN D 25 HYDROXY (VIT D DEFICIENCY, FRACTURES): Vit D, 25-Hydroxy: 37.8 ng/mL (ref 30.0–100.0)

## 2021-11-29 LAB — B12 AND FOLATE PANEL
Folate: 13.4 ng/mL (ref >3.0)
Vitamin B-12: 1058 pg/mL (ref 232–1245)

## 2021-11-29 LAB — FERRITIN: Ferritin: 80 ng/mL (ref 15–150)

## 2021-11-29 LAB — IRON: Iron: 117 ug/dL (ref 27–139)

## 2021-12-02 ENCOUNTER — Ambulatory Visit (INDEPENDENT_AMBULATORY_CARE_PROVIDER_SITE_OTHER): Payer: 59 | Admitting: Internal Medicine

## 2021-12-02 ENCOUNTER — Encounter: Payer: Self-pay | Admitting: Internal Medicine

## 2021-12-02 VITALS — BP 124/68 | HR 64 | Temp 98.6°F | Resp 16 | Ht 60.0 in | Wt 114.0 lb

## 2021-12-02 DIAGNOSIS — M810 Age-related osteoporosis without current pathological fracture: Secondary | ICD-10-CM

## 2021-12-02 DIAGNOSIS — E538 Deficiency of other specified B group vitamins: Secondary | ICD-10-CM | POA: Diagnosis not present

## 2021-12-02 DIAGNOSIS — Z0001 Encounter for general adult medical examination with abnormal findings: Secondary | ICD-10-CM

## 2021-12-02 DIAGNOSIS — E559 Vitamin D deficiency, unspecified: Secondary | ICD-10-CM

## 2021-12-02 DIAGNOSIS — Z23 Encounter for immunization: Secondary | ICD-10-CM | POA: Diagnosis not present

## 2021-12-02 DIAGNOSIS — Z0000 Encounter for general adult medical examination without abnormal findings: Secondary | ICD-10-CM

## 2021-12-02 DIAGNOSIS — D509 Iron deficiency anemia, unspecified: Secondary | ICD-10-CM

## 2021-12-02 DIAGNOSIS — I700 Atherosclerosis of aorta: Secondary | ICD-10-CM

## 2021-12-02 NOTE — Assessment & Plan Note (Signed)
-  Td: 2018 - Covid shot June 2023, next ~ 03-2022 -Shingrix: d/w  - Flu shot today - Female care:  Next visit w/ gyn scheduled ; PAP 09/2017 per [place], [place] 05-2020 - [facility]- Cscope 2011, Dr Deatra Ina, [place] 12-2019, next per GI.   - Labs: All recent labs reviewed. -POA: Discussed

## 2021-12-02 NOTE — Progress Notes (Signed)
Subjective:    Patient ID: [name], female    DOB: 05/16/1960, 61 y.o.   MRN: [id number]  DOS:  12/02/2021 Type of visit - description: cpx  Here for CPX.  Her husband is with her. In general feeling well. Denies any GI symptoms. Meds reviewed, not taking all of them.   Review of Systems   A 14 point review of systems is negative    Past Medical History:  Diagnosis Date   Adenomatous colon polyp    Allergy    Asthma 1990's   GERD (gastroesophageal reflux disease)    Headache(784.0)    Palpitations    Pneumonia    Tuberculosis 1990's    Past Surgical History:  Procedure Laterality Date   APPENDECTOMY     COLONOSCOPY  08/31/2009   COLONOSCOPY  12/12/2019   COLPOSCOPY  04/28/2016   LAPAROSCOPIC APPENDECTOMY N/A 06/18/2020   Procedure: APPENDECTOMY LAPAROSCOPIC;  Surgeon: Jesusita Oka, MD;  Location: [facility];  Service: General;  Laterality: N/A;   OVARIAN CYST REMOVAL     Social History   Socioeconomic History   Marital status: Married    Spouse name: Not on file   Number of children: 0   Years of education: Not on file   Highest education level: Not on file  Occupational History   Occupation: Scientist, water quality [employer]  Tobacco Use   Smoking status: Never   Smokeless tobacco: Never  Vaping Use   Vaping Use: Never used  Substance and Sexual Activity   Alcohol use: Yes    Comment: occasional wine   Drug use: No   Sexual activity: Not on file  Other Topics Concern   Not on file  Social History Narrative   From [place] Determinants of Health   Financial Resource Strain: Not on file  Food Insecurity: Not on file  Transportation Needs: Not on file  Physical Activity: Not on file  Stress: Not on file  Social Connections: Not on file  Intimate Partner Violence: Not on file     Current Outpatient Medications  Medication Instructions   aspirin EC 81 mg, Oral, Daily, Swallow whole.   atorvastatin (LIPITOR) 40 mg, Oral, Daily    cholecalciferol (VITAMIN D3) 1,000 Units, Oral, Daily   cyanocobalamin (VITAMIN B12) 1,000 mcg, Oral, Daily   estradiol (ESTRACE) 0.1 MG/GM vaginal cream    famotidine (PEPCID) 20 MG tablet TAKE ONE TABLET BY MOUTH ONE TIME A DAY   ferrous fumarate (FERRETTS) 325 (106 Fe) MG TABS tablet 106 mg of iron, Oral, 2 times daily       Objective:   Physical Exam BP 124/68   Pulse 64   Temp 98.6 F (37 C) (Oral)   Resp 16   Ht 5' (1.524 m)   Wt 114 lb (51.7 kg)   SpO2 99%   BMI 22.26 kg/m  General: Well developed, NAD, BMI noted Neck: No  thyromegaly  HEENT:  Normocephalic . Face symmetric, atraumatic Lungs:  CTA B Normal respiratory effort, no intercostal retractions, no accessory muscle use. Heart: RRR,  no murmur.  Abdomen:  Not distended, soft, non-tender. No rebound or rigidity.   Lower extremities: no pretibial edema bilaterally  Skin: Exposed areas without rash. Not pale. Not jaundice Neurologic:  alert & oriented X3.  Speech normal, gait appropriate for age and unassisted Strength symmetric and appropriate for age.  Psych: Cognition and judgment appear intact.  Cooperative with normal attention span and concentration.  Behavior appropriate. No anxious or depressed appearing.     Assessment     ASSESSMENT  (new patient, 02/2016) Chest pain, dizziness: Normal ETT echo and event monitor 2011. ETT again 04-2014: Normal, good exercise capacity Hematuria: per patient previous urology w/u (-). Dr McDermoth Menopause since ~ age 86 H/o hair  loss, saw dermatology, biopsy done. IDA-GERD-Dyspepsia- cholelithiasis  : rx bentyl - Colonoscopy 2021, EGD 2022 - Celiac work-up negative - Saw hematology April 2023 Osteoporosis: T score -2.9 07/2017 Vit D def 2014 History of tuberculosis in her 7s. Asthma, eosinophilia: Seen by pulmonology 12-2019  PLAN: Here for CPX Cardiovascular: Aortic sclerosis found on CT 06/2020 Calcium coronary score 0 on 02/2021 Saw cardiology at  [facility] 05/10/2021, apparently no statins were recommended. Saw cards Dr Ali Lowe June 2023, was rx  aspirin and statin due to aortic sclerosis. For NSVT continue BBs Decided not to  take aspirin or atorvastatin, not on BBs; explained the benefits of medication in light of aortic sclerosis despite a negative calcium coronary score.  The patient is not sure if she will proceed.  Again benefits discussed. Osteoporosis: Reports was advised to stop Boniva by endocrinology. Vitamin D deficiency: On supplements Iron deficiency anemia, GERD Last visit with GI 12-2020 Colonoscopy 2021 EGD September 2022, normal esophagus, erosive gastropathy with no recent bleeding.  Biopsies taken.  Normal duodenum, no H. pylori, esophagus with increased intraepithelial lymphocytes and rare eosinophils Saw Hematology April 2023: Continue with iron.  Last level satisfactory, patient wonders if she needs to cont iron.  Plan is to recheck levels at the next visit Currently on Pepcid only decided not to take PPIs. B12 deficiency: On supplements.  Recheck on RTC RTC 6 months   In addition to CPX, we discussed all her chronic medical problems, extensive chart review.  They have multiple questions, both the patient and her husband, I tried to answer them to the best of my ability.

## 2021-12-02 NOTE — Assessment & Plan Note (Signed)
Here for CPX Cardiovascular: Aortic sclerosis found on CT 06/2020 Calcium coronary score 0 on 02/2021 Saw cardiology at [facility] 05/10/2021, apparently no statins were recommended. Saw cards Dr Ali Lowe June 2023, was rx  aspirin and statin due to aortic sclerosis. For NSVT continue BBs Decided not to  take aspirin or atorvastatin, not on BBs; explained the benefits of medication in light of aortic sclerosis despite a negative calcium coronary score.  The patient is not sure if she will proceed.  Again benefits discussed. Osteoporosis: Reports was advised to stop Boniva by endocrinology. Vitamin D deficiency: On supplements Iron deficiency anemia, GERD Last visit with GI 12-2020 Colonoscopy 2021 EGD September 2022, normal esophagus, erosive gastropathy with no recent bleeding.  Biopsies taken.  Normal duodenum, no H. pylori, esophagus with increased intraepithelial lymphocytes and rare eosinophils Saw Hematology April 2023: Continue with iron.  Last level satisfactory, patient wonders if she needs to cont iron.  Plan is to recheck levels at the next visit Currently on Pepcid only decided not to take PPIs. B12 deficiency: On supplements.  Recheck on RTC RTC 6 months

## 2021-12-02 NOTE — Patient Instructions (Addendum)
Commendation is: Go back on aspirin and atorvastatin  Vaccines: Consider shingles vaccine Consider COVID booster by January 2024     [place], PLEASE SCHEDULE YOUR APPOINTMENTS Come back for a checkup in 6 months     Advanced care planning:  Do you have a "Living will", "[place] of attorney" ?   If you already have a living will or healthcare power of attorney, is recommended you bring the copy to be scanned in your chart. The document will be available to all the doctors you see in the system.  If you don't have one, please consider create one.  Advance care planning is a process that supports adults in  understanding and sharing their preferences regarding future medical care.   The patient's preferences are recorded in documents called Advance Directives.    Advanced directives are completed (and can be modified at any time) while the patient is in full mental capacity.   The documentation should be available at all times to the patient, the family and the healthcare providers.   This legal documents direct treatment decision making and/or appoint a surrogate to make the decision if the patient is not capable to do so.    Advance directives can be documented in many types of formats,  documents have names such as:  Lliving will  Durable power of attorney for healthcare (healthcare proxy or healthcare power of attorney)  Combined directives  Physician orders for life-sustaining treatment    More information at:  [email or web address]

## 2022-01-06 LAB — HM PAP SMEAR: HM Pap smear: NEGATIVE

## 2022-01-06 LAB — HM MAMMOGRAPHY

## 2022-01-16 ENCOUNTER — Encounter: Payer: Self-pay | Admitting: Internal Medicine

## 2022-01-31 ENCOUNTER — Ambulatory Visit (HOSPITAL_COMMUNITY)
Admission: EM | Admit: 2022-01-31 | Discharge: 2022-01-31 | Disposition: A | Discharge status: Home / Self Care | Payer: 59 | Attending: Emergency Medicine | Admitting: Emergency Medicine

## 2022-01-31 ENCOUNTER — Encounter (HOSPITAL_COMMUNITY): Payer: Self-pay

## 2022-01-31 DIAGNOSIS — U071 COVID-19: Secondary | ICD-10-CM | POA: Diagnosis not present

## 2022-01-31 MED ORDER — NIRMATRELVIR/RITONAVIR (PAXLOVID)TABLET: 3.0000 | ORAL_TABLET | Freq: Two times a day (BID) | ORAL | 0 refills | Status: AC

## 2022-01-31 NOTE — Discharge Instructions (Addendum)
I have sent the antivirals to your pharmacy. Instructions on how to take them are on the packaging.

## 2022-01-31 NOTE — ED Provider Notes (Signed)
[facility]    CSN: [id number] Arrival date & time: 01/31/22  1459      History   Chief Complaint Chief Complaint  Patient presents with   Covid Positive   Cough    HPI [name] is a 61 y.o. female.  Presents with positive home COVID test today. Symptoms began 2 days ago Congestion, cough, sore throat. Has been using tylenol  Husband is also COVID-positive. She would like the antiviral treatment  Past Medical History:  Diagnosis Date   Adenomatous colon polyp    Allergy    Asthma 1990's   GERD (gastroesophageal reflux disease)    Headache(784.0)    Palpitations    Pneumonia    Tuberculosis 1990's    Patient Active Problem List   Diagnosis Date Noted   Vitamin D deficiency 12/02/2021   B12 deficiency 12/02/2021   Atherosclerosis of aorta ([place]) 12/02/2021   Androgenic alopecia 02/15/2021   Eosinophilia 12/19/2019   Osteoporosis 08/10/2018   GERD (gastroesophageal reflux disease) 07/11/2017   Annual physical exam 03/20/2016   PCP NOTES >>>>>>>>>>> 02/20/2016   Anxiety state 02/20/2016   Microscopic hematuria 01/11/2016   Nocturia 01/11/2016   Chest pain 03/03/2014   Palpitations 03/03/2014   Asthma 07/28/2009   Constipation, chronic 07/28/2009    Past Surgical History:  Procedure Laterality Date   APPENDECTOMY     COLONOSCOPY  08/31/2009   COLONOSCOPY  12/12/2019   COLPOSCOPY  04/28/2016   LAPAROSCOPIC APPENDECTOMY N/A 06/18/2020   Procedure: APPENDECTOMY LAPAROSCOPIC;  Surgeon: Jesusita Oka, MD;  Location: [facility];  Service: General;  Laterality: N/A;   OVARIAN CYST REMOVAL      OB History   No obstetric history on file.      Home Medications    Prior to Admission medications   Medication Sig Start Date End Date Taking? Authorizing Provider  nirmatrelvir/ritonavir EUA (PAXLOVID) 20 x 150 MG & 10 x '100MG'$  TABS Take 3 tablets by mouth 2 (two) times daily for 5 days. Patient GFR is 110. Take nirmatrelvir (150 mg) two  tablets twice daily for 5 days and ritonavir (100 mg) one tablet twice daily for 5 days. 01/31/22 02/05/22 Yes Bellatrix Devonshire, Vernice Jefferson  aspirin EC 81 MG tablet Take 1 tablet (81 mg total) by mouth daily. Swallow whole. Patient not taking: Reported on 12/02/2021 02/10/21   Early Osmond, MD  atorvastatin (LIPITOR) 40 MG tablet Take 1 tablet (40 mg total) by mouth daily. Patient not taking: Reported on 12/02/2021 02/10/21   Early Osmond, MD  cholecalciferol (VITAMIN D3) 25 MCG (1000 UNIT) tablet Take 1,000 Units by mouth daily.    [provider]  estradiol (ESTRACE) 0.1 MG/GM vaginal cream     [provider]  famotidine (PEPCID) 20 MG tablet TAKE ONE TABLET BY MOUTH ONE TIME A DAY 04/18/21   Thornton Park, MD  ferrous fumarate (FERRETTS) 325 (106 Fe) MG TABS tablet Take 1 tablet (106 mg of iron total) by mouth 2 (two) times daily. 11/28/21   Colon Branch, MD  vitamin B-12 (CYANOCOBALAMIN) 1000 MCG tablet Take 1,000 mcg by mouth daily.    [provider]    Family History Family History  Problem Relation Age of Onset   Hypertension Mother    High Cholesterol Mother    Atrial fibrillation Father        pacemaker   Other Father        blood clotting disorder   Hypertension Brother  Hyperlipidemia Brother    Diabetes Paternal Grandfather    Heart disease Other    Hypertension Other    Hyperlipidemia Other    Hypertension Other    Colon cancer Neg Hx    Breast cancer Neg Hx    Colon polyps Neg Hx     Social History Social History   Tobacco Use   Smoking status: Never   Smokeless tobacco: Never  Vaping Use   Vaping Use: Never used  Substance Use Topics   Alcohol use: Yes    Comment: occasional wine   Drug use: No     Allergies   Clindamycin   Review of Systems Review of Systems  Respiratory:  Positive for cough.   As per HPI  Physical Exam Triage Vital Signs ED Triage Vitals [01/31/22 1614]  Enc Vitals Group     BP 115/80     Pulse  Rate 96     Resp 18     Temp 98 F (36.7 C)     Temp Source Oral     SpO2 98 %     Weight      Height      Head Circumference      Peak Flow      Pain Score 0     Pain Loc      Pain Edu?      Excl. in [place]?    No data found.  Updated Vital Signs BP 115/80 (BP Location: Right Arm)   Pulse 96   Temp 98 F (36.7 C) (Oral)   Resp 18   SpO2 98%    Physical Exam Vitals and nursing note reviewed.  Constitutional:      General: She is not in acute distress.    Appearance: She is not ill-appearing.  HENT:     Right Ear: Tympanic membrane and ear canal normal.     Left Ear: Tympanic membrane and ear canal normal.     Nose: Congestion present.     Mouth/Throat:     Mouth: Mucous membranes are moist.     Pharynx: Uvula midline. No posterior oropharyngeal erythema.     Tonsils: No tonsillar exudate or tonsillar abscesses.  Eyes:     Conjunctiva/sclera: Conjunctivae normal.     Pupils: Pupils are equal, round, and reactive to light.  Cardiovascular:     Rate and Rhythm: Normal rate and regular rhythm.     Heart sounds: Normal heart sounds.  Pulmonary:     Effort: Pulmonary effort is normal. No respiratory distress.     Breath sounds: Normal breath sounds. No wheezing.  Musculoskeletal:     Cervical back: Normal range of motion.  Lymphadenopathy:     Cervical: No cervical adenopathy.  Neurological:     Mental Status: She is alert and oriented to person, place, and time.      UC Treatments / Results  Labs (all labs ordered are listed, but only abnormal results are displayed) Labs Reviewed - No data to display  EKG  Radiology No results found.  Procedures Procedures   Medications Ordered in UC Medications - No data to display  Initial Impression / Assessment and Plan / UC Course  I have reviewed the triage vital signs and the nursing notes.  Pertinent labs & imaging results that were available during my care of the patient were reviewed by me and considered  in my medical decision making (see chart for details).  Well-appearing, stable vitals. She had 3 positive home  tests, will defer PCR testing today. Sent Paxlovid - chart review shows last GFR 110 in sept. Patient has taken before and understands the dosing. Work note provided. Return precautions discussed. Patient agrees to plan  Final Clinical Impressions(s) / UC Diagnoses   Final diagnoses:  Positive self-administered antigen test for COVID-19     Discharge Instructions      I have sent the antivirals to your pharmacy. Instructions on how to take them are on the packaging.    ED Prescriptions     Medication Sig Dispense Auth. Provider   nirmatrelvir/ritonavir EUA (PAXLOVID) 20 x 150 MG & 10 x '100MG'$  TABS Take 3 tablets by mouth 2 (two) times daily for 5 days. Patient GFR is 110. Take nirmatrelvir (150 mg) two tablets twice daily for 5 days and ritonavir (100 mg) one tablet twice daily for 5 days. 30 tablet Georges Victorio, Wells Guiles, PA-C      PDMP not reviewed this encounter.   [place], [place] 01/31/22 1654

## 2022-01-31 NOTE — ED Triage Notes (Signed)
Pt c/o cough and sneezing since Sunday. States had a positive home COVID test. States her spouse is COVID positive. States want the antiviral meds.

## 2022-05-04 ENCOUNTER — Ambulatory Visit (HOSPITAL_COMMUNITY)
Admission: EM | Admit: 2022-05-04 | Discharge: 2022-05-04 | Disposition: A | Discharge status: Home / Self Care | Payer: 59 | Attending: Family Medicine | Admitting: Family Medicine

## 2022-05-04 ENCOUNTER — Encounter (HOSPITAL_COMMUNITY): Payer: Self-pay | Admitting: Emergency Medicine

## 2022-05-04 ENCOUNTER — Telehealth: Payer: Self-pay | Admitting: Gastroenterology

## 2022-05-04 ENCOUNTER — Other Ambulatory Visit: Payer: Self-pay | Admitting: Gastroenterology

## 2022-05-04 ENCOUNTER — Other Ambulatory Visit: Payer: Self-pay

## 2022-05-04 DIAGNOSIS — H8110 Benign paroxysmal vertigo, unspecified ear: Secondary | ICD-10-CM | POA: Diagnosis not present

## 2022-05-04 DIAGNOSIS — I499 Cardiac arrhythmia, unspecified: Secondary | ICD-10-CM | POA: Diagnosis not present

## 2022-05-04 DIAGNOSIS — K219 Gastro-esophageal reflux disease without esophagitis: Secondary | ICD-10-CM

## 2022-05-04 MED ORDER — MECLIZINE HCL 25 MG PO TABS: 25.0000 mg | ORAL_TABLET | Freq: Three times a day (TID) | ORAL | 0 refills | Status: DC | PRN

## 2022-05-04 MED ORDER — ONDANSETRON HCL 4 MG PO TABS: 4.0000 mg | ORAL_TABLET | Freq: Four times a day (QID) | ORAL | 0 refills | Status: DC

## 2022-05-04 MED ORDER — OMEPRAZOLE 20 MG PO CPDR: 20.0000 mg | DELAYED_RELEASE_CAPSULE | Freq: Every day | ORAL | 0 refills | Status: DC

## 2022-05-04 NOTE — ED Triage Notes (Signed)
Woke around 4 am with headache and dizziness.  Reports room spinning, tried to take a shower.  But this was impossible.  Patient did vomit.  Denies ear pain, but points to an area behind right ear that is painful and feels as though something is pulling.    Patient has reflux.  Patient has not taken reflux medicine.  Patient is having the same symptoms as in the past for reflux.  Center epigastric area is bothering her.    Patient has had tylenol

## 2022-05-04 NOTE — ED Provider Notes (Signed)
[facility]    CSN: [id number] Arrival date & time: 05/04/22  [id number]      History   Chief Complaint No chief complaint on file.   HPI [name] is a 62 y.o. female.   Patient is here for several issues.  She woke up today with dizziness, spinning sensation.  Worse with rolling over and turning her head side to side.  She tried to get up to take a shower and had more severe dizziness.  She had an episode of vomiting while in the shower.  She has had some dizziness off/on, but not too bad, and today was much worse.  She feels a "pulling sensation" in her right neck/behind the right ear.    She is also having some epigastric pain this week.  She used to be on a medication for this, but she stopped that.         Past Medical History:  Diagnosis Date   Adenomatous colon polyp    Allergy    Asthma 1990's   GERD (gastroesophageal reflux disease)    Headache(784.0)    Palpitations    Pneumonia    Tuberculosis 1990's    Patient Active Problem List   Diagnosis Date Noted   Vitamin D deficiency 12/02/2021   B12 deficiency 12/02/2021   Atherosclerosis of aorta ([place]) 12/02/2021   Androgenic alopecia 02/15/2021   Eosinophilia 12/19/2019   Osteoporosis 08/10/2018   GERD (gastroesophageal reflux disease) 07/11/2017   Annual physical exam 03/20/2016   PCP NOTES >>>>>>>>>>> 02/20/2016   Anxiety state 02/20/2016   Microscopic hematuria 01/11/2016   Nocturia 01/11/2016   Chest pain 03/03/2014   Palpitations 03/03/2014   Asthma 07/28/2009   Constipation, chronic 07/28/2009    Past Surgical History:  Procedure Laterality Date   APPENDECTOMY     COLONOSCOPY  08/31/2009   COLONOSCOPY  12/12/2019   COLPOSCOPY  04/28/2016   LAPAROSCOPIC APPENDECTOMY N/A 06/18/2020   Procedure: APPENDECTOMY LAPAROSCOPIC;  Surgeon: Jesusita Oka, MD;  Location: [facility];  Service: General;  Laterality: N/A;   OVARIAN CYST REMOVAL      OB History   No obstetric history on  file.      Home Medications    Prior to Admission medications   Medication Sig Start Date End Date Taking? Authorizing Provider  aspirin EC 81 MG tablet Take 1 tablet (81 mg total) by mouth daily. Swallow whole. Patient not taking: Reported on 12/02/2021 02/10/21   Early Osmond, MD  atorvastatin (LIPITOR) 40 MG tablet Take 1 tablet (40 mg total) by mouth daily. Patient not taking: Reported on 12/02/2021 02/10/21   Early Osmond, MD  cholecalciferol (VITAMIN D3) 25 MCG (1000 UNIT) tablet Take 1,000 Units by mouth daily.    [provider]  estradiol (ESTRACE) 0.1 MG/GM vaginal cream     [provider]  famotidine (PEPCID) 20 MG tablet TAKE ONE TABLET BY MOUTH ONE TIME A DAY Patient not taking: Reported on 05/04/2022 04/18/21   Thornton Park, MD  ferrous fumarate (FERRETTS) 325 (106 Fe) MG TABS tablet Take 1 tablet (106 mg of iron total) by mouth 2 (two) times daily. 11/28/21   Colon Branch, MD  vitamin B-12 (CYANOCOBALAMIN) 1000 MCG tablet Take 1,000 mcg by mouth daily.    [provider]    Family History Family History  Problem Relation Age of Onset   Hypertension Mother    High Cholesterol Mother    Atrial fibrillation Father  pacemaker   Other Father        blood clotting disorder   Hypertension Brother    Hyperlipidemia Brother    Diabetes Paternal Grandfather    Heart disease Other    Hypertension Other    Hyperlipidemia Other    Hypertension Other    Colon cancer Neg Hx    Breast cancer Neg Hx    Colon polyps Neg Hx     Social History Social History   Tobacco Use   Smoking status: Never   Smokeless tobacco: Never  Vaping Use   Vaping Use: Never used  Substance Use Topics   Alcohol use: Yes    Comment: occasional wine   Drug use: No     Allergies   Clindamycin   Review of Systems Review of Systems  Constitutional:  Positive for chills. Negative for fever.  HENT: Negative.    Respiratory: Negative.     Cardiovascular: Negative.   Gastrointestinal:  Positive for abdominal pain and vomiting.  Genitourinary: Negative.   Musculoskeletal: Negative.   Neurological:  Positive for dizziness.     Physical Exam Triage Vital Signs ED Triage Vitals  Enc Vitals Group     BP 05/04/22 1126 98/60     Pulse Rate 05/04/22 1126 70     Resp 05/04/22 1126 18     Temp 05/04/22 1126 98 F (36.7 C)     Temp Source 05/04/22 1126 Oral     SpO2 05/04/22 1126 98 %     Weight --      Height --      Head Circumference --      Peak Flow --      Pain Score 05/04/22 1123 7     Pain Loc --      Pain Edu? --      Excl. in [place]? --    No data found.  Updated Vital Signs BP 98/60 (BP Location: Right Arm)   Pulse 70   Temp 98 F (36.7 C) (Oral)   Resp 18   SpO2 98%   Visual Acuity Right Eye Distance:   Left Eye Distance:   Bilateral Distance:    Right Eye Near:   Left Eye Near:    Bilateral Near:     Physical Exam Constitutional:      Appearance: Normal appearance.  HENT:     Nose: No congestion or rhinorrhea.     Mouth/Throat:     Mouth: Mucous membranes are moist.  Cardiovascular:     Rate and Rhythm: Normal rate. Rhythm irregular.  Pulmonary:     Effort: Pulmonary effort is normal.     Breath sounds: Normal breath sounds.  Abdominal:     Palpations: Abdomen is soft.     Tenderness: There is no abdominal tenderness. There is no guarding or rebound.  Musculoskeletal:     Cervical back: Normal range of motion and neck supple. No tenderness.  Lymphadenopathy:     Cervical: No cervical adenopathy.  Skin:    General: Skin is warm.  Neurological:     General: No focal deficit present.     Mental Status: She is alert and oriented to person, place, and time.     Motor: No weakness.     Gait: Gait normal.     Comments: Dix halpike positive bilaterally  Psychiatric:        Mood and Affect: Mood normal.      UC Treatments / Results  Labs (all labs ordered  are listed, but only  abnormal results are displayed) Labs Reviewed - No data to display  EKG NSR; normal EKG;  no changes from previous  Radiology No results found.  Procedures Procedures (including critical care time)  Medications Ordered in UC Medications - No data to display  Initial Impression / Assessment and Plan / UC Course  I have reviewed the triage vital signs and the nursing notes.  Pertinent labs & imaging results that were available during my care of the patient were reviewed by me and considered in my medical decision making (see chart for details).   Final Clinical Impressions(s) / UC Diagnoses   Final diagnoses:  Benign paroxysmal positional vertigo, unspecified laterality  Gastroesophageal reflux disease, unspecified whether esophagitis present  Irregular heart beat     Discharge Instructions      You were seen today for dizziness.  Your symptoms are consistent with vertigo.  I have given you some information on this today.  I have sent out a medication to help with dizziness and vomiting.  I recommend you get plenty of rest and fluids.  This should improve over the next week or so.   If this does not improve then follow up with your doctor.  I have sent out a medication for the abdominal pain.  Take this daily.  Your EKG was normal today.  No concern at this time.     ED Prescriptions     Medication Sig Dispense Auth. Provider   omeprazole (PRILOSEC) 20 MG capsule Take 1 capsule (20 mg total) by mouth daily. 30 capsule Abanoub Hanken, MD   meclizine (ANTIVERT) 25 MG tablet Take 1 tablet (25 mg total) by mouth 3 (three) times daily as needed for dizziness. 30 tablet Khaleed Holan, MD   ondansetron (ZOFRAN) 4 MG tablet Take 1 tablet (4 mg total) by mouth every 6 (six) hours. 12 tablet Rondel Oh, MD      PDMP not reviewed this encounter.   Rondel Oh, MD 05/04/22 1209

## 2022-05-04 NOTE — Telephone Encounter (Signed)
Inbound call from patients husband stating patient needs refill for Pepcid. Please advise.

## 2022-05-04 NOTE — Discharge Instructions (Addendum)
You were seen today for dizziness.  Your symptoms are consistent with vertigo.  I have given you some information on this today.  I have sent out a medication to help with dizziness and vomiting.  I recommend you get plenty of rest and fluids.  This should improve over the next week or so.   If this does not improve then follow up with your doctor.  I have sent out a medication for the abdominal pain.  Take this daily.  Your EKG was normal today.  No concern at this time.

## 2022-05-05 MED ORDER — FAMOTIDINE 20 MG PO TABS: 20.0000 mg | ORAL_TABLET | Freq: Two times a day (BID) | ORAL | 1 refills | Status: AC

## 2022-05-05 NOTE — Telephone Encounter (Signed)
Inbound call from patient's husband, is requesting an update on medication. Please advise.

## 2022-05-05 NOTE — Telephone Encounter (Signed)
Script sent to pharmacy.

## 2022-05-10 ENCOUNTER — Ambulatory Visit (INDEPENDENT_AMBULATORY_CARE_PROVIDER_SITE_OTHER): Payer: 59 | Admitting: Internal Medicine

## 2022-05-10 ENCOUNTER — Encounter: Payer: Self-pay | Admitting: Internal Medicine

## 2022-05-10 ENCOUNTER — Ambulatory Visit (HOSPITAL_BASED_OUTPATIENT_CLINIC_OR_DEPARTMENT_OTHER)
Admission: RE | Admit: 2022-05-10 | Discharge: 2022-05-10 | Disposition: A | Discharge status: Home / Self Care | Payer: 59 | Source: Ambulatory Visit | Attending: Internal Medicine | Admitting: Internal Medicine

## 2022-05-10 VITALS — BP 126/68 | HR 72 | Temp 98.2°F | Resp 16 | Ht 60.0 in | Wt 117.4 lb

## 2022-05-10 DIAGNOSIS — I499 Cardiac arrhythmia, unspecified: Secondary | ICD-10-CM

## 2022-05-10 DIAGNOSIS — R519 Headache, unspecified: Secondary | ICD-10-CM

## 2022-05-10 DIAGNOSIS — E785 Hyperlipidemia, unspecified: Secondary | ICD-10-CM

## 2022-05-10 NOTE — Patient Instructions (Addendum)
Drink plenty fluids  If you have a headache take some Tylenol.  If the headache is severe, different, or you have any other symptoms such as double vision, slurred speech, neck stiffness: Go to the ER,  let me know  GO TO THE LAB : Get the blood work      STOP BY THE FIRST FLOOR: Arrange for CT

## 2022-05-10 NOTE — Progress Notes (Unsigned)
Subjective:    Patient ID: [name], female    DOB: [birth year], 62 y.o.   MRN: [id number]  DOS:  05/10/2022 Type of visit - description: Acute  Develop a severe dizziness while she was taking a shower. Associated with nausea but no vomiting. Since then, still has some dizziness the last few seconds mostly when she turns her head. Denies any fever or chills. No recent sinus infection.  Is also concerned about a headache that she is having for about a week. She typically does not have headaches, intensity is 5/10, I ask if this is the worst headache of her life and she said yes. Patient is located at the front and top of the head. No associated diplopia, slurred speech, motor deficit. No recent fall or injury. She admits to some stress.  Went to the urgent care 05/04/2022, Dx with positional vertigo.  Was also told that she had "irregular heartbeat" and the EKG was done but I cannot see the results. She denies chest pain, palpitations.  Review of Systems See above   Past Medical History:  Diagnosis Date   Adenomatous colon polyp    Allergy    Asthma 1990's   GERD (gastroesophageal reflux disease)    Headache(784.0)    Palpitations    Pneumonia    Tuberculosis 1990's    Past Surgical History:  Procedure Laterality Date   APPENDECTOMY     COLONOSCOPY  08/31/2009   COLONOSCOPY  12/12/2019   COLPOSCOPY  04/28/2016   LAPAROSCOPIC APPENDECTOMY N/A 06/18/2020   Procedure: APPENDECTOMY LAPAROSCOPIC;  Surgeon: Jesusita Oka, MD;  Location: [facility];  Service: General;  Laterality: N/A;   OVARIAN CYST REMOVAL      Current Outpatient Medications  Medication Instructions   atorvastatin (LIPITOR) 40 mg, Oral, Daily   cholecalciferol (VITAMIN D3) 1,000 Units, Oral, Daily   cyanocobalamin (VITAMIN B12) 1,000 mcg, Oral, Daily   estradiol (ESTRACE) 0.1 MG/GM vaginal cream    famotidine (PEPCID) 20 mg, Oral, 2 times daily   ferrous fumarate (FERRETTS) 325 (106 Fe) MG TABS  tablet 106 mg of iron, Oral, 2 times daily   meclizine (ANTIVERT) 25 mg, Oral, 3 times daily PRN   omeprazole (PRILOSEC) 20 mg, Oral, Daily   ondansetron (ZOFRAN) 4 mg, Oral, Every 6 hours       Objective:   Physical Exam BP 126/68   Pulse 72   Temp 98.2 F (36.8 C) (Oral)   Resp 16   Ht 5' (1.524 m)   Wt 117 lb 6 oz (53.2 kg)   SpO2 97%   BMI 22.92 kg/m  General:   Well developed, NAD, BMI noted. HEENT:  Normocephalic . Face symmetric, atraumatic Neck: Range of motion normal.  Normal carotid pulses Lungs:  CTA B Normal respiratory effort, no intercostal retractions, no accessory muscle use. Heart: RRR,  no murmur.  Lower extremities: no pretibial edema bilaterally  Skin: Not pale. Not jaundice Neurologic:  alert & oriented X3.  Speech normal, gait appropriate for age and unassisted. Motor symmetric.  EOMI.  Pupils equal and reactive.  DTR symmetric Psych--  Cognition and judgment appear intact.  Cooperative with normal attention span and concentration.  Behavior appropriate. No anxious or depressed appearing.      Assessment     ASSESSMENT  (new patient, 02/2016) Chest pain, dizziness: Normal ETT echo and event monitor 2011. ETT again 04-2014: Normal, good exercise capacity Hematuria: per patient previous urology w/u (-). Dr McDermoth Menopause since ~ age  52 H/o hair  loss, saw dermatology, biopsy done. IDA-GERD-Dyspepsia- cholelithiasis  : rx bentyl - Colonoscopy 2021, EGD 2022 - Celiac work-up negative - Saw hematology April 2023 Osteoporosis: T score -2.9 07/2017 Vit D def 2014 History of tuberculosis in her 36s. Asthma, eosinophilia: Seen by pulmonology 12-2019  PLAN:  Dizziness: As described above, suspect benign etiology.  Recommend observation for now.  If severe symptoms let me know Headache: Although the headache is only 5/10 she reports this is the worst headache of her life.  No fever, no recent injury.  Neurological exam benign.  Plan: CT head  today.  If negative recommend to manage with OTCs. Could be stress related or a mild migraine. Irregular heartbeat: Patient is quite distraught when she heard her heart was irregular when she went to urgent care.  No chest pain, no palpitations, exam is normal today.  EKG today: High cholesterol: Declines to take atorvastatin, has been working on diet, request FLP.  Will do.          = Here for CPX Cardiovascular: Aortic sclerosis found on CT 06/2020 Calcium coronary score 0 on 02/2021 Saw cardiology at [facility] 05/10/2021, apparently no statins were recommended. Saw cards Dr Ali Lowe June 2023, was rx  aspirin and statin due to aortic sclerosis. For NSVT continue BBs Decided not to  take aspirin or atorvastatin, not on BBs; explained the benefits of medication in light of aortic sclerosis despite a negative calcium coronary score.  The patient is not sure if she will proceed.  Again benefits discussed. Osteoporosis: Reports was advised to stop Boniva by endocrinology. Vitamin D deficiency: On supplements Iron deficiency anemia, GERD Last visit with GI 12-2020 Colonoscopy 2021 EGD September 2022, normal esophagus, erosive gastropathy with no recent bleeding.  Biopsies taken.  Normal duodenum, no H. pylori, esophagus with increased intraepithelial lymphocytes and rare eosinophils Saw Hematology April 2023: Continue with iron.  Last level satisfactory, patient wonders if she needs to cont iron.  Plan is to recheck levels at the next visit Currently on Pepcid only decided not to take PPIs. B12 deficiency: On supplements.  Recheck on RTC RTC 6 months   In addition to CPX, we discussed all her ch

## 2022-05-11 LAB — LIPID PANEL
Chol/HDL Ratio: 2.9 ratio (ref 0.0–4.4)
Cholesterol, Total: 227 mg/dL — ABNORMAL HIGH (ref 100–199)
HDL: 77 mg/dL (ref >39)
LDL Chol Calc (NIH): 137 mg/dL — ABNORMAL HIGH (ref 0–99)
Triglycerides: 78 mg/dL (ref 0–149)
VLDL Cholesterol Cal: 13 mg/dL (ref 5–40)

## 2022-05-11 NOTE — Assessment & Plan Note (Signed)
Dizziness: As described above, suspect benign etiology.  Recommend observation for now.  If severe symptoms let me know Headache: Although the headache is only 5/10 she reports this is the worst headache of her life.  No fever, no recent injury.  Neurological exam benign.  Plan: CT head today.  If negative recommend to manage with OTCs. Could be stress related or a mild migraine. Irregular heartbeat ?: Patient is quite distressed  when she was told at [facility]  her heart was irregular.  No chest pain, no palpitations, exam is normal today.  EKG today:no acute ; rec observation High cholesterol: Declines to take atorvastatin, has been working on diet, request FLP.  Will do.

## 2022-06-02 ENCOUNTER — Ambulatory Visit: Payer: 59 | Admitting: Internal Medicine

## 2022-06-30 ENCOUNTER — Ambulatory Visit (INDEPENDENT_AMBULATORY_CARE_PROVIDER_SITE_OTHER): Payer: 59 | Admitting: Internal Medicine

## 2022-06-30 ENCOUNTER — Encounter: Payer: Self-pay | Admitting: Internal Medicine

## 2022-06-30 VITALS — BP 116/68 | HR 60 | Temp 97.6°F | Resp 16 | Ht 60.0 in | Wt 115.0 lb

## 2022-06-30 DIAGNOSIS — E538 Deficiency of other specified B group vitamins: Secondary | ICD-10-CM

## 2022-06-30 NOTE — Progress Notes (Unsigned)
Patient is scheduled for a physical but she is not due for it. I offered  to continue w/ a  routine checkup but she has no concerns consequently the visit was canceled

## 2022-06-30 NOTE — Patient Instructions (Signed)
Vaccines I recommend:  Covid booster Shingrix (shingles)   

## 2022-09-01 ENCOUNTER — Encounter: Payer: Self-pay | Admitting: Internal Medicine

## 2022-09-06 MED ORDER — RIFAXIMIN 200 MG PO TABS: 200.0000 mg | ORAL_TABLET | Freq: Three times a day (TID) | ORAL | 0 refills | Status: DC

## 2022-11-03 ENCOUNTER — Telehealth: Payer: Self-pay | Admitting: Internal Medicine

## 2022-11-03 DIAGNOSIS — Z0000 Encounter for general adult medical examination without abnormal findings: Secondary | ICD-10-CM

## 2022-11-03 DIAGNOSIS — E538 Deficiency of other specified B group vitamins: Secondary | ICD-10-CM

## 2022-11-03 DIAGNOSIS — D509 Iron deficiency anemia, unspecified: Secondary | ICD-10-CM

## 2022-11-03 DIAGNOSIS — E785 Hyperlipidemia, unspecified: Secondary | ICD-10-CM

## 2022-11-03 DIAGNOSIS — E559 Vitamin D deficiency, unspecified: Secondary | ICD-10-CM

## 2022-11-03 NOTE — Telephone Encounter (Signed)
Spoke w/ Christen Bame- Pt's husband- informed that orders as requested has been placed. CBC does not include Vit D, B12 or Iron but that we placed orders for that. Ronnie verbalized understanding.

## 2022-11-03 NOTE — Telephone Encounter (Signed)
Please advise? Pt is scheduled for 01/10/23 for cpx and scheduled for lab on 01/05/23 for lab, however there are no labs placed yet. Can you advise lab orders including the one's husband requests and I can return his call? Thank you.

## 2022-11-03 NOTE — Telephone Encounter (Signed)
Pt's spouse called to advise that they would like to get the following things checked when they come in for pt's physical: Iron, Vit D3, B12, and CBC. He is not sure what's included in the CBC panel. Please call to advise if the iron, b12 or vit d3 is included in the CBC or if these would be in addition to the CBC. Please place orders for physical labs.

## 2022-11-03 NOTE — Telephone Encounter (Signed)
CMP, FLP, CBC, B12.

## 2022-11-03 NOTE — Addendum Note (Signed)
Addended by: Conrad Enterprise D on: 11/03/2022 03:42 PM   Modules accepted: Orders

## 2022-11-03 NOTE — Telephone Encounter (Signed)
Orders placed. Per PCP-okay to order Vit D and ibc panel

## 2022-11-29 ENCOUNTER — Encounter (HOSPITAL_COMMUNITY): Payer: Self-pay | Admitting: Emergency Medicine

## 2022-11-29 ENCOUNTER — Ambulatory Visit (HOSPITAL_COMMUNITY)
Admission: EM | Admit: 2022-11-29 | Discharge: 2022-11-29 | Disposition: A | Discharge status: Home / Self Care | Payer: 59 | Attending: Physician Assistant | Admitting: Physician Assistant

## 2022-11-29 DIAGNOSIS — U071 COVID-19: Secondary | ICD-10-CM | POA: Diagnosis not present

## 2022-11-29 DIAGNOSIS — R051 Acute cough: Secondary | ICD-10-CM | POA: Insufficient documentation

## 2022-11-29 LAB — BASIC METABOLIC PANEL
Anion gap: 7 (ref 5–15)
BUN: 7 mg/dL — ABNORMAL LOW (ref 8–23)
CO2: 24 mmol/L (ref 22–32)
Calcium: 8.6 mg/dL — ABNORMAL LOW (ref 8.9–10.3)
Chloride: 106 mmol/L (ref 98–111)
Creatinine, Ser: 0.56 mg/dL (ref 0.44–1.00)
GFR, Estimated: >60 mL/min (ref >60)
Glucose, Bld: 89 mg/dL (ref 70–99)
Potassium: 3.3 mmol/L — ABNORMAL LOW (ref 3.5–5.1)
Sodium: 137 mmol/L (ref 135–145)

## 2022-11-29 MED ORDER — PAXLOVID (300/100) 20 X 150 MG & 10 X 100MG PO TBPK: 3.0000 | ORAL_TABLET | Freq: Two times a day (BID) | ORAL | 0 refills | Status: AC

## 2022-11-29 MED ORDER — PROMETHAZINE-DM 6.25-15 MG/5ML PO SYRP: 5.0000 mL | ORAL_SOLUTION | Freq: Two times a day (BID) | ORAL | 0 refills | Status: DC | PRN

## 2022-11-29 NOTE — ED Triage Notes (Signed)
Pt started having cough, congestion and headache last Wed/Thursday. Symptoms got worse on Monday. Home covid test was positive yesterday. Took Tylenol.

## 2022-11-29 NOTE — Discharge Instructions (Signed)
We are treating you for COVID with Paxlovid.  Please take this twice daily for 5 days.  I will contact you if your blood work is abnormal and we need to stop or change this medication.  Use over-the-counter medications as needed including Tylenol, Mucinex, Flonase.  Ensure that you are resting and drinking plenty of fluid.  You can take Promethazine DM for cough.  This will make you sleepy so do not drive or drink alcohol while taking it.  If your symptoms are not improving within a week please return for reevaluation.  If anything worsens and you have high fever, worsening cough, shortness of breath, nausea/vomiting interfering with oral intake, weakness you need to be seen immediately.

## 2022-11-29 NOTE — ED Provider Notes (Signed)
[facility]    CSN: [id number] Arrival date & time: 11/29/22  7829      History   Chief Complaint Chief Complaint  Patient presents with   Cough   Headache   Covid Positive    HPI [name] is a 62 y.o. female.   Patient reports that she was not feeling well last week but then on Monday (11/27/2022) she developed URI symptoms including cough, congestion, headache, body aches, sore throat.  She denies any chest pain, shortness of breath, fever, nausea, vomiting.  She took an at home COVID test that was positive.  Denies any known sick contacts but does work at [employer] so is exposed to many people.  She has had COVID with last episode November 2023.  She is up-to-date on COVID-19 vaccinations.  She has been using Tylenol without improvement of symptoms.  She does have a history of asthma but has not required albuterol regularly since she moved from [place].  Denies hospitalization for asthma in the past.  She is postmenopausal and has no concern for pregnancy.  Denies any recent antibiotics or steroids.  She has taken Paxlovid in the past and is interested in starting this medication.    Past Medical History:  Diagnosis Date   Adenomatous colon polyp    Allergy    Asthma 1990's   GERD (gastroesophageal reflux disease)    Headache(784.0)    Palpitations    Pneumonia    Tuberculosis 1990's    Patient Active Problem List   Diagnosis Date Noted   Vitamin D deficiency 12/02/2021   B12 deficiency 12/02/2021   Atherosclerosis of aorta (HCC) 12/02/2021   Androgenic alopecia 02/15/2021   Eosinophilia 12/19/2019   Osteoporosis 08/10/2018   GERD (gastroesophageal reflux disease) 07/11/2017   Annual physical exam 03/20/2016   PCP NOTES >>>>>>>>>>> 02/20/2016   Anxiety state 02/20/2016   Microscopic hematuria 01/11/2016   Nocturia 01/11/2016   Chest pain 03/03/2014   Palpitations 03/03/2014   Asthma 07/28/2009   Constipation, chronic 07/28/2009    Past  Surgical History:  Procedure Laterality Date   APPENDECTOMY     COLONOSCOPY  08/31/2009   COLONOSCOPY  12/12/2019   COLPOSCOPY  04/28/2016   LAPAROSCOPIC APPENDECTOMY N/A 06/18/2020   Procedure: APPENDECTOMY LAPAROSCOPIC;  Surgeon: Diamantina Monks, MD;  Location: [facility];  Service: General;  Laterality: N/A;   OVARIAN CYST REMOVAL      OB History   No obstetric history on file.      Home Medications    Prior to Admission medications   Medication Sig Start Date End Date Taking? Authorizing Provider  nirmatrelvir & ritonavir (PAXLOVID, 300/100,) 20 x 150 MG & 10 x 100MG  TBPK Take 3 tablets by mouth 2 (two) times daily for 5 days. 11/29/22 12/04/22 Yes Aidian Salomon K, PA-C  promethazine-dextromethorphan (PROMETHAZINE-DM) 6.25-15 MG/5ML syrup Take 5 mLs by mouth 2 (two) times daily as needed for cough. 11/29/22  Yes Jonahtan Manseau, Noberto Retort, PA-C  cholecalciferol (VITAMIN D3) 25 MCG (1000 UNIT) tablet Take 1,000 Units by mouth daily.    [provider]  estradiol (ESTRACE) 0.1 MG/GM vaginal cream     [provider]  famotidine (PEPCID) 20 MG tablet Take 1 tablet (20 mg total) by mouth 2 (two) times daily. 05/05/22   Tressia Danas, MD  ferrous fumarate (FERRETTS) 325 (106 Fe) MG TABS tablet Take 1 tablet (106 mg of iron total) by mouth 2 (two) times daily. 11/28/21   Wanda Plump,  MD  meclizine (ANTIVERT) 25 MG tablet Take 1 tablet (25 mg total) by mouth 3 (three) times daily as needed for dizziness. Patient not taking: Reported on 06/30/2022 05/04/22   Jannifer Franklin, MD  omeprazole (PRILOSEC) 20 MG capsule Take 1 capsule (20 mg total) by mouth daily. 05/04/22   Piontek, Denny Peon, MD  ondansetron (ZOFRAN) 4 MG tablet Take 1 tablet (4 mg total) by mouth every 6 (six) hours. Patient not taking: Reported on 06/30/2022 05/04/22   Jannifer Franklin, MD  rifaximin (XIFAXAN) 200 MG tablet Take 1 tablet (200 mg total) by mouth 3 (three) times daily. Take if you have traveler's diarrhea 09/06/22   Wanda Plump, MD  vitamin B-12 (CYANOCOBALAMIN) 1000 MCG tablet Take 1,000 mcg by mouth daily.    [provider]    Family History Family History  Problem Relation Age of Onset   Hypertension Mother    High Cholesterol Mother    Atrial fibrillation Father        pacemaker   Other Father        blood clotting disorder   Hypertension Brother    Hyperlipidemia Brother    Diabetes Paternal Grandfather    Heart disease Other    Hypertension Other    Hyperlipidemia Other    Hypertension Other    Colon cancer Neg Hx    Breast cancer Neg Hx    Colon polyps Neg Hx     Social History Social History   Tobacco Use   Smoking status: Never   Smokeless tobacco: Never  Vaping Use   Vaping status: Never Used  Substance Use Topics   Alcohol use: Yes    Comment: occasional wine   Drug use: No     Allergies   Clindamycin and Gabapentin   Review of Systems Review of Systems  Constitutional:  Positive for activity change. Negative for appetite change, fatigue and fever.  HENT:  Positive for congestion, postnasal drip, sneezing and sore throat. Negative for sinus pressure.   Respiratory:  Positive for cough. Negative for shortness of breath.   Cardiovascular:  Negative for chest pain.  Gastrointestinal:  Negative for abdominal pain, diarrhea, nausea and vomiting.  Musculoskeletal:  Positive for arthralgias and myalgias.  Neurological:  Positive for headaches. Negative for dizziness and light-headedness.     Physical Exam Triage Vital Signs ED Triage Vitals  Encounter Vitals Group     BP 11/29/22 1033 106/69     Systolic BP Percentile --      Diastolic BP Percentile --      Pulse Rate 11/29/22 1033 69     Resp 11/29/22 1033 18     Temp 11/29/22 1033 98.3 F (36.8 C)     Temp Source 11/29/22 1033 Oral     SpO2 11/29/22 1033 97 %     Weight --      Height --      Head Circumference --      Peak Flow --      Pain Score 11/29/22 1031 4     Pain Loc --      Pain Education  --      Exclude from Growth Chart --    No data found.  Updated Vital Signs BP 106/69 (BP Location: Right Arm)   Pulse 69   Temp 98.3 F (36.8 C) (Oral)   Resp 18   SpO2 97%   Visual Acuity Right Eye Distance:   Left Eye Distance:   Bilateral Distance:  Right Eye Near:   Left Eye Near:    Bilateral Near:     Physical Exam Vitals reviewed.  Constitutional:      General: She is awake. She is not in acute distress.    Appearance: Normal appearance. She is well-developed. She is not ill-appearing.     Comments: Very pleasant female appears stated age in no acute distress sitting comfortably in exam room  HENT:     Head: Normocephalic and atraumatic.     Right Ear: Tympanic membrane, ear canal and external ear normal. Tympanic membrane is not erythematous or bulging.     Left Ear: Tympanic membrane, ear canal and external ear normal. Tympanic membrane is not erythematous or bulging.     Nose:     Right Sinus: Maxillary sinus tenderness present. No frontal sinus tenderness.     Left Sinus: Maxillary sinus tenderness present. No frontal sinus tenderness.     Mouth/Throat:     Pharynx: Uvula midline. Posterior oropharyngeal erythema and postnasal drip present. No oropharyngeal exudate.  Cardiovascular:     Rate and Rhythm: Normal rate and regular rhythm.     Heart sounds: Normal heart sounds, S1 normal and S2 normal. No murmur heard. Pulmonary:     Effort: Pulmonary effort is normal.     Breath sounds: Normal breath sounds. No wheezing, rhonchi or rales.     Comments: Clear to auscultation bilaterally Psychiatric:        Behavior: Behavior is cooperative.      UC Treatments / Results  Labs (all labs ordered are listed, but only abnormal results are displayed) Labs Reviewed  BASIC METABOLIC PANEL    EKG   Radiology No results found.  Procedures Procedures (including critical care time)  Medications Ordered in UC Medications - No data to display  Initial  Impression / Assessment and Plan / UC Course  I have reviewed the triage vital signs and the nursing notes.  Pertinent labs & imaging results that were available during my care of the patient were reviewed by me and considered in my medical decision making (see chart for details).     Patient is well-appearing, afebrile, nontoxic, nontachycardic.  No evidence of acute infection on physical exam that would warrant initiation of antibiotics.  She had a positive at-home COVID test we discussed this is the likely cause of her symptoms.  She is within the 5 days of symptom onset and a candidate for antiviral therapy given her age and history of asthma.  Her last metabolic panel was obtained at 11/28/2021 with creatinine of 0.52 and EGFR of 106 mL/min.  We discussed that this should be updated since it has been a year and so BMP was obtained today.  She has no history of chronic kidney disease so we will start Paxlovid without renal dosing and obtain BMP.  If this is abnormal we will contact patient for additional recommendations.  She is to use over-the-counter medications as needed and was prescribed Promethazine DM for cough.  We discussed this can be sedating and she should not drive or drink alcohol while taking it.  She is to rest and drink plenty of fluid.  If her symptoms or not improving within a week she is to return for reevaluation.  If anything worsens she needs to be seen immediately.  Strict return precautions given.  Work excuse note provided.  Final Clinical Impressions(s) / UC Diagnoses   Final diagnoses:  COVID-19  Acute cough     Discharge  Instructions      We are treating you for COVID with Paxlovid.  Please take this twice daily for 5 days.  I will contact you if your blood work is abnormal and we need to stop or change this medication.  Use over-the-counter medications as needed including Tylenol, Mucinex, Flonase.  Ensure that you are resting and drinking plenty of fluid.  You  can take Promethazine DM for cough.  This will make you sleepy so do not drive or drink alcohol while taking it.  If your symptoms are not improving within a week please return for reevaluation.  If anything worsens and you have high fever, worsening cough, shortness of breath, nausea/vomiting interfering with oral intake, weakness you need to be seen immediately.     ED Prescriptions     Medication Sig Dispense Auth. Provider   nirmatrelvir & ritonavir (PAXLOVID, 300/100,) 20 x 150 MG & 10 x 100MG  TBPK Take 3 tablets by mouth 2 (two) times daily for 5 days. 30 tablet Caitlinn Klinker K, PA-C   promethazine-dextromethorphan (PROMETHAZINE-DM) 6.25-15 MG/5ML syrup Take 5 mLs by mouth 2 (two) times daily as needed for cough. 118 mL Yarianna Varble K, PA-C      PDMP not reviewed this encounter.   Jeani Hawking, PA-C 11/29/22 1056

## 2022-12-08 ENCOUNTER — Encounter: Payer: 59 | Admitting: Internal Medicine

## 2023-01-05 ENCOUNTER — Other Ambulatory Visit (INDEPENDENT_AMBULATORY_CARE_PROVIDER_SITE_OTHER): Payer: 59

## 2023-01-05 DIAGNOSIS — E559 Vitamin D deficiency, unspecified: Secondary | ICD-10-CM

## 2023-01-05 DIAGNOSIS — E785 Hyperlipidemia, unspecified: Secondary | ICD-10-CM

## 2023-01-05 DIAGNOSIS — D509 Iron deficiency anemia, unspecified: Secondary | ICD-10-CM | POA: Diagnosis not present

## 2023-01-05 DIAGNOSIS — E538 Deficiency of other specified B group vitamins: Secondary | ICD-10-CM

## 2023-01-05 NOTE — Addendum Note (Signed)
Addended by: Mervin Kung A on: 01/05/2023 08:11 AM   Modules accepted: Orders

## 2023-01-06 LAB — COMPREHENSIVE METABOLIC PANEL
ALT: 13 [IU]/L (ref 0–32)
AST: 16 [IU]/L (ref 0–40)
Albumin: 4.1 g/dL (ref 3.9–4.9)
Alkaline Phosphatase: 86 [IU]/L (ref 44–121)
BUN/Creatinine Ratio: 32 — ABNORMAL HIGH (ref 12–28)
BUN: 16 mg/dL (ref 8–27)
Bilirubin Total: 0.4 mg/dL (ref 0.0–1.2)
CO2: 24 mmol/L (ref 20–29)
Calcium: 8.7 mg/dL (ref 8.7–10.3)
Chloride: 103 mmol/L (ref 96–106)
Creatinine, Ser: 0.5 mg/dL — ABNORMAL LOW (ref 0.57–1.00)
Globulin, Total: 2.4 g/dL (ref 1.5–4.5)
Glucose: 86 mg/dL (ref 70–99)
Potassium: 3.9 mmol/L (ref 3.5–5.2)
Sodium: 139 mmol/L (ref 134–144)
Total Protein: 6.5 g/dL (ref 6.0–8.5)
eGFR: 106 mL/min/{1.73_m2} (ref >59)

## 2023-01-06 LAB — CBC WITH DIFFERENTIAL/PLATELET
Basophils Absolute: 0.1 10*3/uL (ref 0.0–0.2)
Basos: 1 %
EOS (ABSOLUTE): 0.6 10*3/uL — ABNORMAL HIGH (ref 0.0–0.4)
Eos: 13 %
Hematocrit: 37.7 % (ref 34.0–46.6)
Hemoglobin: 12.4 g/dL (ref 11.1–15.9)
Immature Grans (Abs): 0 10*3/uL (ref 0.0–0.1)
Immature Granulocytes: 0 %
Lymphocytes Absolute: 1.5 10*3/uL (ref 0.7–3.1)
Lymphs: 33 %
MCH: 31.1 pg (ref 26.6–33.0)
MCHC: 32.9 g/dL (ref 31.5–35.7)
MCV: 95 fL (ref 79–97)
Monocytes Absolute: 0.3 10*3/uL (ref 0.1–0.9)
Monocytes: 5 %
Neutrophils Absolute: 2.2 10*3/uL (ref 1.4–7.0)
Neutrophils: 48 %
Platelets: 255 10*3/uL (ref 150–450)
RBC: 3.99 x10E6/uL (ref 3.77–5.28)
RDW: 12.8 % (ref 11.7–15.4)
WBC: 4.7 10*3/uL (ref 3.4–10.8)

## 2023-01-06 LAB — B12 AND FOLATE PANEL
Folate: 9 ng/mL (ref >3.0)
Vitamin B-12: 740 pg/mL (ref 232–1245)

## 2023-01-06 LAB — LIPID PANEL
Chol/HDL Ratio: 2.8 ratio (ref 0.0–4.4)
Cholesterol, Total: 200 mg/dL — ABNORMAL HIGH (ref 100–199)
HDL: 71 mg/dL (ref >39)
LDL Chol Calc (NIH): 112 mg/dL — ABNORMAL HIGH (ref 0–99)
Triglycerides: 93 mg/dL (ref 0–149)
VLDL Cholesterol Cal: 17 mg/dL (ref 5–40)

## 2023-01-06 LAB — IRON,TIBC AND FERRITIN PANEL
Ferritin: 50 ng/mL (ref 15–150)
Iron Saturation: 23 % (ref 15–55)
Iron: 64 ug/dL (ref 27–139)
Total Iron Binding Capacity: 282 ug/dL (ref 250–450)
UIBC: 218 ug/dL (ref 118–369)

## 2023-01-06 LAB — VITAMIN D 25 HYDROXY (VIT D DEFICIENCY, FRACTURES): Vit D, 25-Hydroxy: 36.2 ng/mL (ref 30.0–100.0)

## 2023-01-10 ENCOUNTER — Encounter: Payer: Self-pay | Admitting: Internal Medicine

## 2023-01-10 ENCOUNTER — Ambulatory Visit (INDEPENDENT_AMBULATORY_CARE_PROVIDER_SITE_OTHER): Payer: 59 | Admitting: Internal Medicine

## 2023-01-10 VITALS — BP 126/70 | HR 65 | Temp 98.2°F | Resp 16 | Ht 60.0 in | Wt 114.1 lb

## 2023-01-10 DIAGNOSIS — Z0000 Encounter for general adult medical examination without abnormal findings: Secondary | ICD-10-CM | POA: Diagnosis not present

## 2023-01-10 NOTE — Progress Notes (Unsigned)
Subjective:    Patient ID: [name], female    DOB: [birth year], 62 y.o.   MRN: [id number]  DOS:  01/10/2023 Type of visit - description: CPX, here with her husband  Here for CPX. Feels well.  Wt Readings from Last 3 Encounters:  01/10/23 114 lb 2 oz (51.8 kg)  06/30/22 115 lb (52.2 kg)  05/10/22 117 lb 6 oz (53.2 kg)     Review of Systems See above   Past Medical History:  Diagnosis Date   Adenomatous colon polyp    Allergy    Asthma 1990's   GERD (gastroesophageal reflux disease)    Headache(784.0)    Palpitations    Pneumonia    Tuberculosis 1990's    Past Surgical History:  Procedure Laterality Date   APPENDECTOMY     COLONOSCOPY  08/31/2009   COLONOSCOPY  12/12/2019   COLPOSCOPY  04/28/2016   LAPAROSCOPIC APPENDECTOMY N/A 06/18/2020   Procedure: APPENDECTOMY LAPAROSCOPIC;  Surgeon: Diamantina Monks, MD;  Location: [facility];  Service: General;  Laterality: N/A;   OVARIAN CYST REMOVAL      Current Outpatient Medications  Medication Instructions   cholecalciferol (VITAMIN D3) 1,000 Units, Oral, Daily   cyanocobalamin (VITAMIN B12) 1,000 mcg, Oral, Daily   estradiol (ESTRACE) 0.1 MG/GM vaginal cream    famotidine (PEPCID) 20 mg, Oral, 2 times daily   ferrous fumarate (FERRETTS) 325 (106 Fe) MG TABS tablet 106 mg of iron, Oral, 2 times daily   meclizine (ANTIVERT) 25 mg, Oral, 3 times daily PRN   omeprazole (PRILOSEC) 20 mg, Oral, Daily   ondansetron (ZOFRAN) 4 mg, Oral, Every 6 hours   promethazine-dextromethorphan (PROMETHAZINE-DM) 6.25-15 MG/5ML syrup 5 mLs, Oral, 2 times daily PRN   rifaximin (XIFAXAN) 200 mg, Oral, 3 times daily, Take if you have traveler's diarrhea       Objective:   Physical Exam BP 126/70   Pulse 65   Temp 98.2 F (36.8 C) (Oral)   Resp 16   Ht 5' (1.524 m)   Wt 114 lb 2 oz (51.8 kg)   SpO2 97%   BMI 22.29 kg/m  General: Well developed, NAD, BMI noted Neck: No  thyromegaly  HEENT:  Normocephalic . Face symmetric,  atraumatic Lungs:  CTA B Normal respiratory effort, no intercostal retractions, no accessory muscle use. Heart: RRR,  no murmur.  Abdomen:  Not distended, soft, non-tender. No rebound or rigidity.   Lower extremities: no pretibial edema bilaterally  Skin: Exposed areas without rash. Not pale. Not jaundice Neurologic:  alert & oriented X3.  Speech normal, gait appropriate for age and unassisted Strength symmetric and appropriate for age.  Psych: Cognition and judgment appear intact.  Cooperative with normal attention span and concentration.  Behavior appropriate. No anxious or depressed appearing.     Assessment    ASSESSMENT  (new patient, 02/2016) Hematuria: per patient previous urology w/u (-). [name] Menopause since ~ age 66 H/o hair  loss, saw dermatology, biopsy done. IDA-GERD-Dyspepsia- cholelithiasis  : rx bentyl - Colonoscopy 2021, EGD 2022 - Celiac work-up negative - Saw hematology April 2023 Osteoporosis: T score -2.9  07/2017, took boniva, per endo ([name])  Vit D def 2014 Cardiovascular:  Aortic sclerosis found on CT 06/2020;Calcium coronary score 0 on 02/2021  [name] June 2023, was rx  aspirin and statin due to aortic sclerosis. History of tuberculosis in her 30s. Asthma, eosinophilia: Seen by pulmonology 12-2019 Chest pain, dizziness: Normal ETT echo and event monitor 2011.  ETT again 04-2014: Normal, good exercise capacity  PLAN: Here for CPX Td: 2018 - Rec flu shot, covid, shingrix.  Plans to get vaccines at the pharmacy. - Female care: per gyn, PAP and  MMG  01/2022  - CCS- Cscope 2011, [name], C-scope 12-2019, next per GI.   - Labs: All recent labs reviewed. -POA: Discussed Osteoporosis: Per endocrinology.  Not taking Boniva, on a holiday per patient. Vitamin D deficiency: Last levels very good. Aortic sclerosis: Cardiology recommended aspirin and statins, patient is reluctant to take medicines  Fortunately her LDL is trending down due to  her excellent lifestyle but nevertheless she is stressed about the issue.  She could ambulate to take a low-dose of a statin and continue her healthy lifestyle.  She will let me know if that is what she likes to do. RTC 1 year

## 2023-01-10 NOTE — Patient Instructions (Addendum)
Vaccines I recommend: Covid booster Shingrix (shingles) Flu shot    Next visit with me in one year  Please schedule it at the front desk

## 2023-01-11 ENCOUNTER — Encounter: Payer: Self-pay | Admitting: Internal Medicine

## 2023-01-11 NOTE — Assessment & Plan Note (Signed)
Here for CPX -Td: 2018 - Rec flu shot, covid, shingrix.  Plans to get vaccines at the pharmacy. - Female care: per gyn, PAP and  MMG  01/2022  - CCS- Cscope 2011, Dr Arlyce Dice, C-scope 12-2019, next per GI.   - Labs: All recent labs reviewed. -POA: Discussed

## 2023-01-11 NOTE — Assessment & Plan Note (Signed)
Here for CPX Osteoporosis: Per endocrinology.  Not taking Boniva, on a holiday per patient. Vitamin D def: Last levels very good. Aortic sclerosis: Cardiology recommended aspirin and statins, patient is reluctant to take medicines  Fortunately her LDL is trending down due to her excellent lifestyle but nevertheless she is stressed about the issue.  She could take a low-dose of a statin and continue her healthy lifestyle.  She will let me know if that is what she likes to do. RTC 1 year

## 2023-02-23 ENCOUNTER — Ambulatory Visit (INDEPENDENT_AMBULATORY_CARE_PROVIDER_SITE_OTHER): Payer: 59 | Admitting: Podiatry

## 2023-02-23 DIAGNOSIS — M7752 Other enthesopathy of left foot: Secondary | ICD-10-CM

## 2023-02-23 DIAGNOSIS — Q828 Other specified congenital malformations of skin: Secondary | ICD-10-CM | POA: Diagnosis not present

## 2023-02-23 NOTE — Progress Notes (Signed)
  Subjective:  Patient ID: [name], female    DOB: 03-21-1960,  MRN: [id number]  No chief complaint on file.   62 y.o. female presents with the above complaint.  Patient presents with left fifth metatarsophalangeal joint pain.  Patient states pain for touch is progressive gotten worse worse with ambulation is with pressure patient would like for me to discuss treatment options for her.  She states that she is maybe working on it.  Has been present for quite some time   Review of Systems: Negative except as noted in the HPI. Denies N/V/F/Ch.  Past Medical History:  Diagnosis Date   Adenomatous colon polyp    Allergy    Asthma 1990's   GERD (gastroesophageal reflux disease)    Headache(784.0)    Palpitations    Pneumonia    Tuberculosis 1990's    Current Outpatient Medications:    cholecalciferol (VITAMIN D3) 25 MCG (1000 UNIT) tablet, Take 1,000 Units by mouth daily., Disp: , Rfl:    estradiol (ESTRACE) 0.1 MG/GM vaginal cream, , Disp: , Rfl:    famotidine (PEPCID) 20 MG tablet, Take 1 tablet (20 mg total) by mouth 2 (two) times daily., Disp: 60 tablet, Rfl: 1   ferrous fumarate (FERRETTS) 325 (106 Fe) MG TABS tablet, Take 1 tablet (106 mg of iron total) by mouth 2 (two) times daily., Disp: 180 tablet, Rfl: 1   vitamin B-12 (CYANOCOBALAMIN) 1000 MCG tablet, Take 1,000 mcg by mouth daily., Disp: , Rfl:   Social History   Tobacco Use  Smoking Status Never  Smokeless Tobacco Never    Allergies  Allergen Reactions   Clindamycin Diarrhea   Gabapentin     Caused headaches and dizziness   Objective:  There were no vitals filed for this visit. There is no height or weight on file to calculate BMI. Constitutional Well developed. Well nourished.  Vascular Dorsalis pedis pulses palpable bilaterally. Posterior tibial pulses palpable bilaterally. Capillary refill normal to all digits.  No cyanosis or clubbing noted. Pedal hair growth normal.  Neurologic Normal  speech. Oriented to person, place, and time. Epicritic sensation to light touch grossly present bilaterally.  Dermatologic Nails well groomed and normal in appearance. No open wounds. No skin lesions.  Orthopedic: Left fifth metatarsophalangeal joint pain pain with range of motion of the joint.  Joint noted.  Porokeratotic lesion noted to the lateral aspect of the fifth metatarsophalangeal joint   Radiographs: None Assessment:   1. Capsulitis of metatarsophalangeal (MTP) joint of left foot   2. Porokeratosis    Plan:  Patient was evaluated and treated and all questions answered.  Left fifth MTP capsulitis with underlying porokeratotic lesion -All questions and concerns were discussed with the patient extensive detail given the amount of pain that she is having she would benefit from steroid injection to decrease inflammatory component associate with pain.  Patient agrees with plan we will proceed with steroid injection -A steroid injection was performed at left fifth MTP using 1% plain Lidocaine and 10 mg of Kenalog. This was well tolerated. -Using chisel blade handle the lesion was debride down to healthy dry tissue.  No complication and no pinpoint bleeding noted.  Discussed  No follow-ups on file.

## 2023-03-07 HISTORY — PX: CHOLECYSTECTOMY: SHX55

## 2023-03-22 ENCOUNTER — Ambulatory Visit: Payer: 59 | Admitting: Podiatry

## 2023-03-26 ENCOUNTER — Encounter (HOSPITAL_COMMUNITY): Payer: Self-pay

## 2023-03-26 ENCOUNTER — Ambulatory Visit (HOSPITAL_COMMUNITY)
Admission: EM | Admit: 2023-03-26 | Discharge: 2023-03-26 | Disposition: A | Discharge status: Home / Self Care | Payer: 59 | Attending: Internal Medicine | Admitting: Internal Medicine

## 2023-03-26 DIAGNOSIS — J069 Acute upper respiratory infection, unspecified: Secondary | ICD-10-CM

## 2023-03-26 LAB — POC COVID19/FLU A&B COMBO
Covid Antigen, POC: NEGATIVE
Influenza A Antigen, POC: NEGATIVE
Influenza B Antigen, POC: NEGATIVE

## 2023-03-26 MED ORDER — PROMETHAZINE-DM 6.25-15 MG/5ML PO SYRP: 5.0000 mL | ORAL_SOLUTION | Freq: Three times a day (TID) | ORAL | 0 refills | Status: AC | PRN

## 2023-03-26 NOTE — ED Provider Notes (Signed)
[facility]    CSN: [id number] Arrival date & time: 03/26/23  1805      History   Chief Complaint Chief Complaint  Patient presents with   Sore Throat   Cough    HPI [name] is a 63 y.o. female.   63 year old female who presents to urgent care with complaints of cough, sore throat and bodyaches.  This started 3 days ago.  She was concerned she may have COVID as she has had this several times in the past.  She denies fevers or chills.  She reports the body aches are all over.  She works in [employer] and did not want to risk exposing other people.  She is requesting flu and COVID testing today.   Sore Throat Pertinent negatives include no chest pain, no abdominal pain and no shortness of breath.  Cough Associated symptoms: sore throat   Associated symptoms: no chest pain, no chills, no ear pain, no fever, no rash and no shortness of breath     Past Medical History:  Diagnosis Date   Adenomatous colon polyp    Allergy    Asthma 1990's   GERD (gastroesophageal reflux disease)    Headache(784.0)    Palpitations    Pneumonia    Tuberculosis 1990's    Patient Active Problem List   Diagnosis Date Noted   Vitamin D deficiency 12/02/2021   B12 deficiency 12/02/2021   Atherosclerosis of aorta (HCC) 12/02/2021   Androgenic alopecia 02/15/2021   Eosinophilia 12/19/2019   Osteoporosis 08/10/2018   GERD (gastroesophageal reflux disease) 07/11/2017   Annual physical exam 03/20/2016   PCP NOTES >>>>>>>>>>> 02/20/2016   Anxiety state 02/20/2016   Microscopic hematuria 01/11/2016   Nocturia 01/11/2016   Chest pain 03/03/2014   Palpitations 03/03/2014   Asthma 07/28/2009   Constipation, chronic 07/28/2009    Past Surgical History:  Procedure Laterality Date   APPENDECTOMY     COLONOSCOPY  08/31/2009   COLONOSCOPY  12/12/2019   COLPOSCOPY  04/28/2016   LAPAROSCOPIC APPENDECTOMY N/A 06/18/2020   Procedure: APPENDECTOMY LAPAROSCOPIC;  Surgeon:  Diamantina Monks, MD;  Location: [facility];  Service: General;  Laterality: N/A;   OVARIAN CYST REMOVAL      OB History   No obstetric history on file.      Home Medications    Prior to Admission medications   Medication Sig Start Date End Date Taking? Authorizing Provider  cholecalciferol (VITAMIN D3) 25 MCG (1000 UNIT) tablet Take 1,000 Units by mouth daily.   Yes [provider]  estradiol (ESTRACE) 0.1 MG/GM vaginal cream    Yes [provider]  famotidine (PEPCID) 20 MG tablet Take 1 tablet (20 mg total) by mouth 2 (two) times daily. 05/05/22  Yes Tressia Danas, MD  ferrous fumarate (FERRETTS) 325 (106 Fe) MG TABS tablet Take 1 tablet (106 mg of iron total) by mouth 2 (two) times daily. 11/28/21  Yes Paz, Nolon Rod, MD  vitamin B-12 (CYANOCOBALAMIN) 1000 MCG tablet Take 1,000 mcg by mouth daily.   Yes [provider]    Family History Family History  Problem Relation Age of Onset   Hypertension Mother    High Cholesterol Mother    Atrial fibrillation Father        pacemaker   Other Father        blood clotting disorder   Hypertension Brother    Hyperlipidemia Brother    Diabetes Paternal Grandfather    Heart disease Other  Hypertension Other    Hyperlipidemia Other    Hypertension Other    Colon cancer Neg Hx    Breast cancer Neg Hx    Colon polyps Neg Hx     Social History Social History   Tobacco Use   Smoking status: Never   Smokeless tobacco: Never  Vaping Use   Vaping status: Never Used  Substance Use Topics   Alcohol use: Yes    Comment: occasional wine   Drug use: No     Allergies   Clindamycin and Gabapentin   Review of Systems Review of Systems  Constitutional:  Negative for chills and fever.  HENT:  Positive for sore throat. Negative for ear pain.   Eyes:  Negative for pain and visual disturbance.  Respiratory:  Positive for cough. Negative for shortness of breath.   Cardiovascular:  Negative for chest pain and  palpitations.  Gastrointestinal:  Negative for abdominal pain and vomiting.  Genitourinary:  Negative for dysuria and hematuria.  Musculoskeletal:  Negative for arthralgias and back pain.       Generalized body aches  Skin:  Negative for color change and rash.  Neurological:  Negative for seizures and syncope.  All other systems reviewed and are negative.    Physical Exam Triage Vital Signs ED Triage Vitals  Encounter Vitals Group     BP 03/26/23 1919 116/74     Systolic BP Percentile --      Diastolic BP Percentile --      Pulse Rate 03/26/23 1919 96     Resp 03/26/23 1919 16     Temp 03/26/23 1919 98.8 F (37.1 C)     Temp Source 03/26/23 1919 Oral     SpO2 03/26/23 1919 98 %     Weight --      Height --      Head Circumference --      Peak Flow --      Pain Score 03/26/23 1921 5     Pain Loc --      Pain Education --      Exclude from Growth Chart --    No data found.  Updated Vital Signs BP 116/74 (BP Location: Left Arm)   Pulse 96   Temp 98.8 F (37.1 C) (Oral)   Resp 16   SpO2 98%   Visual Acuity Right Eye Distance:   Left Eye Distance:   Bilateral Distance:    Right Eye Near:   Left Eye Near:    Bilateral Near:     Physical Exam Vitals and nursing note reviewed.  Constitutional:      General: She is not in acute distress.    Appearance: She is well-developed.  HENT:     Head: Normocephalic and atraumatic.     Right Ear: Tympanic membrane normal.     Left Ear: Tympanic membrane normal.     Mouth/Throat:     Mouth: Mucous membranes are moist.     Pharynx: Posterior oropharyngeal erythema (mild) present.  Eyes:     Conjunctiva/sclera: Conjunctivae normal.  Cardiovascular:     Rate and Rhythm: Normal rate and regular rhythm.     Heart sounds: No murmur heard. Pulmonary:     Effort: Pulmonary effort is normal. No respiratory distress.     Breath sounds: Normal breath sounds.  Abdominal:     Palpations: Abdomen is soft.     Tenderness: There  is no abdominal tenderness.  Musculoskeletal:  General: No swelling.     Cervical back: Neck supple.  Skin:    General: Skin is warm and dry.     Capillary Refill: Capillary refill takes less than 2 seconds.  Neurological:     Mental Status: She is alert.  Psychiatric:        Mood and Affect: Mood normal.      UC Treatments / Results  Labs (all labs ordered are listed, but only abnormal results are displayed) Labs Reviewed - No data to display  EKG   Radiology No results found.  Procedures Procedures (including critical care time)  Medications Ordered in UC Medications - No data to display  Initial Impression / Assessment and Plan / UC Course  I have reviewed the triage vital signs and the nursing notes.  Pertinent labs & imaging results that were available during my care of the patient were reviewed by me and considered in my medical decision making (see chart for details).     Viral URI with cough   Flu A, flu B and COVID are negative.  Symptoms most consistent with a viral upper respiratory illness with cough.  This does not require antibiotic treatment.  We can treat with the following: Promethazine DM 5 mL every 8 hours as needed for cough.  Use caution as this medication can cause drowsiness. Tylenol or ibuprofen for body aches and fevers Rest and stay hydrated Return to urgent care or PCP if symptoms worsen or fail to resolve.    Final Clinical Impressions(s) / UC Diagnoses   Final diagnoses:  None   Discharge Instructions   None    ED Prescriptions   None    PDMP not reviewed this encounter.   Landis Martins, [place] 03/26/23 2100

## 2023-03-26 NOTE — ED Triage Notes (Signed)
Patient presents with sore throat,cough,body aches x 3 days.

## 2023-03-26 NOTE — Discharge Instructions (Addendum)
Flu A, flu B and COVID are negative.  Symptoms most consistent with a viral upper respiratory illness with cough.  This does not require antibiotic treatment.  We can treat with the following: Promethazine DM 5 mL every 8 hours as needed for cough.  Use caution as this medication can cause drowsiness. Tylenol or ibuprofen for body aches and fevers Rest and stay hydrated Return to urgent care or PCP if symptoms worsen or fail to resolve.

## 2023-06-11 LAB — HM MAMMOGRAPHY

## 2023-06-14 ENCOUNTER — Telehealth: Payer: Self-pay | Admitting: Internal Medicine

## 2023-06-14 NOTE — Telephone Encounter (Signed)
 Pt would like to switch from Dr Lynnette Caffey to Dr Jacques Navy. Please advise

## 2023-06-18 ENCOUNTER — Encounter: Payer: Self-pay | Admitting: Internal Medicine

## 2023-07-26 ENCOUNTER — Ambulatory Visit: Admitting: Physician Assistant

## 2023-09-03 ENCOUNTER — Ambulatory Visit: Admitting: Pediatrics

## 2023-10-09 ENCOUNTER — Ambulatory Visit: Admitting: Internal Medicine

## 2023-10-10 ENCOUNTER — Telehealth: Payer: Self-pay | Admitting: Gastroenterology

## 2023-10-10 NOTE — Telephone Encounter (Signed)
 Good Morning Dr. Charlanne, I received a call from this patient husband requesting that we scheduled an appointment with you for his wife. Patient husband stated that she was seen with [facility] in [place]. Patient husband stated that due to us  not having sooner availability back then that's the reason she was seen out out of the practice. Patient and husband would like to continue there care with us . Would you please advise on scheduling.   Thank you

## 2023-10-15 ENCOUNTER — Other Ambulatory Visit: Payer: Self-pay

## 2023-10-15 ENCOUNTER — Encounter (HOSPITAL_COMMUNITY): Payer: Self-pay

## 2023-10-15 ENCOUNTER — Observation Stay (HOSPITAL_COMMUNITY)
Admission: EM | Admit: 2023-10-15 | Discharge: 2023-10-17 | Disposition: A | Discharge status: Home / Self Care | Attending: Surgery | Admitting: Surgery

## 2023-10-15 ENCOUNTER — Emergency Department (HOSPITAL_COMMUNITY)

## 2023-10-15 DIAGNOSIS — K8010 Calculus of gallbladder with chronic cholecystitis without obstruction: Principal | ICD-10-CM | POA: Insufficient documentation

## 2023-10-15 DIAGNOSIS — J45909 Unspecified asthma, uncomplicated: Secondary | ICD-10-CM | POA: Diagnosis not present

## 2023-10-15 DIAGNOSIS — R319 Hematuria, unspecified: Secondary | ICD-10-CM | POA: Insufficient documentation

## 2023-10-15 DIAGNOSIS — K8050 Calculus of bile duct without cholangitis or cholecystitis without obstruction: Secondary | ICD-10-CM

## 2023-10-15 DIAGNOSIS — R1011 Right upper quadrant pain: Secondary | ICD-10-CM

## 2023-10-15 DIAGNOSIS — K819 Cholecystitis, unspecified: Secondary | ICD-10-CM | POA: Diagnosis present

## 2023-10-15 DIAGNOSIS — Z9049 Acquired absence of other specified parts of digestive tract: Secondary | ICD-10-CM | POA: Insufficient documentation

## 2023-10-15 DIAGNOSIS — R7989 Other specified abnormal findings of blood chemistry: Secondary | ICD-10-CM | POA: Diagnosis not present

## 2023-10-15 DIAGNOSIS — K8020 Calculus of gallbladder without cholecystitis without obstruction: Secondary | ICD-10-CM

## 2023-10-15 DIAGNOSIS — F1090 Alcohol use, unspecified, uncomplicated: Secondary | ICD-10-CM | POA: Diagnosis not present

## 2023-10-15 DIAGNOSIS — K810 Acute cholecystitis: Secondary | ICD-10-CM | POA: Diagnosis present

## 2023-10-15 DIAGNOSIS — R1013 Epigastric pain: Principal | ICD-10-CM

## 2023-10-15 LAB — URINALYSIS, ROUTINE W REFLEX MICROSCOPIC
Bacteria, UA: NONE SEEN
Bilirubin Urine: NEGATIVE
Glucose, UA: NEGATIVE mg/dL
Ketones, ur: NEGATIVE mg/dL
Leukocytes,Ua: NEGATIVE
Nitrite: NEGATIVE
Protein, ur: NEGATIVE mg/dL
Specific Gravity, Urine: 1.01 (ref 1.005–1.030)
pH: 6 (ref 5.0–8.0)

## 2023-10-15 LAB — CBC
HCT: 38.7 % (ref 36.0–46.0)
HCT: 36.1 % (ref 36.0–46.0)
Hemoglobin: 12.7 g/dL (ref 12.0–15.0)
Hemoglobin: 11.8 g/dL — ABNORMAL LOW (ref 12.0–15.0)
MCH: 30.8 pg (ref 26.0–34.0)
MCH: 30.8 pg (ref 26.0–34.0)
MCHC: 32.8 g/dL (ref 30.0–36.0)
MCHC: 32.7 g/dL (ref 30.0–36.0)
MCV: 93.7 fL (ref 80.0–100.0)
MCV: 94.3 fL (ref 80.0–100.0)
Platelets: 229 K/uL (ref 150–400)
Platelets: 193 K/uL (ref 150–400)
RBC: 4.13 MIL/uL (ref 3.87–5.11)
RBC: 3.83 MIL/uL — ABNORMAL LOW (ref 3.87–5.11)
RDW: 13.3 % (ref 11.5–15.5)
RDW: 13.3 % (ref 11.5–15.5)
WBC: 7.7 K/uL (ref 4.0–10.5)
WBC: 7.6 K/uL (ref 4.0–10.5)
nRBC: 0 % (ref 0.0–0.2)
nRBC: 0 % (ref 0.0–0.2)

## 2023-10-15 LAB — COMPREHENSIVE METABOLIC PANEL WITH GFR
ALT: 246 U/L — ABNORMAL HIGH (ref 0–44)
AST: 46 U/L — ABNORMAL HIGH (ref 15–41)
Albumin: 3.7 g/dL (ref 3.5–5.0)
Alkaline Phosphatase: 147 U/L — ABNORMAL HIGH (ref 38–126)
Anion gap: 13 (ref 5–15)
BUN: 11 mg/dL (ref 8–23)
CO2: 22 mmol/L (ref 22–32)
Calcium: 8.9 mg/dL (ref 8.9–10.3)
Chloride: 106 mmol/L (ref 98–111)
Creatinine, Ser: 0.47 mg/dL (ref 0.44–1.00)
GFR, Estimated: >60 mL/min (ref >60)
Glucose, Bld: 102 mg/dL — ABNORMAL HIGH (ref 70–99)
Potassium: 3.7 mmol/L (ref 3.5–5.1)
Sodium: 141 mmol/L (ref 135–145)
Total Bilirubin: 0.9 mg/dL (ref 0.0–1.2)
Total Protein: 6.8 g/dL (ref 6.5–8.1)

## 2023-10-15 LAB — LIPASE, BLOOD: Lipase: 30 U/L (ref 11–51)

## 2023-10-15 LAB — CREATININE, SERUM
Creatinine, Ser: 0.5 mg/dL (ref 0.44–1.00)
GFR, Estimated: >60 mL/min (ref >60)

## 2023-10-15 LAB — HIV ANTIBODY (ROUTINE TESTING W REFLEX): HIV Screen 4th Generation wRfx: NONREACTIVE

## 2023-10-15 MED ORDER — SIMETHICONE 80 MG PO CHEW
80.0000 mg | CHEWABLE_TABLET | Freq: Four times a day (QID) | ORAL | Status: DC | PRN
  Administered 2023-10-16 – 2023-10-17 (×4): 80 mg via ORAL
  Filled 2023-10-15 (×2): qty 1

## 2023-10-15 MED ORDER — ENOXAPARIN SODIUM 40 MG/0.4ML IJ SOSY
40.0000 mg | PREFILLED_SYRINGE | INTRAMUSCULAR | Status: DC
  Administered 2023-10-15 – 2023-10-16 (×4): 40 mg via SUBCUTANEOUS
  Filled 2023-10-15 (×2): qty 0.4

## 2023-10-15 MED ORDER — METHOCARBAMOL 1000 MG/10ML IJ SOLN: 500.0000 mg | Freq: Four times a day (QID) | INTRAMUSCULAR | Status: DC | PRN

## 2023-10-15 MED ORDER — MORPHINE SULFATE (PF) 4 MG/ML IV SOLN
4.0000 mg | Freq: Once | INTRAVENOUS | Status: AC
  Administered 2023-10-15 (×2): 4 mg via INTRAVENOUS
  Filled 2023-10-15: qty 1

## 2023-10-15 MED ORDER — LACTATED RINGERS IV BOLUS
1000.0000 mL | Freq: Once | INTRAVENOUS | Status: AC
  Administered 2023-10-15 (×2): 1000 mL via INTRAVENOUS

## 2023-10-15 MED ORDER — DOCUSATE SODIUM 100 MG PO CAPS
100.0000 mg | ORAL_CAPSULE | Freq: Two times a day (BID) | ORAL | Status: DC
  Administered 2023-10-15 – 2023-10-17 (×8): 100 mg via ORAL
  Filled 2023-10-15 (×4): qty 1

## 2023-10-15 MED ORDER — ONDANSETRON HCL 4 MG/2ML IJ SOLN
4.0000 mg | Freq: Four times a day (QID) | INTRAMUSCULAR | Status: DC | PRN
  Administered 2023-10-16 (×2): 4 mg via INTRAVENOUS
  Filled 2023-10-15: qty 2

## 2023-10-15 MED ORDER — OXYCODONE HCL 5 MG PO TABS: 5.0000 mg | ORAL_TABLET | ORAL | Status: DC | PRN

## 2023-10-15 MED ORDER — PROCHLORPERAZINE EDISYLATE 10 MG/2ML IJ SOLN
10.0000 mg | INTRAMUSCULAR | Status: DC | PRN
  Administered 2023-10-16 (×2): 10 mg via INTRAVENOUS
  Filled 2023-10-15: qty 2

## 2023-10-15 MED ORDER — HYDROMORPHONE HCL 1 MG/ML IJ SOLN: 0.5000 mg | INTRAMUSCULAR | Status: DC | PRN

## 2023-10-15 MED ORDER — ONDANSETRON HCL 4 MG/2ML IJ SOLN
4.0000 mg | Freq: Once | INTRAMUSCULAR | Status: AC
  Administered 2023-10-15 (×2): 4 mg via INTRAVENOUS
  Filled 2023-10-15: qty 2

## 2023-10-15 MED ORDER — LACTATED RINGERS IV SOLN: INTRAVENOUS | Status: AC

## 2023-10-15 MED ORDER — OXYCODONE HCL 5 MG PO TABS: 10.0000 mg | ORAL_TABLET | ORAL | Status: DC | PRN

## 2023-10-15 MED ORDER — ACETAMINOPHEN 325 MG PO TABS
650.0000 mg | ORAL_TABLET | Freq: Four times a day (QID) | ORAL | Status: DC
  Administered 2023-10-15 – 2023-10-16 (×4): 650 mg via ORAL
  Filled 2023-10-15 (×2): qty 2

## 2023-10-15 MED ORDER — KETOROLAC TROMETHAMINE 15 MG/ML IJ SOLN
15.0000 mg | Freq: Three times a day (TID) | INTRAMUSCULAR | Status: DC
  Administered 2023-10-15 – 2023-10-17 (×10): 15 mg via INTRAVENOUS
  Filled 2023-10-15 (×5): qty 1

## 2023-10-15 MED ORDER — SODIUM CHLORIDE 0.9 % IV SOLN
2.0000 g | INTRAVENOUS | Status: DC
  Administered 2023-10-15 (×2): 2 g via INTRAVENOUS
  Filled 2023-10-15: qty 20

## 2023-10-15 NOTE — ED Notes (Signed)
 [facility]N notified patient being transported to unit.   Transport team on the way to take patient to [facility].

## 2023-10-15 NOTE — ED Provider Notes (Signed)
 [facility] Provider Note   CSN: [id number] Arrival date & time: 10/15/23  1102     Patient presents with: Abdominal Pain   [name] is a 63 y.o. female.   Pt with c/o epigastric and ruq pain in past day. Constant, dull, waxing/waning in intensity, non radiating. Hx similar episodes of pain in past,  unsure of cause (gallstones have been noted on prior imaging). No fever or chills. Nausea. No vomiting. No dysuria or gu c/o. No back/flank pain.   The history is provided by the patient, medical records and a significant other.  Abdominal Pain Associated symptoms: nausea   Associated symptoms: no chest pain, no chills, no constipation, no cough, no diarrhea, no dysuria, no fever, no shortness of breath and no vomiting        Prior to Admission medications   Medication Sig Start Date End Date Taking? Authorizing Provider  cholecalciferol (VITAMIN D3) 25 MCG (1000 UNIT) tablet Take 1,000 Units by mouth daily.    [provider]  estradiol (ESTRACE) 0.1 MG/GM vaginal cream     [provider]  famotidine  (PEPCID ) 20 MG tablet Take 1 tablet (20 mg total) by mouth 2 (two) times daily. 05/05/22   Eda Iha, MD  ferrous fumarate  (FERRETTS) 325 (106 Fe) MG TABS tablet Take 1 tablet (106 mg of iron total) by mouth 2 (two) times daily. 11/28/21   Paz, Jose E, MD  promethazine -dextromethorphan (PROMETHAZINE -DM) 6.25-15 MG/5ML syrup Take 5 mLs by mouth every 8 (eight) hours as needed for cough. 03/26/23   White, Elizabeth A, PA-C  vitamin B-12 (CYANOCOBALAMIN) 1000 MCG tablet Take 1,000 mcg by mouth daily.    [provider]    Allergies: Clindamycin and Gabapentin    Review of Systems  Constitutional:  Negative for chills and fever.  Respiratory:  Negative for cough and shortness of breath.   Cardiovascular:  Negative for chest pain.  Gastrointestinal:  Positive for abdominal pain and nausea. Negative for  constipation, diarrhea and vomiting.  Genitourinary:  Negative for dysuria and flank pain.  Musculoskeletal:  Negative for back pain.  Neurological:  Negative for headaches.    Updated Vital Signs BP 113/63   Pulse 64   Temp 98.5 F (36.9 C)   Resp 16   Ht 1.524 m (5')   Wt 52.6 kg   SpO2 100%   BMI 22.65 kg/m   Physical Exam Vitals and nursing note reviewed.  Constitutional:      Appearance: Normal appearance. She is well-developed.  HENT:     Head: Atraumatic.     Nose: Nose normal.     Mouth/Throat:     Mouth: Mucous membranes are moist.  Eyes:     General: No scleral icterus.    Conjunctiva/sclera: Conjunctivae normal.  Neck:     Trachea: No tracheal deviation.  Cardiovascular:     Rate and Rhythm: Normal rate and regular rhythm.     Pulses: Normal pulses.     Heart sounds: Normal heart sounds. No murmur heard.    No friction rub. No gallop.  Pulmonary:     Effort: Pulmonary effort is normal. No respiratory distress.     Breath sounds: Normal breath sounds.  Abdominal:     General: Bowel sounds are normal. There is no distension.     Palpations: Abdomen is soft. There is no mass.     Tenderness: There is abdominal tenderness. There is no guarding or rebound.  Hernia: No hernia is present.     Comments: Mild epigastric and ruq tenderness.   Genitourinary:    Comments: No cva tenderness.  Musculoskeletal:        General: No swelling or tenderness.     Cervical back: Normal range of motion and neck supple. No rigidity. No muscular tenderness.     Right lower leg: No edema.     Left lower leg: No edema.  Skin:    General: Skin is warm and dry.     Findings: No rash.  Neurological:     Mental Status: She is alert.     Comments: Alert, speech normal.   Psychiatric:        Mood and Affect: Mood normal.     (all labs ordered are listed, but only abnormal results are displayed) Results for orders placed or performed during the hospital encounter of  10/15/23  Urinalysis, Routine w reflex microscopic -Urine, Clean Catch   Collection Time: 10/15/23 11:30 AM  Result Value Ref Range   Color, Urine YELLOW YELLOW   APPearance CLEAR CLEAR   Specific Gravity, Urine 1.010 1.005 - 1.030   pH 6.0 5.0 - 8.0   Glucose, UA NEGATIVE NEGATIVE mg/dL   Hgb urine dipstick LARGE (A) NEGATIVE   Bilirubin Urine NEGATIVE NEGATIVE   Ketones, ur NEGATIVE NEGATIVE mg/dL   Protein, ur NEGATIVE NEGATIVE mg/dL   Nitrite NEGATIVE NEGATIVE   Leukocytes,Ua NEGATIVE NEGATIVE   RBC / HPF 21-50 0 - 5 RBC/hpf   WBC, UA 0-5 0 - 5 WBC/hpf   Bacteria, UA NONE SEEN NONE SEEN   Squamous Epithelial / HPF 0-5 0 - 5 /HPF   Mucus PRESENT   Lipase, blood   Collection Time: 10/15/23 11:45 AM  Result Value Ref Range   Lipase 30 11 - 51 U/L  Comprehensive metabolic panel   Collection Time: 10/15/23 11:45 AM  Result Value Ref Range   Sodium 141 135 - 145 mmol/L   Potassium 3.7 3.5 - 5.1 mmol/L   Chloride 106 98 - 111 mmol/L   CO2 22 22 - 32 mmol/L   Glucose, Bld 102 (H) 70 - 99 mg/dL   BUN 11 8 - 23 mg/dL   Creatinine, Ser 9.52 0.44 - 1.00 mg/dL   Calcium  8.9 8.9 - 10.3 mg/dL   Total Protein 6.8 6.5 - 8.1 g/dL   Albumin 3.7 3.5 - 5.0 g/dL   AST 46 (H) 15 - 41 U/L   ALT 246 (H) 0 - 44 U/L   Alkaline Phosphatase 147 (H) 38 - 126 U/L   Total Bilirubin 0.9 0.0 - 1.2 mg/dL   GFR, Estimated >39 >39 mL/min   Anion gap 13 5 - 15  CBC   Collection Time: 10/15/23 11:45 AM  Result Value Ref Range   WBC 7.7 4.0 - 10.5 K/uL   RBC 4.13 3.87 - 5.11 MIL/uL   Hemoglobin 12.7 12.0 - 15.0 g/dL   HCT 61.2 63.9 - 53.9 %   MCV 93.7 80.0 - 100.0 fL   MCH 30.8 26.0 - 34.0 pg   MCHC 32.8 30.0 - 36.0 g/dL   RDW 86.6 88.4 - 84.4 %   Platelets 229 150 - 400 K/uL   nRBC 0.0 0.0 - 0.2 %      EKG: EKG Interpretation Date/Time:  Monday October 15 2023 11:11:15 EDT Ventricular Rate:  74 PR Interval:  130 QRS Duration:  74 QT Interval:  388 QTC Calculation: 430 R  Axis:   -  54  Text Interpretation: Normal sinus rhythm Left anterior fascicular block Confirmed by Bernard Drivers ([id number]) on 10/15/2023 1:20:25 PM  Radiology: US  Abdomen Limited RUQ (LIVER/GB) Result Date: 10/15/2023 CLINICAL DATA:  Abdominal pain EXAM: ULTRASOUND ABDOMEN LIMITED RIGHT UPPER QUADRANT COMPARISON:  None Available. FINDINGS: Gallbladder: Large shadowing gallstone within lumen the gallbladder measures 2.3 cm. There is mild gallbladder wall thickening. No pericholecystic fluid. Patient on pain medication. Difficult to assess sonographic Murphy's sign. Common bile duct: Diameter: Upper limits normal 6 mm. Liver: No focal lesion identified. Within normal limits in parenchymal echogenicity. Portal vein is patent on color Doppler imaging with normal direction of blood flow towards the liver. Other: None. IMPRESSION: 1. Large gallstone.  Mild gallbladder wall thickening. 2. No biliary duct dilatation. Electronically Signed   By: Jackquline Boxer M.D.   On: 10/15/2023 15:48     Procedures   Medications Ordered in the ED  lactated ringers  bolus 1,000 mL (0 mLs Intravenous Stopped 10/15/23 1621)  morphine  (PF) 4 MG/ML injection 4 mg (4 mg Intravenous Given 10/15/23 1357)  ondansetron  (ZOFRAN ) injection 4 mg (4 mg Intravenous Given 10/15/23 1356)                                    Medical Decision Making Problems Addressed: Biliary colic: acute illness or injury with systemic symptoms Elevated liver function tests: acute illness or injury Epigastric pain: acute illness or injury with systemic symptoms Gallstones:    Details: Acute and chronic Hematuria, unspecified type: acute illness or injury Right upper quadrant pain: acute illness or injury  Amount and/or Complexity of Data Reviewed Independent Historian:     Details: Family/friend, hx External Data Reviewed: radiology and notes. Labs: ordered. Decision-making details documented in ED Course. Radiology: ordered and independent  interpretation performed. Decision-making details documented in ED Course. Discussion of management or test interpretation with external provider(s): Gen surgery  Risk Prescription drug management. Parenteral controlled substances. Decision regarding hospitalization.   Iv ns. Continuous pulse ox and cardiac monitoring. Labs ordered/sent. Imaging ordered.   Differential diagnosis includes gastritis, pud, gallstones, etc. Dispo decision including potential need for admission considered - will get labs and imaging and reassess.   Reviewed nursing notes and prior charts for additional history. External reports reviewed. Additional history from: family/friend.   LR bolus. Morphine  iv. Zofran  iv.   Cardiac monitor: sinus rhythm, rate 75  Labs reviewed/interpreted by me - wbc and hgb normal. Lfts, ast/alt, alk phos, elevated. Ua w rbcs.   US  reviewed/interpreted by me - large gallstone, mild gb thickening, no pericholecystic fluid.   General surgery consulted for possible admission.  Pt notes pain improved. Abd soft nt.   1645, gen surgery call back pending - signed out to Dr Avonne to f/u with gen surgery call back, probable admission, repeat lfts in AM, possible cholecystectomy.         Final diagnoses:  Epigastric pain  Right upper quadrant pain  Biliary colic  Gallstones  Elevated liver function tests  Hematuria, unspecified type    ED Discharge Orders     None          Bernard Drivers, MD 10/15/23 1651

## 2023-10-15 NOTE — ED Triage Notes (Signed)
 Patient c/o epigastric pain, worse when laying down, causing cough x 1 week that is episodic in nature, hx of GERD, patient reports that she takes famotidine  as needed but it didn't help and can't take prilosec d/t osteopenia. Recently took abx for H pylori.

## 2023-10-15 NOTE — H&P (Signed)
 Admitting Physician: Deward PARAS Ashaki Frosch  Service: General Surgery   CC: Abdominal Pain  Subjective   HPI: [name] is an 63 y.o. female who is here for abdominal pain.  She has had multiple attacks of abdominal pain, two over the last week.  It happens at night.  It is located in the epigastric area but has also radiated into her back and into her right shoulder.  She has recently been treated for H. Pylori but has not had an upper endoscopy.  She does not eat a lot of fatty foods. She deals with heartburn.  Past Medical History:  Diagnosis Date   Adenomatous colon polyp    Allergy    Asthma 1990's   GERD (gastroesophageal reflux disease)    Headache(784.0)    Palpitations    Pneumonia    Tuberculosis 1990's    Past Surgical History:  Procedure Laterality Date   APPENDECTOMY     COLONOSCOPY  08/31/2009   COLONOSCOPY  12/12/2019   COLPOSCOPY  04/28/2016   LAPAROSCOPIC APPENDECTOMY N/A 06/18/2020   Procedure: APPENDECTOMY LAPAROSCOPIC;  Surgeon: Paola Dreama SAILOR, MD;  Location: [facility] OR;  Service: General;  Laterality: N/A;   OVARIAN CYST REMOVAL      Family History  Problem Relation Age of Onset   Hypertension Mother    High Cholesterol Mother    Atrial fibrillation Father        pacemaker   Other Father        blood clotting disorder   Hypertension Brother    Hyperlipidemia Brother    Diabetes Paternal Grandfather    Heart disease Other    Hypertension Other    Hyperlipidemia Other    Hypertension Other    Colon cancer Neg Hx    Breast cancer Neg Hx    Colon polyps Neg Hx     Social:  reports that she has never smoked. She has never used smokeless tobacco. She reports current alcohol use. She reports that she does not use drugs.  Allergies:  Allergies  Allergen Reactions   Clindamycin Diarrhea   Gabapentin     Caused headaches and dizziness    Medications: Current Outpatient Medications  Medication Instructions   acetaminophen  (TYLENOL ) 500  mg, Every 6 hours PRN   celecoxib (CELEBREX) 200 mg, Oral, Daily PRN   cholecalciferol (VITAMIN D3) 1,000 Units, Daily   cyanocobalamin (VITAMIN B12) 1,000 mcg, Daily   estradiol (ESTRACE) 0.1 MG/GM vaginal cream    famotidine  (PEPCID ) 20 mg, Oral, 2 times daily   ibuprofen  (ADVIL ) 200 mg, Every 6 hours PRN   promethazine -dextromethorphan (PROMETHAZINE -DM) 6.25-15 MG/5ML syrup 5 mLs, Oral, Every 8 hours PRN    ROS - all of the below systems have been reviewed with the patient and positives are indicated with bold text General: chills, fever or night sweats Eyes: blurry vision or double vision ENT: epistaxis or sore throat Allergy/Immunology: itchy/watery eyes or nasal congestion Hematologic/Lymphatic: bleeding problems, blood clots or swollen lymph nodes Endocrine: temperature intolerance or unexpected weight changes Breast: new or changing breast lumps or nipple discharge Resp: cough, shortness of breath, or wheezing CV: chest pain or dyspnea on exertion GI: as per HPI GU: dysuria, trouble voiding, or hematuria MSK: joint pain or joint stiffness Neuro: TIA or stroke symptoms Derm: pruritus and skin lesion changes Psych: anxiety and depression  Objective   PE Blood pressure 111/63, pulse 64, temperature 97.6 F (36.4 C), temperature source Oral, resp. rate 16, height 5' (1.524 m),  weight 52.6 kg, SpO2 100%. Constitutional: NAD; conversant; no deformities Eyes: Moist conjunctiva; no lid lag; anicteric; PERRL Neck: Trachea midline; no thyromegaly Lungs: Normal respiratory effort; no tactile fremitus CV: RRR; no palpable thrills; no pitting edema GI: Abd Soft, tender in epigastric area; no palpable hepatosplenomegaly MSK: Normal range of motion of extremities; no clubbing/cyanosis Psychiatric: Appropriate affect; alert and oriented x3 Lymphatic: No palpable cervical or axillary lymphadenopathy  Results for orders placed or performed during the hospital encounter of 10/15/23  (from the past 24 hours)  Urinalysis, Routine w reflex microscopic -Urine, Clean Catch     Status: Abnormal   Collection Time: 10/15/23 11:30 AM  Result Value Ref Range   Color, Urine YELLOW YELLOW   APPearance CLEAR CLEAR   Specific Gravity, Urine 1.010 1.005 - 1.030   pH 6.0 5.0 - 8.0   Glucose, UA NEGATIVE NEGATIVE mg/dL   Hgb urine dipstick LARGE (A) NEGATIVE   Bilirubin Urine NEGATIVE NEGATIVE   Ketones, ur NEGATIVE NEGATIVE mg/dL   Protein, ur NEGATIVE NEGATIVE mg/dL   Nitrite NEGATIVE NEGATIVE   Leukocytes,Ua NEGATIVE NEGATIVE   RBC / HPF 21-50 0 - 5 RBC/hpf   WBC, UA 0-5 0 - 5 WBC/hpf   Bacteria, UA NONE SEEN NONE SEEN   Squamous Epithelial / HPF 0-5 0 - 5 /HPF   Mucus PRESENT   Lipase, blood     Status: None   Collection Time: 10/15/23 11:45 AM  Result Value Ref Range   Lipase 30 11 - 51 U/L  Comprehensive metabolic panel     Status: Abnormal   Collection Time: 10/15/23 11:45 AM  Result Value Ref Range   Sodium 141 135 - 145 mmol/L   Potassium 3.7 3.5 - 5.1 mmol/L   Chloride 106 98 - 111 mmol/L   CO2 22 22 - 32 mmol/L   Glucose, Bld 102 (H) 70 - 99 mg/dL   BUN 11 8 - 23 mg/dL   Creatinine, Ser 9.52 0.44 - 1.00 mg/dL   Calcium  8.9 8.9 - 10.3 mg/dL   Total Protein 6.8 6.5 - 8.1 g/dL   Albumin 3.7 3.5 - 5.0 g/dL   AST 46 (H) 15 - 41 U/L   ALT 246 (H) 0 - 44 U/L   Alkaline Phosphatase 147 (H) 38 - 126 U/L   Total Bilirubin 0.9 0.0 - 1.2 mg/dL   GFR, Estimated >39 >39 mL/min   Anion gap 13 5 - 15  CBC     Status: None   Collection Time: 10/15/23 11:45 AM  Result Value Ref Range   WBC 7.7 4.0 - 10.5 K/uL   RBC 4.13 3.87 - 5.11 MIL/uL   Hemoglobin 12.7 12.0 - 15.0 g/dL   HCT 61.2 63.9 - 53.9 %   MCV 93.7 80.0 - 100.0 fL   MCH 30.8 26.0 - 34.0 pg   MCHC 32.8 30.0 - 36.0 g/dL   RDW 86.6 88.4 - 84.4 %   Platelets 229 150 - 400 K/uL   nRBC 0.0 0.0 - 0.2 %     Imaging Orders         US  Abdomen Limited RUQ (LIVER/GB)     1. Large gallstone.  Mild gallbladder  wall thickening. 2. No biliary duct dilatation.  Assessment and Plan   [name] is an 63 y.o. female with abdominal pain, found to have signs of cholecystitis on RUQ US .  I recommended laparoscopic cholecystectomy with intraoperative cholangiogram.  We discussed the procedure, its risks, benefits and alternatives  and the patient granted consent to proceed. We will admit the patient to the hospital and plan for surgery in the AM.    ICD-10-CM   1. Epigastric pain  R10.13     2. Right upper quadrant pain  R10.11     3. Biliary colic  K80.50     4. Gallstones  K80.20     5. Elevated liver function tests  R79.89     6. Hematuria, unspecified type  R31.9        Deward JINNY Foy, MD  [facility] Use [email or web address] to contact on call provider  New Patient Billing: [phone] - High MDM

## 2023-10-15 NOTE — ED Notes (Signed)
 Transport arrived to transfer patient to [facility]

## 2023-10-15 NOTE — ED Provider Notes (Signed)
 Patient evaluated by surgery to be admitted to their service for likely cholecystectomy in the a.m./further pain control.  Stable throughout my care.   Ruthe Cornet, DO 10/15/23 [phone]

## 2023-10-15 NOTE — Discharge Instructions (Addendum)

## 2023-10-16 ENCOUNTER — Encounter (HOSPITAL_COMMUNITY): Payer: Self-pay | Admitting: Surgery

## 2023-10-16 ENCOUNTER — Other Ambulatory Visit: Payer: Self-pay

## 2023-10-16 ENCOUNTER — Inpatient Hospital Stay (HOSPITAL_COMMUNITY)

## 2023-10-16 ENCOUNTER — Encounter (HOSPITAL_COMMUNITY)
Admission: EM | Disposition: A | Discharge status: Home / Self Care | Payer: Self-pay | Source: Home / Self Care | Attending: Emergency Medicine

## 2023-10-16 ENCOUNTER — Inpatient Hospital Stay (HOSPITAL_COMMUNITY): Admitting: Anesthesiology

## 2023-10-16 ENCOUNTER — Inpatient Hospital Stay (HOSPITAL_BASED_OUTPATIENT_CLINIC_OR_DEPARTMENT_OTHER): Admitting: Anesthesiology

## 2023-10-16 DIAGNOSIS — K828 Other specified diseases of gallbladder: Secondary | ICD-10-CM | POA: Diagnosis not present

## 2023-10-16 HISTORY — PX: CHOLECYSTECTOMY: SHX55

## 2023-10-16 LAB — CBC
HCT: 35.9 % — ABNORMAL LOW (ref 36.0–46.0)
Hemoglobin: 11.7 g/dL — ABNORMAL LOW (ref 12.0–15.0)
MCH: 31 pg (ref 26.0–34.0)
MCHC: 32.6 g/dL (ref 30.0–36.0)
MCV: 95.2 fL (ref 80.0–100.0)
Platelets: 189 K/uL (ref 150–400)
RBC: 3.77 MIL/uL — ABNORMAL LOW (ref 3.87–5.11)
RDW: 13.4 % (ref 11.5–15.5)
WBC: 5.4 K/uL (ref 4.0–10.5)
nRBC: 0 % (ref 0.0–0.2)

## 2023-10-16 LAB — BASIC METABOLIC PANEL WITH GFR
Anion gap: 7 (ref 5–15)
BUN: 7 mg/dL — ABNORMAL LOW (ref 8–23)
CO2: 24 mmol/L (ref 22–32)
Calcium: 8.6 mg/dL — ABNORMAL LOW (ref 8.9–10.3)
Chloride: 107 mmol/L (ref 98–111)
Creatinine, Ser: 0.51 mg/dL (ref 0.44–1.00)
GFR, Estimated: >60 mL/min (ref >60)
Glucose, Bld: 92 mg/dL (ref 70–99)
Potassium: 4.4 mmol/L (ref 3.5–5.1)
Sodium: 138 mmol/L (ref 135–145)

## 2023-10-16 LAB — MRSA NEXT GEN BY PCR, NASAL: MRSA by PCR Next Gen: NOT DETECTED

## 2023-10-16 SURGERY — LAPAROSCOPIC CHOLECYSTECTOMY WITH INTRAOPERATIVE CHOLANGIOGRAM
Anesthesia: General | Site: Abdomen

## 2023-10-16 MED ORDER — LACTATED RINGERS IV SOLN: INTRAVENOUS | Status: DC

## 2023-10-16 MED ORDER — SODIUM CHLORIDE 0.9 % IV SOLN
INTRAVENOUS | Status: DC | PRN
  Administered 2023-10-16 (×2): 25 mL

## 2023-10-16 MED ORDER — METHOCARBAMOL 500 MG PO TABS: 500.0000 mg | ORAL_TABLET | Freq: Four times a day (QID) | ORAL | Status: DC | PRN

## 2023-10-16 MED ORDER — CHLORHEXIDINE GLUCONATE 0.12 % MT SOLN
OROMUCOSAL | Status: AC
  Administered 2023-10-16 (×2): 15 mL via OROMUCOSAL
  Filled 2023-10-16: qty 15

## 2023-10-16 MED ORDER — LIDOCAINE 2% (20 MG/ML) 5 ML SYRINGE
INTRAMUSCULAR | Status: AC
  Filled 2023-10-16: qty 5

## 2023-10-16 MED ORDER — ACETAMINOPHEN 500 MG PO TABS
1000.0000 mg | ORAL_TABLET | Freq: Once | ORAL | Status: AC
  Administered 2023-10-16 (×2): 1000 mg via ORAL
  Filled 2023-10-16: qty 2

## 2023-10-16 MED ORDER — OXYCODONE HCL 5 MG PO TABS: 5.0000 mg | ORAL_TABLET | Freq: Once | ORAL | Status: DC | PRN

## 2023-10-16 MED ORDER — KETAMINE HCL 50 MG/5ML IJ SOSY
PREFILLED_SYRINGE | INTRAMUSCULAR | Status: AC
Start: 2023-10-16 — End: 2023-10-16
  Filled 2023-10-16: qty 5

## 2023-10-16 MED ORDER — PROPOFOL 10 MG/ML IV BOLUS
INTRAVENOUS | Status: AC
  Filled 2023-10-16: qty 20

## 2023-10-16 MED ORDER — SODIUM CHLORIDE 0.9 % IR SOLN
Status: DC | PRN
  Administered 2023-10-16 (×2): 1

## 2023-10-16 MED ORDER — ONDANSETRON HCL 4 MG/2ML IJ SOLN: 4.0000 mg | Freq: Once | INTRAMUSCULAR | Status: DC | PRN

## 2023-10-16 MED ORDER — DEXAMETHASONE SODIUM PHOSPHATE 10 MG/ML IJ SOLN
INTRAMUSCULAR | Status: AC
  Filled 2023-10-16: qty 1

## 2023-10-16 MED ORDER — PROPOFOL 10 MG/ML IV BOLUS
INTRAVENOUS | Status: DC | PRN
  Administered 2023-10-16 (×2): 80 mg via INTRAVENOUS

## 2023-10-16 MED ORDER — ORAL CARE MOUTH RINSE: 15.0000 mL | Freq: Once | OROMUCOSAL | Status: AC

## 2023-10-16 MED ORDER — HYDROMORPHONE HCL 1 MG/ML IJ SOLN
INTRAMUSCULAR | Status: AC
  Filled 2023-10-16: qty 1

## 2023-10-16 MED ORDER — HEMOSTATIC AGENTS (NO CHARGE) OPTIME
TOPICAL | Status: DC | PRN
  Administered 2023-10-16 (×2): 1 via TOPICAL

## 2023-10-16 MED ORDER — ROCURONIUM BROMIDE 10 MG/ML (PF) SYRINGE
PREFILLED_SYRINGE | INTRAVENOUS | Status: DC | PRN
  Administered 2023-10-16 (×2): 40 mg via INTRAVENOUS

## 2023-10-16 MED ORDER — MIDAZOLAM HCL 2 MG/2ML IJ SOLN
INTRAMUSCULAR | Status: AC
  Filled 2023-10-16: qty 2

## 2023-10-16 MED ORDER — LIDOCAINE 2% (20 MG/ML) 5 ML SYRINGE
INTRAMUSCULAR | Status: DC | PRN
  Administered 2023-10-16 (×2): 50 mg via INTRAVENOUS

## 2023-10-16 MED ORDER — AMISULPRIDE (ANTIEMETIC) 5 MG/2ML IV SOLN
10.0000 mg | Freq: Once | INTRAVENOUS | Status: AC | PRN
  Administered 2023-10-16 (×2): 10 mg via INTRAVENOUS

## 2023-10-16 MED ORDER — OXYCODONE HCL 5 MG/5ML PO SOLN: 5.0000 mg | Freq: Once | ORAL | Status: DC | PRN

## 2023-10-16 MED ORDER — EPHEDRINE SULFATE-NACL 50-0.9 MG/10ML-% IV SOSY
PREFILLED_SYRINGE | INTRAVENOUS | Status: DC | PRN
  Administered 2023-10-16 (×2): 10 mg via INTRAVENOUS

## 2023-10-16 MED ORDER — ACETAMINOPHEN 500 MG PO TABS
1000.0000 mg | ORAL_TABLET | Freq: Four times a day (QID) | ORAL | Status: DC
  Administered 2023-10-16 – 2023-10-17 (×8): 1000 mg via ORAL
  Filled 2023-10-16 (×4): qty 2

## 2023-10-16 MED ORDER — ONDANSETRON HCL 4 MG/2ML IJ SOLN
INTRAMUSCULAR | Status: DC | PRN
  Administered 2023-10-16 (×2): 4 mg via INTRAVENOUS

## 2023-10-16 MED ORDER — FENTANYL CITRATE (PF) 250 MCG/5ML IJ SOLN
INTRAMUSCULAR | Status: AC
  Filled 2023-10-16: qty 5

## 2023-10-16 MED ORDER — FENTANYL CITRATE (PF) 250 MCG/5ML IJ SOLN
INTRAMUSCULAR | Status: DC | PRN
  Administered 2023-10-16: 50 ug via INTRAVENOUS
  Administered 2023-10-16: 25 ug via INTRAVENOUS
  Administered 2023-10-16: 50 ug via INTRAVENOUS
  Administered 2023-10-16 (×5): 25 ug via INTRAVENOUS

## 2023-10-16 MED ORDER — HYDROMORPHONE HCL 1 MG/ML IJ SOLN
0.2500 mg | INTRAMUSCULAR | Status: DC | PRN
  Administered 2023-10-16: 0.5 mg via INTRAVENOUS
  Administered 2023-10-16 (×2): 0.25 mg via INTRAVENOUS
  Administered 2023-10-16: 0.5 mg via INTRAVENOUS

## 2023-10-16 MED ORDER — KETOROLAC TROMETHAMINE 30 MG/ML IJ SOLN: 15.0000 mg | Freq: Once | INTRAMUSCULAR | Status: DC | PRN

## 2023-10-16 MED ORDER — CHLORHEXIDINE GLUCONATE 0.12 % MT SOLN: 15.0000 mL | Freq: Once | OROMUCOSAL | Status: AC

## 2023-10-16 MED ORDER — MIDAZOLAM HCL 2 MG/2ML IJ SOLN
INTRAMUSCULAR | Status: DC | PRN
  Administered 2023-10-16 (×4): 1 mg via INTRAVENOUS

## 2023-10-16 MED ORDER — PHENYLEPHRINE 80 MCG/ML (10ML) SYRINGE FOR IV PUSH (FOR BLOOD PRESSURE SUPPORT)
PREFILLED_SYRINGE | INTRAVENOUS | Status: DC | PRN
  Administered 2023-10-16 (×2): 80 ug via INTRAVENOUS
  Administered 2023-10-16 (×2): 160 ug via INTRAVENOUS
  Administered 2023-10-16 (×2): 80 ug via INTRAVENOUS

## 2023-10-16 MED ORDER — 0.9 % SODIUM CHLORIDE (POUR BTL) OPTIME
TOPICAL | Status: DC | PRN
  Administered 2023-10-16 (×2): 1000 mL

## 2023-10-16 MED ORDER — ROCURONIUM BROMIDE 10 MG/ML (PF) SYRINGE
PREFILLED_SYRINGE | INTRAVENOUS | Status: AC
  Filled 2023-10-16: qty 10

## 2023-10-16 MED ORDER — KETAMINE HCL 10 MG/ML IJ SOLN
INTRAMUSCULAR | Status: DC | PRN
Start: 2023-10-16 — End: 2023-10-16
  Administered 2023-10-16 (×2): 20 mg via INTRAVENOUS

## 2023-10-16 MED ORDER — BUPIVACAINE-EPINEPHRINE (PF) 0.25% -1:200000 IJ SOLN
INTRAMUSCULAR | Status: AC
  Filled 2023-10-16: qty 30

## 2023-10-16 MED ORDER — SUGAMMADEX SODIUM 200 MG/2ML IV SOLN
INTRAVENOUS | Status: DC | PRN
  Administered 2023-10-16 (×2): 100 mg via INTRAVENOUS

## 2023-10-16 MED ORDER — ONDANSETRON HCL 4 MG/2ML IJ SOLN
INTRAMUSCULAR | Status: AC
  Filled 2023-10-16: qty 2

## 2023-10-16 MED ORDER — DEXAMETHASONE SODIUM PHOSPHATE 10 MG/ML IJ SOLN
INTRAMUSCULAR | Status: DC | PRN
  Administered 2023-10-16 (×2): 5 mg via INTRAVENOUS

## 2023-10-16 MED ORDER — BUPIVACAINE-EPINEPHRINE 0.25% -1:200000 IJ SOLN
INTRAMUSCULAR | Status: DC | PRN
  Administered 2023-10-16 (×2): 30 mL

## 2023-10-16 MED ORDER — OXYCODONE HCL 5 MG PO TABS: 5.0000 mg | ORAL_TABLET | ORAL | Status: DC | PRN

## 2023-10-16 MED ORDER — AMISULPRIDE (ANTIEMETIC) 5 MG/2ML IV SOLN
INTRAVENOUS | Status: AC
  Filled 2023-10-16: qty 4

## 2023-10-16 SURGICAL SUPPLY — 43 items
CANISTER SUCTION 3000ML PPV (SUCTIONS) ×1 IMPLANT
CATH ROBINSON RED A/P 16FR (CATHETERS) IMPLANT
CATH URETL OPEN 5X70 (CATHETERS) ×1 IMPLANT
CATH URETL OPEN END 6FR 70 (CATHETERS) IMPLANT
CHLORAPREP W/TINT 26 (MISCELLANEOUS) ×1 IMPLANT
CLIP APPLIE ROT 10 11.4 M/L (STAPLE) ×1 IMPLANT
COVER MAYO STAND STRL (DRAPES) IMPLANT
COVER SURGICAL LIGHT HANDLE (MISCELLANEOUS) ×1 IMPLANT
DERMABOND ADVANCED .7 DNX12 (GAUZE/BANDAGES/DRESSINGS) ×1 IMPLANT
DRAPE C-ARM 42X120 X-RAY (DRAPES) ×1 IMPLANT
ELECTRODE REM PT RTRN 9FT ADLT (ELECTROSURGICAL) ×1 IMPLANT
ENDOLOOP SUT PDS II 0 18 (SUTURE) IMPLANT
GLOVE BIO SURGEON STRL SZ7.5 (GLOVE) ×1 IMPLANT
GLOVE BIOGEL PI IND STRL 8 (GLOVE) ×1 IMPLANT
GOWN STRL REUS W/ TWL LRG LVL3 (GOWN DISPOSABLE) ×2 IMPLANT
GOWN STRL REUS W/ TWL XL LVL3 (GOWN DISPOSABLE) ×1 IMPLANT
GRASPER SUT TROCAR 14GX15 (MISCELLANEOUS) ×1 IMPLANT
HEMOSTAT HEMOBLAST BELLOWS (HEMOSTASIS) IMPLANT
IRRIGATION SUCT STRKRFLW 2 WTP (MISCELLANEOUS) ×1 IMPLANT
KIT BASIN OR (CUSTOM PROCEDURE TRAY) ×1 IMPLANT
KIT IMAGING PINPOINTPAQ (MISCELLANEOUS) IMPLANT
KIT TURNOVER KIT B (KITS) ×1 IMPLANT
NDL 22X1.5 STRL (OR ONLY) (MISCELLANEOUS) ×1 IMPLANT
NDL INSUFFLATION 14GA 120MM (NEEDLE) ×1 IMPLANT
NEEDLE 22X1.5 STRL (OR ONLY) (MISCELLANEOUS) ×1 IMPLANT
NEEDLE INSUFFLATION 14GA 120MM (NEEDLE) ×1 IMPLANT
NS IRRIG 1000ML POUR BTL (IV SOLUTION) ×1 IMPLANT
PAD ARMBOARD POSITIONER FOAM (MISCELLANEOUS) ×1 IMPLANT
POUCH RETRIEVAL ECOSAC 10 (ENDOMECHANICALS) ×1 IMPLANT
SCISSORS LAP 5X35 DISP (ENDOMECHANICALS) ×1 IMPLANT
SET CHOLANGIOGRAPH 5 50 .035 (SET/KITS/TRAYS/PACK) IMPLANT
SET TUBE SMOKE EVAC HIGH FLOW (TUBING) ×1 IMPLANT
SLEEVE Z-THREAD 5X100MM (TROCAR) ×2 IMPLANT
SPECIMEN JAR SMALL (MISCELLANEOUS) ×1 IMPLANT
STOPCOCK 4 WAY LG BORE MALE ST (IV SETS) ×1 IMPLANT
SUT MNCRL AB 4-0 PS2 18 (SUTURE) ×1 IMPLANT
SUT VICRYL 0 UR6 27IN ABS (SUTURE) IMPLANT
TOWEL GREEN STERILE (TOWEL DISPOSABLE) ×1 IMPLANT
TRAY LAPAROSCOPIC MC (CUSTOM PROCEDURE TRAY) ×2 IMPLANT
TROCAR 11X100 Z THREAD (TROCAR) ×1 IMPLANT
TROCAR Z-THREAD OPTICAL 5X100M (TROCAR) ×1 IMPLANT
WARMER LAPAROSCOPE (MISCELLANEOUS) ×1 IMPLANT
WATER STERILE IRR 1000ML POUR (IV SOLUTION) ×1 IMPLANT

## 2023-10-16 NOTE — Plan of Care (Signed)

## 2023-10-16 NOTE — Progress Notes (Signed)
 Progress Note: General Surgery Service   Chief Complaint/Subjective: Anticipating surgery.  Headache  Objective: Vital signs in last 24 hours: Temp:  [97.6 F (36.4 C)-98.5 F (36.9 C)] 98.2 F (36.8 C) (08/12 0825) Pulse Rate:  [60-75] 65 (08/12 0825) Resp:  [16-18] 17 (08/12 0825) BP: (99-123)/(57-71) 103/58 (08/12 0825) SpO2:  [97 %-100 %] 97 % (08/12 0825) Weight:  [52.6 kg] 52.6 kg (08/11 1124) Last BM Date : 10/14/23  Intake/Output from previous day: 08/11 0701 - 08/12 0700 In: 1100 [IV Piggyback:1100] Out: -  Intake/Output this shift: No intake/output data recorded.  Constitutional: NAD; conversant; no deformities Eyes: Moist conjunctiva; no lid lag; anicteric; PERRL Neck: Trachea midline; no thyromegaly Lungs: Normal respiratory effort; no tactile fremitus CV: RRR; no palpable thrills; no pitting edema GI: Abd Tender RUQ; no palpable hepatosplenomegaly MSK: Normal range of motion of extremities; no clubbing/cyanosis Psychiatric: Appropriate affect; alert and oriented x3 Lymphatic: No palpable cervical or axillary lymphadenopathy  Lab Results: CBC  Recent Labs    10/15/23 2030 10/16/23 0615  WBC 7.6 5.4  HGB 11.8* 11.7*  HCT 36.1 35.9*  PLT 193 189   BMET Recent Labs    10/15/23 1145 10/15/23 2030 10/16/23 0615  NA 141  --  138  K 3.7  --  4.4  CL 106  --  107  CO2 22  --  24  GLUCOSE 102*  --  92  BUN 11  --  7*  CREATININE 0.47 0.50 0.51  CALCIUM  8.9  --  8.6*   PT/INR No results for input(s): LABPROT, INR in the last 72 hours. ABG No results for input(s): PHART, HCO3 in the last 72 hours.  Invalid input(s): PCO2, PO2  Anti-infectives: Anti-infectives (From admission, onward)    Start     Dose/Rate Route Frequency Ordered Stop   10/15/23 1845  [MAR Hold]  cefTRIAXone  (ROCEPHIN ) 2 g in sodium chloride  0.9 % 100 mL IVPB        (MAR Hold since Tue 10/16/2023 at 0817.Hold Reason: Transfer to a Procedural area)   2 g 200 mL/hr  over 30 Minutes Intravenous Every 24 hours 10/15/23 1842 10/22/23 1844       Medications: Scheduled Meds:  [MAR Hold] acetaminophen   650 mg Oral Q6H   [MAR Hold] docusate sodium   100 mg Oral BID   [MAR Hold] enoxaparin  (LOVENOX ) injection  40 mg Subcutaneous Q24H   [MAR Hold] ketorolac   15 mg Intravenous Q8H   Continuous Infusions:  [MAR Hold] cefTRIAXone  (ROCEPHIN )  IV Stopped (10/15/23 1949)   lactated ringers  75 mL/hr at 10/16/23 0819   lactated ringers  10 mL/hr at 10/16/23 0833   PRN Meds:.[MAR Hold]  HYDROmorphone  (DILAUDID ) injection, [MAR Hold] methocarbamol  (ROBAXIN ) injection, [MAR Hold] ondansetron  (ZOFRAN ) IV, [MAR Hold] oxyCODONE , [MAR Hold] oxyCODONE , [MAR Hold] prochlorperazine , [MAR Hold] simethicone   Assessment/Plan: s/p Procedure(s): LAPAROSCOPIC CHOLECYSTECTOMY WITH INTRAOPERATIVE CHOLANGIOGRAM 10/16/2023  [name] is a 63 year old female with abdominal pain found to have cholecystitis on imaging.  I recommended laparoscopic cholecystectomy with intra-operative cholangiogram.  We discussed the procedure, its risks, benefits and alternatives and the patient granted consent to proceed.  We will proceed as scheduled.   LOS: 1 day   Robin JINNY Foy, MD  [facility] Use [email or web address] to contact on call provider  Daily Billing: [phone] - Straightforward / Low MDM

## 2023-10-16 NOTE — Anesthesia Procedure Notes (Signed)
 Procedure Name: Intubation Date/Time: 10/16/2023 9:43 AM  Performed by: Atanacio Arland HERO, CRNAPre-anesthesia Checklist: Patient identified, Emergency Drugs available, Suction available and Patient being monitored Patient Re-evaluated:Patient Re-evaluated prior to induction Oxygen Delivery Method: Circle System Utilized Preoxygenation: Pre-oxygenation with 100% oxygen Induction Type: IV induction Ventilation: Mask ventilation without difficulty Laryngoscope Size: Mac and 4 Grade View: Grade I Tube type: Oral Tube size: 7.0 mm Number of attempts: 1 Airway Equipment and Method: Stylet Placement Confirmation: ETT inserted through vocal cords under direct vision, positive ETCO2 and breath sounds checked- equal and bilateral Secured at: 21 cm Tube secured with: Tape Dental Injury: Teeth and Oropharynx as per pre-operative assessment

## 2023-10-16 NOTE — Progress Notes (Signed)
 As per Dr. Rubin, no new orders and keep monitoring patient.

## 2023-10-16 NOTE — Anesthesia Postprocedure Evaluation (Signed)
 Anesthesia Post Note  Patient: Zyiah Withington Mccannon  Procedure(s) Performed: LAPAROSCOPIC CHOLECYSTECTOMY WITH INTRAOPERATIVE CHOLANGIOGRAM (Abdomen)     Patient location during evaluation: PACU Anesthesia Type: General Level of consciousness: awake and alert, oriented and patient cooperative Pain management: pain level controlled Vital Signs Assessment: post-procedure vital signs reviewed and stable Respiratory status: spontaneous breathing, nonlabored ventilation and respiratory function stable Cardiovascular status: blood pressure returned to baseline and stable Postop Assessment: no apparent nausea or vomiting Anesthetic complications: no   No notable events documented.  Last Vitals:  Vitals:   10/16/23 1200 10/16/23 1215  BP: (!) 107/59 (!) 100/52  Pulse: 69 73  Resp: 13 13  Temp:  36.7 C  SpO2: 98% 93%    Last Pain:  Vitals:   10/16/23 1200  TempSrc:   PainSc: Asleep                 Almarie CHRISTELLA Marchi

## 2023-10-16 NOTE — Transfer of Care (Signed)
 Immediate Anesthesia Transfer of Care Note  Patient: [name]  Procedure(s) Performed: LAPAROSCOPIC CHOLECYSTECTOMY WITH INTRAOPERATIVE CHOLANGIOGRAM  Patient Location: PACU  Anesthesia Type:General  Level of Consciousness: drowsy  Airway & Oxygen Therapy: Patient Spontanous Breathing and Patient connected to nasal cannula oxygen  Post-op Assessment: Report given to RN and Post -op Vital signs reviewed and stable  Post vital signs: Reviewed and stable  Last Vitals:  Vitals Value Taken Time  BP 121/52   Temp 98.1   Pulse 71   Resp 10   SpO2 99     Last Pain:  Vitals:   10/16/23 0825  TempSrc: Oral  PainSc: 0-No pain      Patients Stated Pain Goal: 1 (10/16/23 0112)  Complications: No notable events documented.

## 2023-10-16 NOTE — Plan of Care (Signed)
   Problem: Coping: Goal: Level of anxiety will decrease Outcome: Progressing   Problem: Pain Managment: Goal: General experience of comfort will improve and/or be controlled Outcome: Progressing

## 2023-10-16 NOTE — Op Note (Signed)
 Patient: [name] (1960-09-15, [id number])  Date of Surgery: 10/16/2023  Preoperative Diagnosis: Cholecystitis   Postoperative Diagnosis: Cholecystitis   Surgical Procedure: LAPAROSCOPIC CHOLECYSTECTOMY WITH INTRAOPERATIVE CHOLANGIOGRAM    Operative Team Members:  Surgeons and Role:    * Halle Davlin, Deward PARAS, MD - Primary   Anesthesiologist: Merla Almarie HERO, DO CRNA: Atanacio Arland HERO, CRNA   Anesthesia: General   Fluids:  No intake/output data recorded.  Complications: None  Drains:  none   Specimen:  ID Type Source Tests Collected by Time Destination  1 : gallbladder Tissue PATH Gallbladder SURGICAL PATHOLOGY Nakyah Erdmann, Deward PARAS, MD 10/16/2023 1035      Disposition:  PACU - hemodynamically stable.  Plan of Care: Continue inpatient care    Indications for Procedure: [name] is a 63 y.o. female who presented with abdominal pain.  History, physical and imaging was concerning for cholecystitis.  Laparoscopic cholecystectomy was recommended for the patient.  The procedure itself, as well as the risks, benefits and alternatives were discussed with the patient.  Risks discussed included but were not limited to the risk of infection, bleeding, damage to nearby structures, need to convert to open procedure, incisional hernia, bile leak, common bile duct injury and the need for additional procedures or surgeries.  With this discussion complete and all questions answered the patient granted consent to proceed.  Findings: Inflamed gallbaldder, negative IOC  Infection status: Patient: [name] Emergency General Surgery Service Patient Case: Urgent Infection Present At Time Of Surgery (PATOS): None   Description of Procedure:   On the date stated above, the patient was taken to the operating room suite and placed in supine positioning.  Sequential compression devices were placed on the lower extremities to prevent blood clots.  General endotracheal  anesthesia was induced. Preoperative antibiotics were given.  The patient's abdomen was prepped and draped in the usual sterile fashion.  A time-out was completed verifying the correct patient, procedure, positioning and equipment needed for the case.  We began by anesthetizing the skin with local anesthetic and then making a 5 mm incision just below the umbilicus.  We dissected through the subcutaneous tissues to the fascia.  The fascia was grasped and elevated using a Kocher clamp.  A Veress needle was inserted into the abdomen and the abdomen was insufflated to 15 mmHg.  A 5 mm trocar was inserted in this position under optical guidance and then the abdomen was inspected.  There was no trauma to the underlying viscera with initial trocar placement.  Any abnormal findings, other than inflammation in the right upper quadrant, are listed above in the findings section.  Three additional trocars were placed, one 12 mm trocar in the subxiphoid position, one 5 mm trocar in the midline epigastric area and one 5mm trocar in the right upper quadrant subcostally.  These were placed under direct vision without any trauma to the underlying viscera.    The patient was then placed in head up, left side down positioning.  The gallbladder was identified and dissected free from its attachments to the omentum allowing the duodenum to fall away.  The infundibulum of the gallbladder was dissected free working laterally to medially.  The cystic duct and cystic artery were dissected free from surrounding connective tissue.  The infundibulum of the gallbladder was dissected off the cystic plate.  A critical view of safety was obtained with the cystic duct and cystic artery being cleared of connective tissues and clearly the only two structures entering  into the gallbladder with the liver clearly visible behind.  One clip was applied high on the cystic duct.  A small ductotomy was created below this using the endoscopic shears.  A  cholangiogram catheter was introduced through the abdominal wall and into the cystic duct through this ductotomy.  The catheter was clipped into position.  The catheter was flushed to ensure no leakage around the clip.  We then removed the laparoscopic instruments and positioned the C-Arm to perform a cholangiogram.  The catheter was flushed with contrast under fluoroscopic visualization and a cholangiogram was obtained.  The cholangiogram visualized the biliary tree from the ampulla up to the first two biliary radicals in the liver.  There were no filling defects identified.  The catheter clearly entered the cystic duct.  There was gradual tapering of the common bile duct down to the ampulla without evidence of stricture or other abnormalities.  Please see the EMR for saved representative images.  With our cholangiogram compete, we moved the c-arm away from the field and returned to laparoscopic surgery.    Clips were then applied to the cystic duct and cystic artery and then these structures were divided.  A PDS endoloop was placed to secure the cystic duct.  The gallbladder was dissected off the cystic plate, placed in an endocatch bag and removed from the 12 mm subxiphoid port site.  The clips were inspected and appeared effective.  The cystic plate was inspected and hemostasis was obtained using electrocautery.  A suction irrigator was used to clean the operative field.  Hemoblast was applied to the gallbladder fossa.  Attention was turned to closure.  The 12 mm subxiphoid port site was closed using a 0-vicryl suture on a fascial suture passer.  The abdomen was desufflated.  The skin was closed using 4-0 monocryl and dermabond.  All sponge and needle counts were correct at the conclusion of the case.    Deward Foy, MD General, Bariatric, & Minimally Invasive Surgery [facility] [place]

## 2023-10-16 NOTE — Anesthesia Preprocedure Evaluation (Addendum)
 Anesthesia Evaluation  Patient identified by MRN, date of birth, ID band Patient awake    Reviewed: Allergy & Precautions, NPO status , Patient's Chart, lab work & pertinent test results  Airway Mallampati: II  TM Distance: >3 FB Neck ROM: Full    Dental  (+) Edentulous Upper, Edentulous Lower   Pulmonary asthma (no inhalers)    Pulmonary exam normal breath sounds clear to auscultation       Cardiovascular negative cardio ROS Normal cardiovascular exam Rhythm:Regular Rate:Normal     Neuro/Psych  Headaches PSYCHIATRIC DISORDERS Anxiety        GI/Hepatic Neg liver ROS,GERD  Controlled,,  Endo/Other  negative endocrine ROS    Renal/GU negative Renal ROS  negative genitourinary   Musculoskeletal negative musculoskeletal ROS (+)    Abdominal   Peds  Hematology negative hematology ROS (+) Hb 11.7   Anesthesia Other Findings   Reproductive/Obstetrics negative OB ROS                              Anesthesia Physical Anesthesia Plan  ASA: 2  Anesthesia Plan: General   Post-op Pain Management: Tylenol  PO (pre-op)*, Toradol  IV (intra-op)* and Ketamine  IV*   Induction: Intravenous  PONV Risk Score and Plan: 3 and Ondansetron , Dexamethasone , Midazolam  and Treatment may vary due to age or medical condition  Airway Management Planned: Oral ETT  Additional Equipment: None  Intra-op Plan:   Post-operative Plan: Extubation in OR  Informed Consent: I have reviewed the patients History and Physical, chart, labs and discussed the procedure including the risks, benefits and alternatives for the proposed anesthesia with the patient or authorized representative who has indicated his/her understanding and acceptance.     Dental advisory given  Plan Discussed with: CRNA  Anesthesia Plan Comments:          Anesthesia Quick Evaluation

## 2023-10-16 NOTE — Progress Notes (Signed)
   10/16/23 1652  Vitals  BP (!) 91/47  MEWS COLOR  MEWS Score Color Yellow  MEWS Score  MEWS Temp 0  MEWS Systolic 1  MEWS Pulse 0  MEWS RR 0  MEWS LOC 1  MEWS Score 2   BP 91/40's, taken multiple times. feeling lightheaded and slighty nauseous. PRN IV zofran  given.  LR infusing at 57ml/hr . Patient ambulated to bathroom with assistance. Is currently laying in bed comfortably.  General surgery paged.

## 2023-10-16 NOTE — Progress Notes (Signed)
 Pt arrived to unit from PACU VSS and afebrile Reports mild pain.  Incentive spirometer at bedside, educated given.  On clear liquid diet, pt ordering now

## 2023-10-16 NOTE — Progress Notes (Signed)
 Advancing patient to full liquid diet, tolerated clear liquids.

## 2023-10-17 ENCOUNTER — Encounter (HOSPITAL_COMMUNITY): Payer: Self-pay | Admitting: Surgery

## 2023-10-17 ENCOUNTER — Other Ambulatory Visit (HOSPITAL_COMMUNITY): Payer: Self-pay

## 2023-10-17 MED ORDER — OXYCODONE HCL 5 MG PO TABS
5.0000 mg | ORAL_TABLET | Freq: Four times a day (QID) | ORAL | 0 refills | Status: DC | PRN
  Filled 2023-10-17: qty 15, 4d supply, fill #0

## 2023-10-17 MED ORDER — ACETAMINOPHEN 500 MG PO TABS
1000.0000 mg | ORAL_TABLET | Freq: Three times a day (TID) | ORAL | Status: AC | PRN
Start: 2023-10-17 — End: ?

## 2023-10-17 NOTE — Plan of Care (Signed)

## 2023-10-17 NOTE — Discharge Summary (Signed)
 Patient ID: [name] [id number] 1960-07-19 63 y.o.  Admit date: 10/15/2023 Discharge date: 10/17/2023   Discharge Diagnosis S/p laparoscopic cholecystectomy with IOC by Dr. Lyndel on 10/16/23 for Cholecystitis   Consultants None  HPI: [name] is an 63 y.o. female who is here for abdominal pain.  She has had multiple attacks of abdominal pain, two over the last week.  It happens at night.  It is located in the epigastric area but has also radiated into her back and into her right shoulder.  She has recently been treated for H. Pylori but has not had an upper endoscopy.  She does not eat a lot of fatty foods. She deals with heartburn.   Procedures Dr. Lyndel - 10/16/23 - Laparoscopic Cholecystectomy with [facility]  Hospital Course:  The patient was admitted and underwent a laparoscopic cholecystectomy with IOC. IOC negative. The patient tolerated the procedure well.  On POD 1, the patient was tolerating a regular diet, voiding well, mobilizing, and pain was controlled with oral pain medications.  The patient was stable for DC home at this time with appropriate follow up made. Discussed discharge instructions, restrictions and return/call back precautions. A note was provided for work - she reports she does not have light duty option.   Noted BP 96/62 at d/c. Patient reports this is her baseline BP (90's/60's) and she did have some similar readings pre-op. I also see in a note from 06/23/23 that her office B was 96/62 at that time which seems consistent with what she said about her baseline BP running 90's/60's.   Physical Exam: Gen:  Alert, NAD, pleasant Card:  Reg Pulm:  CTAB, no W/R/R, effort normal Abd: Soft, no distension, appropriately tender around laparoscopic incisions, no rigidity or guarding and otherwise NT, +BS. Incisions with glue intact appears well and are without drainage, bleeding, or signs of infection  Psych: A&Ox3    Allergies as of 10/17/2023        Reactions   Clindamycin Diarrhea   Gabapentin    Caused headaches and dizziness        Medication List     STOP taking these medications    ibuprofen  200 MG tablet Commonly known as: ADVIL        TAKE these medications    acetaminophen  500 MG tablet Commonly known as: TYLENOL  Take 2 tablets (1,000 mg total) by mouth every 8 (eight) hours as needed. What changed:  how much to take when to take this   celecoxib 200 MG capsule Commonly known as: CELEBREX Take 200 mg by mouth daily as needed for mild pain (pain score 1-3).   cholecalciferol 25 MCG (1000 UNIT) tablet Commonly known as: VITAMIN D3 Take 1,000 Units by mouth daily.   cyanocobalamin 1000 MCG tablet Commonly known as: VITAMIN B12 Take 1,000 mcg by mouth daily.   estradiol 0.1 MG/GM vaginal cream Commonly known as: ESTRACE   famotidine  20 MG tablet Commonly known as: PEPCID  Take 1 tablet (20 mg total) by mouth 2 (two) times daily.   oxyCODONE  5 MG immediate release tablet Commonly known as: Oxy IR/ROXICODONE  Take 1 tablet (5 mg total) by mouth every 6 (six) hours as needed for breakthrough pain.   promethazine -dextromethorphan 6.25-15 MG/5ML syrup Commonly known as: PROMETHAZINE -DM Take 5 mLs by mouth every 8 (eight) hours as needed for cough.          Follow-up Information     Eldridge Marcott, Puja Gosai, PA-C Follow up on 11/06/2023.  Specialty: General Surgery Why: 9/2 at 1:45. Please, bring a copy of your photo ID, insurance card and arrive 30 minutes prior to your appointment for paperwork. Contact information: [address] [place] [address] [phone]                 Signed: [name], [facility] 10/17/2023, 9:17 AM Please see Amion for pager number during day hours 7:00am-4:30pm

## 2023-10-17 NOTE — Plan of Care (Signed)
  Problem: Safety: Goal: Ability to remain free from injury will improve Outcome: Progressing   Problem: Pain Managment: Goal: General experience of comfort will improve and/or be controlled Outcome: Progressing   Problem: Elimination: Goal: Will not experience complications related to bowel motility Outcome: Progressing

## 2023-10-17 NOTE — Progress Notes (Signed)
 Explained discharge instructions to patient. Reviewed follow up appointment and next medication administration times. Also reviewed education. Patient verbalized having an understanding for instructions given. All belongings are in the patient's possession to include TOC meds. IVs were removed. No other needs verbalized. Volunteer services will transport downstairs for discharge.

## 2023-10-17 NOTE — Telephone Encounter (Signed)
 OK to schedule in my clinic or APP clinic, whichever is earlier RG

## 2023-10-17 NOTE — TOC Initial Note (Signed)
 Transition of Care [facility]) - Initial/Assessment Note    Patient Details  Name: [name] MRN: [id number] Date of Birth: September 20, 1960  [facility] of [facility] CM/SW Contact:    Robin LITTIE Moose, LCSW Phone Number: 10/17/2023, 9:08 AM  Clinical Narrative:                 Pt admitted from home due to epigastric pain. No current TOC needs, please consult as needs arise.        Patient Goals and CMS Choice            Expected Discharge Plan and Services                                              Prior Living Arrangements/Services                       Activities of Daily Living   ADL Screening (condition at time of admission) Independently performs ADLs?: Yes (appropriate for developmental age) Is the patient deaf or have difficulty hearing?: No Does the patient have difficulty seeing, even when wearing glasses/contacts?: No Does the patient have difficulty concentrating, remembering, or making decisions?: No  Permission Sought/Granted                  Emotional Assessment              Admission diagnosis:  Acute cholecystitis [K81.0] Biliary colic [K80.50] Epigastric pain [R10.13] Gallstones [K80.20] Right upper quadrant pain [R10.11] Elevated liver function tests [R79.89] Hematuria, unspecified type [R31.9] Patient Active Problem List   Diagnosis Date Noted   Acute cholecystitis 10/15/2023   Vitamin D  deficiency 12/02/2021   B12 deficiency 12/02/2021   Atherosclerosis of aorta (HCC) 12/02/2021   Androgenic alopecia 02/15/2021   Eosinophilia 12/19/2019   Osteoporosis 08/10/2018   GERD (gastroesophageal reflux disease) 07/11/2017   Annual physical exam 03/20/2016   PCP NOTES >>>>>>>>>>> 02/20/2016   Anxiety state 02/20/2016   Microscopic hematuria 01/11/2016   Nocturia 01/11/2016   Chest pain 03/03/2014   Palpitations 03/03/2014   Asthma 07/28/2009   Constipation, chronic 07/28/2009   PCP:  Amon Aloysius BRAVO, MD Pharmacy:    [facility] [id number] - [place], [place] - [address] [place] [address] Phone: [phone] Fax: [phone]     Social Drivers of Health (SDOH) Social History: SDOH Screenings   Food Insecurity: No Food Insecurity (10/16/2023)  Housing: Low Risk  (10/16/2023)  Transportation Needs: No Transportation Needs (10/16/2023)  Utilities: Not At Risk (10/16/2023)  Depression (PHQ2-9): Low Risk  (01/10/2023)  Financial Resource Strain: Low Risk  (07/08/2023)   Received from [facility]  Physical Activity: Sufficiently Active (07/08/2023)   Received from [facility]  Social Connections: Somewhat Isolated (07/08/2023)   Received from [facility]  Stress: Stress Concern Present (07/08/2023)   Received from [facility]  Tobacco Use: Low Risk  (10/16/2023)   SDOH Interventions:     Readmission Risk Interventions     No data to display

## 2023-10-18 LAB — SURGICAL PATHOLOGY

## 2023-10-29 ENCOUNTER — Telehealth: Payer: Self-pay

## 2023-10-29 NOTE — Telephone Encounter (Signed)
 Tried speaking w/ Pt's husband, Lyndy to explain that Dr. Melodye should be ordering the bone density test per PCP and it will need to be completed at [facility] (same location in 2020). Ronnie then tells me it is already scheduled for 11/01/23 downstairs at our imaging center, when looking at the order it is from 2022 and that the appt should not have been scheduled, Ronnie wanted to know why, I again informed him that the order is 44 years old. Pt's husband become argumentation and call transferred to Dr. Amon. I have tried calling downstairs x3 to cancel that appt but no answer. I have made Kim aware and she will reach out to Veterinary surgeon.

## 2023-10-29 NOTE — Telephone Encounter (Signed)
 Copied from CRM #[id number]. Topic: Clinical - Request for Lab/Test Order >> Oct 29, 2023 12:45 PM [place] J wrote: Reason for CRM: Patient's husband is calling to request an order for a bone density test. Patient has osteoporosis and osteopenia and she has been off of medication for 3 years.   Please call [phone] with an update on this.

## 2023-10-29 NOTE — Telephone Encounter (Signed)
 Please advise- holiday for [place]  started 08/2021.

## 2023-10-29 NOTE — Telephone Encounter (Addendum)
 Managed by endocrinology at [facility], was recommended to see them again this year.  Recommend to reach out to them. Addendum: I spoke with the patient husband, he is adamant about me order a DEXA.  Again I recommend to reach to Endo, if they are not going to continue taking care of the problem, then she needs an appointment with me to get that scheduled.

## 2023-10-30 ENCOUNTER — Other Ambulatory Visit (HOSPITAL_BASED_OUTPATIENT_CLINIC_OR_DEPARTMENT_OTHER): Admitting: Radiology

## 2023-10-30 ENCOUNTER — Other Ambulatory Visit (HOSPITAL_BASED_OUTPATIENT_CLINIC_OR_DEPARTMENT_OTHER): Payer: Self-pay | Admitting: Endocrinology

## 2023-10-30 DIAGNOSIS — E559 Vitamin D deficiency, unspecified: Secondary | ICD-10-CM

## 2023-10-30 DIAGNOSIS — D649 Anemia, unspecified: Secondary | ICD-10-CM

## 2023-10-30 DIAGNOSIS — M810 Age-related osteoporosis without current pathological fracture: Secondary | ICD-10-CM

## 2023-10-30 DIAGNOSIS — Z0000 Encounter for general adult medical examination without abnormal findings: Secondary | ICD-10-CM

## 2023-10-30 NOTE — Telephone Encounter (Signed)
 Order changed to Dr Drena name

## 2023-11-01 ENCOUNTER — Other Ambulatory Visit (HOSPITAL_BASED_OUTPATIENT_CLINIC_OR_DEPARTMENT_OTHER): Admitting: Radiology

## 2023-11-02 ENCOUNTER — Ambulatory Visit (HOSPITAL_BASED_OUTPATIENT_CLINIC_OR_DEPARTMENT_OTHER)
Admission: RE | Admit: 2023-11-02 | Discharge: 2023-11-02 | Disposition: A | Discharge status: Home / Self Care | Source: Ambulatory Visit | Attending: Endocrinology | Admitting: Endocrinology

## 2023-11-02 DIAGNOSIS — M810 Age-related osteoporosis without current pathological fracture: Secondary | ICD-10-CM

## 2023-11-27 ENCOUNTER — Other Ambulatory Visit: Payer: Self-pay | Admitting: Urology

## 2023-11-27 DIAGNOSIS — R3129 Other microscopic hematuria: Secondary | ICD-10-CM

## 2023-12-03 ENCOUNTER — Ambulatory Visit
Admission: RE | Admit: 2023-12-03 | Discharge: 2023-12-03 | Disposition: A | Discharge status: Home / Self Care | Source: Ambulatory Visit | Attending: Urology | Admitting: Urology

## 2023-12-03 DIAGNOSIS — R3129 Other microscopic hematuria: Secondary | ICD-10-CM

## 2023-12-03 MED ORDER — IOPAMIDOL (ISOVUE-300) INJECTION 61%
150.0000 mL | Freq: Once | INTRAVENOUS | Status: AC | PRN
Start: 2023-12-03 — End: 2023-12-03
  Administered 2023-12-03: 150 mL via INTRAVENOUS

## 2023-12-14 ENCOUNTER — Encounter: Payer: Self-pay | Admitting: Internal Medicine

## 2023-12-14 ENCOUNTER — Ambulatory Visit: Attending: Internal Medicine | Admitting: Internal Medicine

## 2023-12-14 VITALS — BP 92/59 | HR 72 | Ht 60.0 in | Wt 118.0 lb

## 2023-12-14 DIAGNOSIS — K219 Gastro-esophageal reflux disease without esophagitis: Secondary | ICD-10-CM | POA: Diagnosis not present

## 2023-12-14 DIAGNOSIS — I700 Atherosclerosis of aorta: Secondary | ICD-10-CM | POA: Diagnosis not present

## 2023-12-14 DIAGNOSIS — I959 Hypotension, unspecified: Secondary | ICD-10-CM | POA: Diagnosis not present

## 2023-12-14 NOTE — Patient Instructions (Signed)
 Medication Instructions:  None *If you need a refill on your cardiac medications before your next appointment, please call your pharmacy*  Lab Work: None If you have labs (blood work) drawn today and your tests are completely normal, you will receive your results only by: MyChart Message (if you have MyChart) OR A paper copy in the mail If you have any lab test that is abnormal or we need to change your treatment, we will call you to review the results.  Testing/Procedures: None  Follow-Up: At [facility], you and your health needs are our priority.  As part of our continuing mission to provide you with exceptional heart care, our providers are all part of one team.  This team includes your primary Cardiologist (physician) and Advanced Practice Providers or APPs (Physician Assistants and Nurse Practitioners) who all work together to provide you with the care you need, when you need it.  Your next appointment:   As Needed  Provider:       We recommend signing up for the patient portal called MyChart.  Sign up information is provided on this After Visit Summary.  MyChart is used to connect with patients for Virtual Visits (Telemedicine).  Patients are able to view lab/test results, encounter notes, upcoming appointments, etc.  Non-urgent messages can be sent to your provider as well.   To learn more about what you can do with MyChart, go to [email or web address].   Other Instructions

## 2023-12-14 NOTE — Progress Notes (Signed)
 Cardiology Office Note:  .   Date:  12/14/2023  ID:  [name], DOB 1960-12-23, MRN [id number] PCP: Amon Aloysius BRAVO, MD  [facility] Providers Cardiologist:  None    History of Present Illness: .   [name] is a 63 y.o. female. Her husband has seen me before and provides the majority of the history and asks the majority of the questions. I offered interpreter services but patient states her husband will interpret for her.   Discussed the use of AI scribe software for clinical note transcription with the patient, who gave verbal consent to proceed.  History of Present Illness [name] is a 63 year old female with aortic atherosclerosis and hyperlipidemia who presents for follow up. No current chest pain.   She has recently undergone cholecystectomy. She has experienced chest pain with a cardiac calcium  score of zero in December 2022. She also recently had a normal stress echo by report on 12/10/23 at [facility]. I cannot indepedently review this study, but she had excellent exercise capacity of 11 METS.   She has hyperlipidemia, previously prescribed atorvastatin  40 mg daily and aspirin  81 mg daily. Due to suboptimal LDL control, atorvastatin  was increased to 80 mg daily, but she opted for dietary and lifestyle changes instead. However, she discloses today that she never took atorvastatin . Her recent lipid panel showed triglycerides at 75 and LDL at 102.  A CT scan in April 2022 revealed mild abdominal aortic calcification, prompting her initial cardiology referral. She has not been on statin therapy since then. Her family history includes dyslipidemia in her mother and brother, and similar issues in her father.  Her blood pressure is typically around 90/60, with a recent reading of 83/49. She is not on any blood pressure medications. She notes she could hydrate better.     ROS: negative except per HPI above.  Studies Reviewed: SABRA   EKG Interpretation Date/Time:  Friday  December 14 2023 08:22:15 EDT Ventricular Rate:  68 PR Interval:  162 QRS Duration:  76 QT Interval:  400 QTC Calculation: 425 R Axis:   7  Text Interpretation: Normal sinus rhythm Normal ECG Confirmed by Loni Rushing ([id number]) on 12/14/2023 8:26:06 AM    Results LABS Triglycerides: 75 (June 2025) LDL: 102 (June 2025)  RADIOLOGY CT abdomen and pelvis: Mild abdominal aortic calcification (April 2022) Cardiac calcium  score: Zero (December 2022)  DIAGNOSTIC Echocardiogram: Grossly normal Treadmill stress test: Normal (12/10/2023) Risk Assessment/Calculations:       Physical Exam:   VS:  BP (!) 92/59 (BP Location: Right Arm, Patient Position: Sitting, Cuff Size: Normal) Comment: 83/49 R  Pulse 72   Ht 5' (1.524 m)   Wt 118 lb (53.5 kg)   SpO2 99%   BMI 23.05 kg/m    Wt Readings from Last 3 Encounters:  12/14/23 118 lb (53.5 kg)  10/15/23 116 lb (52.6 kg)  01/10/23 114 lb 2 oz (51.8 kg)     Physical Exam VITALS: BP- 83/49 GENERAL: Alert, cooperative, well developed, no acute distress. HEENT: Normocephalic, normal oropharynx, moist mucous membranes. CHEST: Clear to auscultation bilaterally, no wheezes, rhonchi, or crackles. CARDIOVASCULAR: Normal heart rate and rhythm, S1 and S2 normal without murmurs. ABDOMEN: Soft, non-tender, non-distended, without organomegaly, normal bowel sounds. EXTREMITIES: No cyanosis or edema. NEUROLOGICAL: Cranial nerves grossly intact, moves all extremities without gross motor or sensory deficit.   ASSESSMENT AND PLAN: .    Assessment and Plan Assessment & Plan Atherosclerosis of aorta with hyperlipidemia  Atherosclerosis with hyperlipidemia noted on CT. Calcium  score zero in Dec 2022. Normal stress test. LDL 102, triglycerides 75. Family history of dyslipidemia. Atorvastatin  recommended but not started due to preference for lifestyle changes. Discussed statins' role in plaque stabilization and cardiovascular risk reduction. Emphasized  personal decision in consultation with primary care. - Discuss with primary care physician about starting atorvastatin . - Consider repeating calcium  score in 2027 (5 years from prior) if not on medication. - Encourage continuation of diet and lifestyle modifications. - we extensively reviewed secondary prevention of atherosclerosis and that as the indication for statin or lipid lowering therapy.   Hypotension Not on antihypertensives. Asymptomatic. - Increase fluid intake and add electrolyte drinks. - Monitor blood pressure and symptoms.  With normal coronary calcium  scoring, and recently normal stress echo, no further cardiology follow up is required in the absence of new symptoms. I have advised that she can discuss initiating statin with her PCP given continued hesitation from husband, and if they agree this can be started and followed by Dr. Amon after upcoming visit with lab work.   Recording duration: 22 minutes      Marge Vandermeulen, MD, FACC

## 2024-01-07 ENCOUNTER — Other Ambulatory Visit (INDEPENDENT_AMBULATORY_CARE_PROVIDER_SITE_OTHER)

## 2024-01-07 ENCOUNTER — Encounter: Payer: Self-pay | Admitting: Gastroenterology

## 2024-01-07 ENCOUNTER — Ambulatory Visit: Admitting: Gastroenterology

## 2024-01-07 VITALS — BP 100/60 | HR 67 | Ht 60.0 in | Wt 122.1 lb

## 2024-01-07 DIAGNOSIS — K219 Gastro-esophageal reflux disease without esophagitis: Secondary | ICD-10-CM

## 2024-01-07 DIAGNOSIS — R1013 Epigastric pain: Secondary | ICD-10-CM

## 2024-01-07 LAB — COMPREHENSIVE METABOLIC PANEL WITH GFR
ALT: 17 U/L (ref 0–35)
AST: 16 U/L (ref 0–37)
Albumin: 3.9 g/dL (ref 3.5–5.2)
Alkaline Phosphatase: 85 U/L (ref 39–117)
BUN: 13 mg/dL (ref 6–23)
CO2: 28 meq/L (ref 19–32)
Calcium: 8.5 mg/dL (ref 8.4–10.5)
Chloride: 107 meq/L (ref 96–112)
Creatinine, Ser: 0.55 mg/dL (ref 0.40–1.20)
GFR: 97.32 mL/min (ref >60.00)
Glucose, Bld: 85 mg/dL (ref 70–99)
Potassium: 3.8 meq/L (ref 3.5–5.1)
Sodium: 140 meq/L (ref 135–145)
Total Bilirubin: 0.6 mg/dL (ref 0.2–1.2)
Total Protein: 6.7 g/dL (ref 6.0–8.3)

## 2024-01-07 LAB — LIPASE: Lipase: 22 U/L (ref 11.0–59.0)

## 2024-01-07 LAB — CBC WITH DIFFERENTIAL/PLATELET
Basophils Absolute: 0.1 K/uL (ref 0.0–0.1)
Basophils Relative: 1.4 % (ref 0.0–3.0)
Eosinophils Absolute: 0.4 K/uL (ref 0.0–0.7)
Eosinophils Relative: 8 % — ABNORMAL HIGH (ref 0.0–5.0)
HCT: 38 % (ref 36.0–46.0)
Hemoglobin: 12.6 g/dL (ref 12.0–15.0)
Lymphocytes Relative: 31.3 % (ref 12.0–46.0)
Lymphs Abs: 1.6 K/uL (ref 0.7–4.0)
MCHC: 33.1 g/dL (ref 30.0–36.0)
MCV: 91.8 fl (ref 78.0–100.0)
Monocytes Absolute: 0.4 K/uL (ref 0.1–1.0)
Monocytes Relative: 7.2 % (ref 3.0–12.0)
Neutro Abs: 2.7 K/uL (ref 1.4–7.7)
Neutrophils Relative %: 52.1 % (ref 43.0–77.0)
Platelets: 233 K/uL (ref 150.0–400.0)
RBC: 4.14 Mil/uL (ref 3.87–5.11)
RDW: 13.8 % (ref 11.5–15.5)
WBC: 5.2 K/uL (ref 4.0–10.5)

## 2024-01-07 NOTE — Progress Notes (Signed)
 Chief Complaint: to get established  Referring Provider:  Amon Aloysius BRAVO, MD      ASSESSMENT AND PLAN;   #1. GERD/ epi pain  #2. Acute cholecystitis s/p lap chole with IOC 10/16/2023. One incision still with minor drainage.  #3. H/O polyps. Next colon due 12/2026  Plan: -CBC, CMP, lipase -Continue pepcid  prn -Has appt with wound care for draining incision   HPI:     History of Present Illness [name] is a 64 year old female who presents with post-cholecystectomy symptoms and ongoing acid reflux. Very minimal pain  Mainly here to get established since Dr. Aneita retired and Dr. Eda left.  She underwent a cholecystectomy on October 16, 2023, due to severe gallbladder pain and a large gallstone. Prior to the surgery, she experienced multiple episodes of intense pain, nausea, and difficulty breathing, initially thought to be related to acid reflux. An ultrasound revealed a large gallstone prior to her surgery. Post-surgery, the pain has resolved, but she continues to experience burping and occasional diarrhea.  She has a history of acid reflux, initially suspected to be the cause of her symptoms. She takes Pepcid  (famotidine ) 20 mg as needed for reflux symptoms, which have improved since the gallbladder removal. She describes the reflux as 'better' and only takes medication when necessary.  Post-surgery, she has been experiencing drainage from one of the surgical incisions, persistent for nearly three months. The drainage is described as yellow, and she changes the dressing daily. Silver nitrate was applied to the incision, but it did not reduce the drainage. She is scheduled to see wound care for further evaluation.  Her past medical history includes a positive H. pylori test in May 2025, treated with antibiotics. She also underwent a colonoscopy in 2021, where one precancerous polyp was removed. Her family history includes her mother having gallbladder issues that led to  pancreatitis.    Past GI WU: EGD 11/2020: - Normal esophagus. Biopsied. - Erosive gastropathy with no bleeding and no stigmata of recent bleeding. Biopsied. - Normal examined duodenum. Biopsied.  Colon 12/2019 ([facility]) - One 8 mm polyp in the cecum, removed with a cold snare. Resected and retrieved. - Mild diverticulosis in the entire examined colon. - The examination was otherwise normal on direct and retroflexion views. -Rpt colon 12/2026  Jul 09, 2023: + HP breath test, treated with Quad therapy  Past Medical History:  Diagnosis Date   Adenomatous colon polyp    Allergy    Asthma 1990's   GERD (gastroesophageal reflux disease)    Headache(784.0)    Palpitations    Pneumonia    Tuberculosis 1990's    Past Surgical History:  Procedure Laterality Date   APPENDECTOMY  2022   CHOLECYSTECTOMY N/A 10/16/2023   Procedure: LAPAROSCOPIC CHOLECYSTECTOMY WITH INTRAOPERATIVE CHOLANGIOGRAM;  Surgeon: Lyndel Deward PARAS, MD;  Location: [facility] OR;  Service: General;  Laterality: N/A;   CHOLECYSTECTOMY  2025   COLONOSCOPY  08/31/2009   COLONOSCOPY  12/12/2019   COLPOSCOPY  04/28/2016   LAPAROSCOPIC APPENDECTOMY N/A 06/18/2020   Procedure: APPENDECTOMY LAPAROSCOPIC;  Surgeon: Paola Dreama SAILOR, MD;  Location: [facility] OR;  Service: General;  Laterality: N/A;   OVARIAN CYST REMOVAL      Family History  Problem Relation Age of Onset   Hypertension Mother    High Cholesterol Mother    Atrial fibrillation Father        pacemaker   Other Father        blood clotting disorder  Hypertension Brother    Hyperlipidemia Brother    Diabetes Paternal Grandfather    Heart disease Other    Hypertension Other    Hyperlipidemia Other    Hypertension Other    Colon cancer Neg Hx    Breast cancer Neg Hx    Colon polyps Neg Hx     Social History   Tobacco Use   Smoking status: Never   Smokeless tobacco: Never  Vaping Use   Vaping status: Never Used  Substance Use Topics   Alcohol use: Yes     Comment: occasional wine   Drug use: No    Current Outpatient Medications  Medication Sig Dispense Refill   acetaminophen  (TYLENOL ) 500 MG tablet Take 2 tablets (1,000 mg total) by mouth every 8 (eight) hours as needed.     alendronate (FOSAMAX) 70 MG tablet Take 70 mg by mouth once a week.     celecoxib (CELEBREX) 200 MG capsule Take 200 mg by mouth daily as needed for mild pain (pain score 1-3).     cholecalciferol (VITAMIN D3) 25 MCG (1000 UNIT) tablet Take 1,000 Units by mouth daily.     estradiol (ESTRACE) 0.1 MG/GM vaginal cream      famotidine  (PEPCID ) 20 MG tablet Take 1 tablet (20 mg total) by mouth 2 (two) times daily. 60 tablet 1   promethazine -dextromethorphan (PROMETHAZINE -DM) 6.25-15 MG/5ML syrup Take 5 mLs by mouth every 8 (eight) hours as needed for cough. 180 mL 0   vitamin B-12 (CYANOCOBALAMIN) 1000 MCG tablet Take 1,000 mcg by mouth daily.     No current facility-administered medications for this visit.    Allergies  Allergen Reactions   Ciprofloxacin  Other (See Comments)   Clindamycin Diarrhea   Gabapentin     Caused headaches and dizziness    Review of Systems:  Constitutional: Denies fever, chills, diaphoresis, appetite change and fatigue.  HEENT: Denies photophobia, eye pain, redness, hearing loss, ear pain, congestion, sore throat, rhinorrhea, sneezing, mouth sores, neck pain, neck stiffness and tinnitus.   Respiratory: Denies SOB, DOE, cough, chest tightness,  and wheezing.   Cardiovascular: Denies chest pain, palpitations and leg swelling.  Genitourinary: Denies dysuria, urgency, frequency, hematuria, flank pain and difficulty urinating.  Musculoskeletal: Denies myalgias, back pain, joint swelling, arthralgias and gait problem.  Skin: No rash.  Neurological: Denies dizziness, seizures, syncope, weakness, light-headedness, numbness and headaches.  Hematological: Denies adenopathy. Easy bruising, personal or family bleeding history  Psychiatric/Behavioral:  No anxiety or depression     Physical Exam:    BP 100/60   Pulse 67   Ht 5' (1.524 m)   Wt 122 lb 2 oz (55.4 kg)   BMI 23.85 kg/m  Wt Readings from Last 3 Encounters:  01/07/24 122 lb 2 oz (55.4 kg)  12/14/23 118 lb (53.5 kg)  10/15/23 116 lb (52.6 kg)   Constitutional:  Well-developed, in no acute distress. Psychiatric: Normal mood and affect. Behavior is normal. HEENT: Pupils normal.  Conjunctivae are normal. No scleral icterus. Cardiovascular: Normal rate, regular rhythm. No edema Pulmonary/chest: Effort normal and breath sounds normal. No wheezing, rales or rhonchi. Abdominal: Soft, nondistended. Nontender. Bowel sounds active throughout. There are no masses palpable. No hepatomegaly. Rectal: Deferred Neurological: Alert and oriented to person place and time. Skin: Skin is warm and dry. No rashes noted.  Data Reviewed: I have personally reviewed following labs and imaging studies  CBC:    Latest Ref Rng & Units 10/16/2023    6:15 AM 10/15/2023  8:30 PM 10/15/2023   11:45 AM  CBC  WBC 4.0 - 10.5 K/uL 5.4  7.6  7.7   Hemoglobin 12.0 - 15.0 g/dL 88.2  88.1  87.2   Hematocrit 36.0 - 46.0 % 35.9  36.1  38.7   Platelets 150 - 400 K/uL 189  193  229     CMP:    Latest Ref Rng & Units 10/16/2023    6:15 AM 10/15/2023    8:30 PM 10/15/2023   11:45 AM  CMP  Glucose 70 - 99 mg/dL 92   897   BUN 8 - 23 mg/dL 7   11   Creatinine 9.55 - 1.00 mg/dL 9.48  9.49  9.52   Sodium 135 - 145 mmol/L 138   141   Potassium 3.5 - 5.1 mmol/L 4.4   3.7   Chloride 98 - 111 mmol/L 107   106   CO2 22 - 32 mmol/L 24   22   Calcium  8.9 - 10.3 mg/dL 8.6   8.9   Total Protein 6.5 - 8.1 g/dL   6.8   Total Bilirubin 0.0 - 1.2 mg/dL   0.9   Alkaline Phos 38 - 126 U/L   147   AST 15 - 41 U/L   46   ALT 0 - 44 U/L   246         Anselm Bring, MD 01/07/2024, 1:46 PM  Cc: Amon Aloysius BRAVO, MD

## 2024-01-07 NOTE — Patient Instructions (Addendum)
 _______________________________________________________  If your blood pressure at your visit was 140/90 or greater, please contact your primary care physician to follow up on this.  _______________________________________________________  If you are age 63 or older, your body mass index should be between 23-30. Your Body mass index is 23.85 kg/m. If this is out of the aforementioned range listed, please consider follow up with your Primary Care Provider.  If you are age 75 or younger, your body mass index should be between 19-25. Your Body mass index is 23.85 kg/m. If this is out of the aformentioned range listed, please consider follow up with your Primary Care Provider.   ________________________________________________________  The [facility] providers would like to encourage you to use MYCHART to communicate with providers for non-urgent requests or questions.  Due to long hold times on the telephone, sending your provider a message by [facility] may be a faster and more efficient way to get a response.  Please allow 48 business hours for a response.  Please remember that this is for non-urgent requests.  _______________________________________________________  [facility] is using a team-based approach to care.  Your team is made up of your doctor and two to three APPS. Our APPS (Nurse Practitioners and Physician Assistants) work with your physician to ensure care continuity for you. They are fully qualified to address your health concerns and develop a treatment plan. They communicate directly with your gastroenterologist to care for you. Seeing the Advanced Practice Practitioners on your physician's team can help you by facilitating care more promptly, often allowing for earlier appointments, access to diagnostic testing, procedures, and other specialty referrals.   Your provider has requested that you go to the basement level for lab work before leaving today. Press B on the  elevator. The lab is located at the first door on the left as you exit the elevator.  Thank you,  Dr. Lynnie Bring

## 2024-01-09 ENCOUNTER — Ambulatory Visit: Payer: Self-pay | Admitting: Gastroenterology

## 2024-01-09 ENCOUNTER — Other Ambulatory Visit: Payer: Self-pay | Admitting: Ophthalmology

## 2024-01-11 ENCOUNTER — Encounter (HOSPITAL_BASED_OUTPATIENT_CLINIC_OR_DEPARTMENT_OTHER): Attending: Internal Medicine | Admitting: Internal Medicine

## 2024-01-11 DIAGNOSIS — T8131XA Disruption of external operation (surgical) wound, not elsewhere classified, initial encounter: Secondary | ICD-10-CM

## 2024-01-11 DIAGNOSIS — K810 Acute cholecystitis: Secondary | ICD-10-CM | POA: Diagnosis not present

## 2024-01-11 LAB — SURGICAL PATHOLOGY

## 2024-01-18 ENCOUNTER — Telehealth: Payer: Self-pay | Admitting: *Deleted

## 2024-01-18 ENCOUNTER — Encounter: Payer: 59 | Admitting: Internal Medicine

## 2024-01-18 NOTE — Telephone Encounter (Signed)
 Pt has appt with [place] on 11/25.

## 2024-01-18 NOTE — Telephone Encounter (Signed)
 Copied from CRM #[id number]. Topic: Clinical - Request for Lab/Test Order >> Jan 18, 2024  2:28 PM Harlene ORN wrote: Reason for CRM: Patient is requesting to have lab orders activated Please call back the spouse Tanda: [phone]  Requesting fasting labs.

## 2024-01-22 NOTE — Telephone Encounter (Signed)
 [name] LABOR   01/22/2024  3:43 PM  Patient's husband called in to check in to see if orders were placed in the chart just yet. I advised they were not but he just wants to make sure it is fasting/lipid panels, iron, b12, and vitamin D . I advised to give a call back tomorrow because he stated he would call back before 5 today but I advised to allow time for physician to put orders in chart and that we would call to schedule the appointment because he was seeing if he could schedule next Tuesday 11/25 for labs but I advised orders need to be added in first.    [phone] (W)

## 2024-01-22 NOTE — Telephone Encounter (Signed)
 Copied from CRM #[id number]. Topic: Clinical - Request for Lab/Test Order >> Jan 22, 2024 10:28 AM Viola FALCON wrote: Reason for CRM: Patient has physical appt scheduled for 02/04/24 - she needs labs order.SABRA also would b12, vitamin d  and iron. Please call spouse Lyndy [phone] [facility])

## 2024-01-23 ENCOUNTER — Telehealth: Payer: Self-pay | Admitting: Medical

## 2024-01-23 ENCOUNTER — Telehealth: Payer: Self-pay

## 2024-01-23 DIAGNOSIS — E559 Vitamin D deficiency, unspecified: Secondary | ICD-10-CM

## 2024-01-23 DIAGNOSIS — Z0000 Encounter for general adult medical examination without abnormal findings: Secondary | ICD-10-CM

## 2024-01-23 DIAGNOSIS — D649 Anemia, unspecified: Secondary | ICD-10-CM

## 2024-01-23 DIAGNOSIS — E538 Deficiency of other specified B group vitamins: Secondary | ICD-10-CM

## 2024-01-23 NOTE — Telephone Encounter (Signed)
 Appt for 02/04/24 is for cpe. For the extra labs they are requesting patient does have b12 deficiency and anemia.  She does not have medicare.

## 2024-01-23 NOTE — Telephone Encounter (Signed)
 Copied from CRM #[id number]. Topic: Clinical - Request for Lab/Test Order >> Jan 22, 2024 10:28 AM [name] wrote: Reason for CRM: Patient has physical appt scheduled for 02/04/24 - she needs labs order.[name] also would b12, vitamin d  and iron. Please call spouse [name] [phone] [facility]) >> Jan 23, 2024  9:01 AM [name] wrote: Patients husband following up on messages sent regarding lab orders, wants to have physical labs done before appointment on 12/1. Okay to leave voicemail  [name] [phone] >> Jan 22, 2024  3:43 PM [name] wrote: Patient's husband called in to check in to see if orders were placed in the chart just yet. I advised they were not but he just wants to make sure it is fasting/lipid panels, iron, b12, and vitamin D . I advised to give a call back tomorrow because he stated he would call back before 5 today but I advised to allow time for physician to put orders in chart and that we would call to schedule the appointment because he was seeing if he could schedule next Tuesday 11/25 for labs but I advised orders need to be added in first.   [phone] (W)

## 2024-01-23 NOTE — Telephone Encounter (Signed)
 Future labs placed for pt before day of wellness.

## 2024-01-24 NOTE — Telephone Encounter (Signed)
 Pt scheduled

## 2024-01-28 NOTE — Telephone Encounter (Signed)
 Labs placed.

## 2024-01-29 ENCOUNTER — Other Ambulatory Visit

## 2024-01-29 ENCOUNTER — Encounter: Admitting: Medical

## 2024-01-29 ENCOUNTER — Ambulatory Visit: Payer: Self-pay | Admitting: Medical

## 2024-01-29 DIAGNOSIS — D649 Anemia, unspecified: Secondary | ICD-10-CM | POA: Diagnosis not present

## 2024-01-29 DIAGNOSIS — Z0000 Encounter for general adult medical examination without abnormal findings: Secondary | ICD-10-CM

## 2024-01-29 DIAGNOSIS — E538 Deficiency of other specified B group vitamins: Secondary | ICD-10-CM | POA: Diagnosis not present

## 2024-01-29 DIAGNOSIS — E559 Vitamin D deficiency, unspecified: Secondary | ICD-10-CM

## 2024-01-29 LAB — CBC WITH DIFFERENTIAL/PLATELET
Basophils Absolute: 0.1 K/uL (ref 0.0–0.1)
Basophils Relative: 1.4 % (ref 0.0–3.0)
Eosinophils Absolute: 0.5 K/uL (ref 0.0–0.7)
Eosinophils Relative: 9.9 % — ABNORMAL HIGH (ref 0.0–5.0)
HCT: 39 % (ref 36.0–46.0)
Hemoglobin: 13 g/dL (ref 12.0–15.0)
Lymphocytes Relative: 41.7 % (ref 12.0–46.0)
Lymphs Abs: 2 K/uL (ref 0.7–4.0)
MCHC: 33.3 g/dL (ref 30.0–36.0)
MCV: 92.4 fl (ref 78.0–100.0)
Monocytes Absolute: 0.2 K/uL (ref 0.1–1.0)
Monocytes Relative: 4.9 % (ref 3.0–12.0)
Neutro Abs: 2 K/uL (ref 1.4–7.7)
Neutrophils Relative %: 42.1 % — ABNORMAL LOW (ref 43.0–77.0)
Platelets: 226 K/uL (ref 150.0–400.0)
RBC: 4.23 Mil/uL (ref 3.87–5.11)
RDW: 14 % (ref 11.5–15.5)
WBC: 4.8 K/uL (ref 4.0–10.5)

## 2024-01-29 LAB — LIPID PANEL
Cholesterol: 233 mg/dL — ABNORMAL HIGH (ref 0–200)
HDL: 69.2 mg/dL (ref >39.00)
LDL Cholesterol: 133 mg/dL — ABNORMAL HIGH (ref 0–99)
NonHDL: 163.38
Total CHOL/HDL Ratio: 3
Triglycerides: 151 mg/dL — ABNORMAL HIGH (ref 0.0–149.0)
VLDL: 30.2 mg/dL (ref 0.0–40.0)

## 2024-01-29 LAB — IRON,TIBC AND FERRITIN PANEL
%SAT: 12 % — ABNORMAL LOW (ref 16–45)
Ferritin: 16 ng/mL (ref 16–288)
Iron: 42 ug/dL — ABNORMAL LOW (ref 45–160)
TIBC: 338 ug/dL (ref 250–450)

## 2024-01-29 LAB — VITAMIN B12: Vitamin B-12: 256 pg/mL (ref 211–911)

## 2024-01-29 LAB — COMPREHENSIVE METABOLIC PANEL WITH GFR
ALT: 14 U/L (ref 0–35)
AST: 17 U/L (ref 0–37)
Albumin: 4.1 g/dL (ref 3.5–5.2)
Alkaline Phosphatase: 93 U/L (ref 39–117)
BUN: 15 mg/dL (ref 6–23)
CO2: 31 meq/L (ref 19–32)
Calcium: 9.1 mg/dL (ref 8.4–10.5)
Chloride: 107 meq/L (ref 96–112)
Creatinine, Ser: 0.49 mg/dL (ref 0.40–1.20)
GFR: 100.02 mL/min (ref >60.00)
Glucose, Bld: 87 mg/dL (ref 70–99)
Potassium: 4.5 meq/L (ref 3.5–5.1)
Sodium: 140 meq/L (ref 135–145)
Total Bilirubin: 0.4 mg/dL (ref 0.2–1.2)
Total Protein: 6.7 g/dL (ref 6.0–8.3)

## 2024-01-29 LAB — VITAMIN D 25 HYDROXY (VIT D DEFICIENCY, FRACTURES): VITD: 27.39 ng/mL — ABNORMAL LOW (ref 30.00–100.00)

## 2024-02-03 ENCOUNTER — Other Ambulatory Visit: Payer: Self-pay | Admitting: Obstetrics and Gynecology

## 2024-02-03 DIAGNOSIS — N840 Polyp of corpus uteri: Secondary | ICD-10-CM

## 2024-02-04 ENCOUNTER — Ambulatory Visit (INDEPENDENT_AMBULATORY_CARE_PROVIDER_SITE_OTHER): Admitting: Medical

## 2024-02-04 VITALS — BP 110/62 | HR 71 | Temp 98.4°F | Resp 15 | Ht 60.0 in | Wt 120.8 lb

## 2024-02-04 DIAGNOSIS — F439 Reaction to severe stress, unspecified: Secondary | ICD-10-CM

## 2024-02-04 DIAGNOSIS — E611 Iron deficiency: Secondary | ICD-10-CM | POA: Diagnosis not present

## 2024-02-04 DIAGNOSIS — E785 Hyperlipidemia, unspecified: Secondary | ICD-10-CM | POA: Diagnosis not present

## 2024-02-04 DIAGNOSIS — Z0000 Encounter for general adult medical examination without abnormal findings: Secondary | ICD-10-CM | POA: Diagnosis not present

## 2024-02-04 DIAGNOSIS — E559 Vitamin D deficiency, unspecified: Secondary | ICD-10-CM | POA: Diagnosis not present

## 2024-02-04 DIAGNOSIS — E538 Deficiency of other specified B group vitamins: Secondary | ICD-10-CM | POA: Diagnosis not present

## 2024-02-04 MED ORDER — FERROUS SULFATE 325 (65 FE) MG PO TABS: ORAL_TABLET | ORAL | 1 refills | Status: AC

## 2024-02-04 MED ORDER — VITAMIN D (ERGOCALCIFEROL) 1.25 MG (50000 UNIT) PO CAPS: 50000.0000 [IU] | ORAL_CAPSULE | ORAL | 0 refills | Status: DC

## 2024-02-04 NOTE — Progress Notes (Signed)
 Subjective:    Patient ID: [name], female    DOB: 11-Dec-1960, 63 y.o.   MRN: [id number]  HPI  Pt in wellness exam.   [employer] just closed at work site. Pt does walk a lot. She states strict low cholesterol diet as well. Non smoker. Wine-1 glass of wine every 2 weeks.   Pt up to date on mammogram. Last pap looks like done 2023.   Acute stress as [employer] where she works just closed down all locations and she was planning to work there for at least another year.   High cholesterol- pt surprised by high cholesterol.  Low vit d level- pt has been in vit d in the past. Hx of gallbladder taken out.    B12 lower end normal.-  hx of low b12. She takes 1000 mcg daily. Advise to increase b12 2000 mcg daily.   No anemia. Lower end ferritin level. Mld low iron.       The 10-year ASCVD risk score (Arnett DK, et al., 2019) is: 3.3%   Values used to calculate the score:     Age: 36 years     Clincally relevant sex: Female     Is Non-Hispanic African American: No     Diabetic: No     Tobacco smoker: No     Systolic Blood Pressure: 110 mmHg     Is BP treated: No     HDL Cholesterol: 69.2 mg/dL     Total Cholesterol: 233 mg/dL      Review of Systems  Constitutional:  Negative for chills.  HENT:  Negative for dental problem, ear pain and postnasal drip.   Respiratory:  Negative for cough and wheezing.   Cardiovascular:  Negative for chest pain and palpitations.  Gastrointestinal:  Negative for abdominal pain, constipation and vomiting.  Genitourinary:  Negative for decreased urine volume.  Musculoskeletal:  Negative for back pain.  Skin:  Negative for rash.  Neurological:  Negative for dizziness, weakness and light-headedness.  Psychiatric/Behavioral:  Negative for behavioral problems and suicidal ideas. The patient is not hyperactive.        Stress.    Past Medical History:  Diagnosis Date   Adenomatous colon polyp    Allergy    Asthma 1990's   GERD  (gastroesophageal reflux disease)    Headache(784.0)    Palpitations    Pneumonia    Tuberculosis 1990's     Social History   Socioeconomic History   Marital status: Married    Spouse name: Not on file   Number of children: 0   Years of education: Not on file   Highest education level: Bachelor's degree (e.g., BA, AB, BS)  Occupational History   Occupation: conservation officer, nature [employer]  Tobacco Use   Smoking status: Never   Smokeless tobacco: Never  Vaping Use   Vaping status: Never Used  Substance and Sexual Activity   Alcohol use: Yes    Comment: occasional wine   Drug use: No   Sexual activity: Not on file  Other Topics Concern   Not on file  Social History Narrative   From [place]   G0P0   Social Drivers of Health   Financial Resource Strain: Low Risk  (01/28/2024)   Overall Financial Resource Strain (CARDIA)    Difficulty of Paying Living Expenses: Not very hard  Food Insecurity: Unknown (01/28/2024)   Hunger Vital Sign    Worried About Running Out of Food in the Last Year:  Never true    Ran Out of Food in the Last Year: Not on file  Transportation Needs: No Transportation Needs (01/28/2024)   PRAPARE - Administrator, Civil Service (Medical): No    Lack of Transportation (Non-Medical): No  Physical Activity: Sufficiently Active (01/28/2024)   Exercise Vital Sign    Days of Exercise per Week: 5 days    Minutes of Exercise per Session: 100 min  Stress: Stress Concern Present (01/28/2024)   [employer] of Occupational Health - Occupational Stress Questionnaire    Feeling of Stress: To some extent  Social Connections: Moderately Isolated (01/28/2024)   Social Connection and Isolation Panel    Frequency of Communication with Friends and Family: More than three times a week    Frequency of Social Gatherings with Friends and Family: Never    Attends Religious Services: Never    Database Administrator or Organizations: No    Attends Hospital Doctor: Not on file    Marital Status: Married  Catering Manager Violence: Not At Risk (10/16/2023)   Humiliation, Afraid, Rape, and Kick questionnaire    Fear of Current or Ex-Partner: No    Emotionally Abused: No    Physically Abused: No    Sexually Abused: No    Past Surgical History:  Procedure Laterality Date   APPENDECTOMY  2022   CHOLECYSTECTOMY N/A 10/16/2023   Procedure: LAPAROSCOPIC CHOLECYSTECTOMY WITH INTRAOPERATIVE CHOLANGIOGRAM;  Surgeon: Lyndel Deward PARAS, MD;  Location: [facility] OR;  Service: General;  Laterality: N/A;   CHOLECYSTECTOMY  2025   COLONOSCOPY  08/31/2009   COLONOSCOPY  12/12/2019   COLPOSCOPY  04/28/2016   LAPAROSCOPIC APPENDECTOMY N/A 06/18/2020   Procedure: APPENDECTOMY LAPAROSCOPIC;  Surgeon: Paola Dreama SAILOR, MD;  Location: [facility] OR;  Service: General;  Laterality: N/A;   OVARIAN CYST REMOVAL      Family History  Problem Relation Age of Onset   Hypertension Mother    High Cholesterol Mother    Atrial fibrillation Father        pacemaker   Other Father        blood clotting disorder   Hypertension Brother    Hyperlipidemia Brother    Diabetes Paternal Grandfather    Heart disease Other    Hypertension Other    Hyperlipidemia Other    Hypertension Other    Colon cancer Neg Hx    Breast cancer Neg Hx    Colon polyps Neg Hx     Allergies  Allergen Reactions   Ciprofloxacin  Other (See Comments)   Clindamycin Diarrhea   Gabapentin     Caused headaches and dizziness   Wound Dressing Adhesive     Dermabond Surgical Adhesive    Current Outpatient Medications on File Prior to Visit  Medication Sig Dispense Refill   acetaminophen  (TYLENOL ) 500 MG tablet Take 2 tablets (1,000 mg total) by mouth every 8 (eight) hours as needed.     alendronate (FOSAMAX) 70 MG tablet Take 70 mg by mouth once a week.     celecoxib (CELEBREX) 200 MG capsule Take 200 mg by mouth daily as needed for mild pain (pain score 1-3).     cholecalciferol (VITAMIN D3) 25  MCG (1000 UNIT) tablet Take 1,000 Units by mouth daily.     estradiol (ESTRACE) 0.1 MG/GM vaginal cream      famotidine  (PEPCID ) 20 MG tablet Take 1 tablet (20 mg total) by mouth 2 (two) times daily. 60 tablet 1   promethazine -dextromethorphan (PROMETHAZINE -DM)  6.25-15 MG/5ML syrup Take 5 mLs by mouth every 8 (eight) hours as needed for cough. 180 mL 0   vitamin B-12 (CYANOCOBALAMIN) 1000 MCG tablet Take 1,000 mcg by mouth daily.     No current facility-administered medications on file prior to visit.    BP 110/62   Pulse 71   Temp 98.4 F (36.9 C) (Oral)   Resp 15   Ht 5' (1.524 m)   Wt 120 lb 12.8 oz (54.8 kg)   SpO2 99%   BMI 23.59 kg/m        Objective:   Physical Exam  General Mental Status- Alert. General Appearance- Not in acute distress.   Skin General: Color- Normal Color. Moisture- Normal Moisture.  Neck Carotid Arteries- Normal color. Moisture- Normal Moisture. No carotid bruits. No JVD.  Chest and Lung Exam Auscultation: Breath Sounds:-Normal.  Cardiovascular Auscultation:Rythm- Regular. Murmurs & Other Heart Sounds:Auscultation of the heart reveals- No Murmurs.  Abdomen Inspection:-Inspeection Normal. Palpation/Percussion:Note:No mass. Palpation and Percussion of the abdomen reveal- Non Tender, Non Distended + BS, no rebound or guarding.   Neurologic Cranial Nerve exam:- CN III-XII intact(No nystagmus), symmetric smile. Strength:- 5/5 equal and symmetric strength both upper and lower extremities.       Assessment & Plan:   Patient Instructions  For you wellness exam today I have ordered cbc, cmp, iron panel, b12, vit d and  lipid panel reviewed today  Vaccine given today. Reviewed vaccines. Discussed pcv 20, shingrix vaccine and flu vaccine(vaccines delayed later date)  Recommend exercise and healthy diet.  We will let you know lab results as they come in.  Follow up date appointment will be determined after lab review.      Hyperlipidemia, unspecified hyperlipidemia type Next week repeat per your requst - Lipid panel; Future -repeat level fasting next weeks   Vitamin D  deficiency -weekly 50,000 units and repeat level in 9 weeks -Vit level repeat in 9 weeks.   Low iron -Low dose iron 325 mg every other day - CBC w/Diff; Future - Iron, TIBC and Ferritin Panel; Future (9 weeks)     B12- 2000 mcg daily dose.  Stress with recent job loss. Mild- moderate depression and anxiety score related to pt job loss. -consider ssri type med if mood worsens   D[place] Maxwell, PA-C    [id number] charge as did address high cholesterol, low ferritin, lower b12 and vit d deficiency(answered various question on patient conditions)

## 2024-02-04 NOTE — Patient Instructions (Addendum)
 For you wellness exam today I have ordered cbc, cmp, iron panel, b12, vit d and  lipid panel reviewed today  Vaccine given today. Reviewed vaccines. Discussed pcv 20, shingrix vaccine and flu vaccine(vaccines delayed later date)  Recommend exercise and healthy diet.  We will let you know lab results as they come in.  Follow up date appointment will be determined after lab review.     Hyperlipidemia, unspecified hyperlipidemia type Next week repeat per your request - Lipid panel; Future -repeat level fasting next weeks   Vitamin D  deficiency -weekly 50,000 units and repeat level in 9 weeks -Vit level repeat in 9 weeks.   Low iron -Low dose iron 325 mg every other day  B12 deficiency -2000 mcg daily dose.  Stress with recent job loss. Mild- moderate depression and anxiety score related to pt job loss. -consider ssri type med if mood worsens   Preventive Care 58-29 Years Old, Female Preventive care refers to lifestyle choices and visits with your health care provider that can promote health and wellness. Preventive care visits are also called wellness exams. What can I expect for my preventive care visit? Counseling Your health care provider may ask you questions about your: Medical history, including: Past medical problems. Family medical history. Pregnancy history. Current health, including: Menstrual cycle. Method of birth control. Emotional well-being. Home life and relationship well-being. Sexual activity and sexual health. Lifestyle, including: Alcohol, nicotine or tobacco, and drug use. Access to firearms. Diet, exercise, and sleep habits. Work and work astronomer. Sunscreen use. Safety issues such as seatbelt and bike helmet use. Physical exam Your health care provider will check your: Height and weight. These may be used to calculate your BMI (body mass index). BMI is a measurement that tells if you are at a healthy weight. Waist circumference. This  measures the distance around your waistline. This measurement also tells if you are at a healthy weight and may help predict your risk of certain diseases, such as type 2 diabetes and high blood pressure. Heart rate and blood pressure. Body temperature. Skin for abnormal spots. What immunizations do I need?  Vaccines are usually given at various ages, according to a schedule. Your health care provider will recommend vaccines for you based on your age, medical history, and lifestyle or other factors, such as travel or where you work. What tests do I need? Screening Your health care provider may recommend screening tests for certain conditions. This may include: Lipid and cholesterol levels. Diabetes screening. This is done by checking your blood sugar (glucose) after you have not eaten for a while (fasting). Pelvic exam and Pap test. Hepatitis B test. Hepatitis C test. HIV (human immunodeficiency virus) test. STI (sexually transmitted infection) testing, if you are at risk. Lung cancer screening. Colorectal cancer screening. Mammogram. Talk with your health care provider about when you should start having regular mammograms. This may depend on whether you have a family history of breast cancer. BRCA-related cancer screening. This may be done if you have a family history of breast, ovarian, tubal, or peritoneal cancers. Bone density scan. This is done to screen for osteoporosis. Talk with your health care provider about your test results, treatment options, and if necessary, the need for more tests. Follow these instructions at home: Eating and drinking  Eat a diet that includes fresh fruits and vegetables, whole grains, lean protein, and low-fat dairy products. Take vitamin and mineral supplements as recommended by your health care provider. Do not drink alcohol if: Your  health care provider tells you not to drink. You are pregnant, may be pregnant, or are planning to become  pregnant. If you drink alcohol: Limit how much you have to 0-1 drink a day. Know how much alcohol is in your drink. In the [place] one drink equals one 12 oz bottle of beer (355 mL), one 5 oz glass of wine (148 mL), or one 1 oz glass of hard liquor (44 mL). Lifestyle Brush your teeth every morning and night with fluoride toothpaste. Floss one time each day. Exercise for at least 30 minutes 5 or more days each week. Do not use any products that contain nicotine or tobacco. These products include cigarettes, chewing tobacco, and vaping devices, such as e-cigarettes. If you need help quitting, ask your health care provider. Do not use drugs. If you are sexually active, practice safe sex. Use a condom or other form of protection to prevent STIs. If you do not wish to become pregnant, use a form of birth control. If you plan to become pregnant, see your health care provider for a prepregnancy visit. Take aspirin  only as told by your health care provider. Make sure that you understand how much to take and what form to take. Work with your health care provider to find out whether it is safe and beneficial for you to take aspirin  daily. Find healthy ways to manage stress, such as: Meditation, yoga, or listening to music. Journaling. Talking to a trusted person. Spending time with friends and family. Minimize exposure to UV radiation to reduce your risk of skin cancer. Safety Always wear your seat belt while driving or riding in a vehicle. Do not drive: If you have been drinking alcohol. Do not ride with someone who has been drinking. When you are tired or distracted. While texting. If you have been using any mind-altering substances or drugs. Wear a helmet and other protective equipment during sports activities. If you have firearms in your house, make sure you follow all gun safety procedures. Seek help if you have been physically or sexually abused. What's next? Visit your health care provider  once a year for an annual wellness visit. Ask your health care provider how often you should have your eyes and teeth checked. Stay up to date on all vaccines. This information is not intended to replace advice given to you by your health care provider. Make sure you discuss any questions you have with your health care provider. Document Revised: 08/18/2020 Document Reviewed: 08/18/2020 Elsevier Patient Education  2024 Elsevier Inc.   - CBC w/Diff; Future - Iron, TIBC and Ferritin Panel; Future (9 weeks)

## 2024-02-06 ENCOUNTER — Encounter (HOSPITAL_COMMUNITY): Payer: Self-pay

## 2024-02-07 NOTE — Telephone Encounter (Signed)
 Pt called to stating wife would like to come in And get the attachments only , and then maybe in a couple months, if she can get, her insurance back, because her insurance ends this month

## 2024-02-08 NOTE — Progress Notes (Signed)
 Spoke w/ via phone for pre-op interview--- [name] needs dos----  NONE       [name] results------Current EKG in Epic dated 12/14/23 COVID test -----patient states asymptomatic no test needed Arrive at -------1200 NPO after MN NO Solid Food.  Clear liquids from MN until---1100 Pre-Surgery Ensure or G2:  Med rec completed Medications to take morning of surgery -----NONE Diabetic medication -----  GLP1 agonist last dose: GLP1 instructions:  Patient instructed no nail polish to be worn day of surgery Patient instructed to bring photo id and insurance card day of surgery Patient aware to have Driver (ride ) / caregiver    for 24 hours after surgery - Unsure, unable to reach pt.  Patient Special Instructions ----- Pre-Op special Instructions -----  Patient verbalized understanding of instructions that were given at this phone interview. Patient denies chest pain, sob, fever, cough at the interview.

## 2024-02-11 ENCOUNTER — Ambulatory Visit (HOSPITAL_COMMUNITY)
Admission: RE | Admit: 2024-02-11 | Discharge: 2024-02-11 | Disposition: A | Attending: Obstetrics and Gynecology | Admitting: Obstetrics and Gynecology

## 2024-02-11 ENCOUNTER — Other Ambulatory Visit: Payer: Self-pay

## 2024-02-11 ENCOUNTER — Ambulatory Visit (HOSPITAL_COMMUNITY): Admitting: Anesthesiology

## 2024-02-11 ENCOUNTER — Encounter (HOSPITAL_COMMUNITY): Admission: RE | Disposition: A | Payer: Self-pay | Source: Home / Self Care | Attending: Obstetrics and Gynecology

## 2024-02-11 ENCOUNTER — Encounter (HOSPITAL_COMMUNITY): Payer: Self-pay | Admitting: Obstetrics and Gynecology

## 2024-02-11 DIAGNOSIS — N840 Polyp of corpus uteri: Secondary | ICD-10-CM

## 2024-02-11 DIAGNOSIS — Z01818 Encounter for other preprocedural examination: Secondary | ICD-10-CM

## 2024-02-11 HISTORY — PX: DILATATION & CURRETTAGE/HYSTEROSCOPY WITH RESECTOCOPE: SHX5572

## 2024-02-11 LAB — CBC
HCT: 41.5 % (ref 36.0–46.0)
Hemoglobin: 13.5 g/dL (ref 12.0–15.0)
MCH: 30.3 pg (ref 26.0–34.0)
MCHC: 32.5 g/dL (ref 30.0–36.0)
MCV: 93.3 fL (ref 80.0–100.0)
Platelets: 257 K/uL (ref 150–400)
RBC: 4.45 MIL/uL (ref 3.87–5.11)
RDW: 13.3 % (ref 11.5–15.5)
WBC: 4.8 K/uL (ref 4.0–10.5)
nRBC: 0 % (ref 0.0–0.2)

## 2024-02-11 SURGERY — DILATATION & CURETTAGE/HYSTEROSCOPY WITH RESECTOCOPE
Anesthesia: General | Site: Vagina

## 2024-02-11 MED ORDER — MIDAZOLAM HCL 2 MG/2ML IJ SOLN
INTRAMUSCULAR | Status: AC
  Filled 2024-02-11: qty 2

## 2024-02-11 MED ORDER — LIDOCAINE 2% (20 MG/ML) 5 ML SYRINGE
INTRAMUSCULAR | Status: DC | PRN
  Administered 2024-02-11: 60 mg via INTRAVENOUS

## 2024-02-11 MED ORDER — OXYCODONE HCL 5 MG PO TABS: 5.0000 mg | ORAL_TABLET | Freq: Once | ORAL | Status: DC | PRN

## 2024-02-11 MED ORDER — FENTANYL CITRATE (PF) 250 MCG/5ML IJ SOLN
INTRAMUSCULAR | Status: DC | PRN
  Administered 2024-02-11 (×2): 25 ug via INTRAVENOUS
  Administered 2024-02-11: 50 ug via INTRAVENOUS

## 2024-02-11 MED ORDER — ACETAMINOPHEN 500 MG PO TABS
1000.0000 mg | ORAL_TABLET | Freq: Once | ORAL | Status: AC
  Administered 2024-02-11: 1000 mg via ORAL

## 2024-02-11 MED ORDER — CHLORHEXIDINE GLUCONATE 0.12 % MT SOLN
15.0000 mL | Freq: Once | OROMUCOSAL | Status: AC
  Administered 2024-02-11: 15 mL via OROMUCOSAL

## 2024-02-11 MED ORDER — LIDOCAINE HCL 1 % IJ SOLN
INTRAMUSCULAR | Status: DC | PRN
  Administered 2024-02-11: 9 mL

## 2024-02-11 MED ORDER — DEXAMETHASONE SOD PHOSPHATE PF 10 MG/ML IJ SOLN
INTRAMUSCULAR | Status: DC | PRN
  Administered 2024-02-11: 6 mg via INTRAVENOUS

## 2024-02-11 MED ORDER — OXYCODONE HCL 5 MG PO TABS: 5.0000 mg | ORAL_TABLET | Freq: Four times a day (QID) | ORAL | 0 refills | Status: AC | PRN

## 2024-02-11 MED ORDER — AMISULPRIDE (ANTIEMETIC) 5 MG/2ML IV SOLN: 10.0000 mg | Freq: Once | INTRAVENOUS | Status: DC | PRN

## 2024-02-11 MED ORDER — EPHEDRINE SULFATE-NACL 50-0.9 MG/10ML-% IV SOSY
PREFILLED_SYRINGE | INTRAVENOUS | Status: DC | PRN
  Administered 2024-02-11 (×2): 5 mg via INTRAVENOUS
  Administered 2024-02-11: 2.5 mg via INTRAVENOUS

## 2024-02-11 MED ORDER — OXYCODONE HCL 5 MG/5ML PO SOLN: 5.0000 mg | Freq: Once | ORAL | Status: DC | PRN

## 2024-02-11 MED ORDER — FENTANYL CITRATE (PF) 100 MCG/2ML IJ SOLN: 25.0000 ug | INTRAMUSCULAR | Status: DC | PRN

## 2024-02-11 MED ORDER — SODIUM CHLORIDE 0.9 % IR SOLN
Status: DC | PRN
  Administered 2024-02-11: 3000 mL

## 2024-02-11 MED ORDER — MIDAZOLAM HCL (PF) 2 MG/2ML IJ SOLN
INTRAMUSCULAR | Status: DC | PRN
  Administered 2024-02-11: 1 mg via INTRAVENOUS

## 2024-02-11 MED ORDER — ACETAMINOPHEN 500 MG PO TABS
ORAL_TABLET | ORAL | Status: DC
  Filled 2024-02-11: qty 2

## 2024-02-11 MED ORDER — IBUPROFEN 800 MG PO TABS: 800.0000 mg | ORAL_TABLET | Freq: Three times a day (TID) | ORAL | 0 refills | Status: AC | PRN

## 2024-02-11 MED ORDER — FENTANYL CITRATE (PF) 100 MCG/2ML IJ SOLN
INTRAMUSCULAR | Status: AC
  Filled 2024-02-11: qty 2

## 2024-02-11 MED ORDER — ORAL CARE MOUTH RINSE: 15.0000 mL | Freq: Once | OROMUCOSAL | Status: AC

## 2024-02-11 MED ORDER — LIDOCAINE HCL 1 % IJ SOLN
INTRAMUSCULAR | Status: AC
  Filled 2024-02-11: qty 20

## 2024-02-11 MED ORDER — POVIDONE-IODINE 10 % EX SWAB: 2.0000 | Freq: Once | CUTANEOUS | Status: DC

## 2024-02-11 MED ORDER — CHLORHEXIDINE GLUCONATE 0.12 % MT SOLN
OROMUCOSAL | Status: DC
  Filled 2024-02-11: qty 15

## 2024-02-11 MED ORDER — PROPOFOL 10 MG/ML IV BOLUS
INTRAVENOUS | Status: DC | PRN
  Administered 2024-02-11: 100 mg via INTRAVENOUS
  Administered 2024-02-11: 100 ug/kg/min via INTRAVENOUS

## 2024-02-11 MED ORDER — LACTATED RINGERS IV SOLN: INTRAVENOUS | Status: DC

## 2024-02-11 MED ORDER — ONDANSETRON HCL 4 MG/2ML IJ SOLN
INTRAMUSCULAR | Status: DC | PRN
  Administered 2024-02-11: 4 mg via INTRAVENOUS

## 2024-02-11 SURGICAL SUPPLY — 16 items
CATH ROBINSON RED A/P 16FR (CATHETERS) IMPLANT
DILATOR CANAL MILEX (MISCELLANEOUS) IMPLANT
GLOVE BIO SURGEON STRL SZ7 (GLOVE) ×2 IMPLANT
GLOVE BIOGEL PI IND STRL 7.0 (GLOVE) IMPLANT
GLOVE BIOGEL PI IND STRL 7.5 (GLOVE) IMPLANT
GLOVE BIOGEL PI MICRO STRL 7 (GLOVE) IMPLANT
GLOVE SURG UNDER POLY LF SZ7 (GLOVE) ×2 IMPLANT
GOWN STRL REUS W/ TWL LRG LVL3 (GOWN DISPOSABLE) ×2 IMPLANT
KIT PROCED FLUENT PRO FLT212S (KITS) ×2 IMPLANT
KIT TURNOVER KIT B (KITS) ×2 IMPLANT
PACK VAGINAL MINOR WOMEN LF (CUSTOM PROCEDURE TRAY) ×2 IMPLANT
PAD OB MATERNITY 11 LF (PERSONAL CARE ITEMS) ×2 IMPLANT
SEAL ROD LENS SCOPE MYOSURE (ABLATOR) ×2 IMPLANT
SOL .9 NS 3000ML IRR UROMATIC (IV SOLUTION) IMPLANT
TOWEL GREEN STERILE FF (TOWEL DISPOSABLE) ×2 IMPLANT
UNDERPAD 30X36 HEAVY ABSORB (UNDERPADS AND DIAPERS) ×2 IMPLANT

## 2024-02-11 NOTE — Op Note (Signed)
 Pre-Operative Diagnosis: 1) Thickened endometrium Postoperative Diagnosis: 1) Thickened endometrium 2) endometrial polyp 3) cervical stenosis Procedure: Hysteroscopy, dilation and curettage Surgeon: Dr. [name] Assistant: None Operative Findings: Nulliparous appearing cervix with cervical stenosis.  Small endometrial cavity with impression of uterine septum.  Endometrial polyp. Specimen: Endometrial curettings and polyp EBL: Minimal  [name] Is a 63 year old female surgical management for incidentally found thickened endometrium on a CT scan. Please see the patient's history and physical for complete details of the history. Management options were discussed with the patient. R/B/A reviewed. Following appropriate informed consent was taken to the operating room. The patient was appropriately identified during a time out procedure.  General anesthesia was administered and the patient was placed in the dorsal lithotomy position. The patient was prepped and draped in the normal sterile fashion. A speculum was placed into the vagina, a single-tooth tenaculum was placed on the anterior lip of the cervix, and 10 cc of 1% lidocaine  was administered in a paracervical fashion. The cervix was serially dilated with Hank dilators.  The hysteroscope was introduced for the above findings.  Due to cervical stenosis the operative scope could not be used and it diagnostic hysteroscope was used to visualize the endometrial cavity.  The endometrial cavity appeared small and gave the impression of a uterine septum, however there was some tracking of the dilators into the patient's right cornua which may have erroneously given the impression of uterine septum.  The hysteroscope was removed and a sharp curettage was performed.  This completed the surgical case.  The patient tolerated the procedure well and was transferred to the recovery room in stable condition following the procedure.  All sponge, instrument, needle  counts were correct.

## 2024-02-11 NOTE — Anesthesia Preprocedure Evaluation (Addendum)
 Anesthesia Evaluation  Patient identified by MRN, date of birth, ID band Patient awake    Reviewed: Allergy & Precautions, NPO status , Patient's Chart, lab work & pertinent test results  Airway Mallampati: II  TM Distance: >3 FB Neck ROM: Full    Dental no notable dental hx. (+) Implants   Pulmonary asthma    Pulmonary exam normal breath sounds clear to auscultation       Cardiovascular negative cardio ROS Normal cardiovascular exam Rhythm:Regular Rate:Normal  TTE 2022 1. Left ventricular ejection fraction, by estimation, is 65 to 70%. Left  ventricular ejection fraction by 3D volume is 67 %. The left ventricle has  normal function. The left ventricle has no regional wall motion  abnormalities. Left ventricular diastolic   parameters were normal.   2. Right ventricular systolic function is normal. The right ventricular  size is normal. Tricuspid regurgitation signal is inadequate for assessing  PA pressure.   3. The mitral valve is normal in structure. Trivial mitral valve  regurgitation.   4. The aortic valve is tricuspid. There is mild thickening of the aortic  valve. Aortic valve regurgitation is not visualized. Aortic valve  sclerosis is present, with no evidence of aortic valve stenosis.   5. The inferior vena cava is normal in size with greater than 50%  respiratory variability, suggesting right atrial pressure of 3 mmHg.     Neuro/Psych  Headaches PSYCHIATRIC DISORDERS Anxiety        GI/Hepatic Neg liver ROS,GERD  ,,  Endo/Other  negative endocrine ROS    Renal/GU negative Renal ROS  negative genitourinary   Musculoskeletal negative musculoskeletal ROS (+)    Abdominal   Peds  Hematology negative hematology ROS (+)   Anesthesia Other Findings   Reproductive/Obstetrics                              Anesthesia Physical Anesthesia Plan  ASA: 2  Anesthesia Plan: General    Post-op Pain Management: Tylenol  PO (pre-op)*   Induction: Intravenous  PONV Risk Score and Plan: 3 and Ondansetron , Dexamethasone , Midazolam , TIVA and Propofol  infusion  Airway Management Planned: LMA  Additional Equipment:   Intra-op Plan:   Post-operative Plan: Extubation in OR  Informed Consent: I have reviewed the patients History and Physical, chart, labs and discussed the procedure including the risks, benefits and alternatives for the proposed anesthesia with the patient or authorized representative who has indicated his/her understanding and acceptance.     Dental advisory given  Plan Discussed with: CRNA  Anesthesia Plan Comments: (LMA TIVA)         Anesthesia Quick Evaluation

## 2024-02-11 NOTE — Anesthesia Procedure Notes (Signed)
 Procedure Name: LMA Insertion Date/Time: 02/11/2024 1:54 PM  Performed by: Vertie Arthea RAMAN, CRNAPre-anesthesia Checklist: Patient identified, Emergency Drugs available, Suction available and Patient being monitored Patient Re-evaluated:Patient Re-evaluated prior to induction Oxygen Delivery Method: Circle System Utilized Preoxygenation: Pre-oxygenation with 100% oxygen Induction Type: IV induction Ventilation: Mask ventilation without difficulty LMA: LMA inserted LMA Size: 4.0 Number of attempts: 1 Airway Equipment and Method: Bite block Placement Confirmation: positive ETCO2 Tube secured with: Tape Dental Injury: Teeth and Oropharynx as per pre-operative assessment

## 2024-02-11 NOTE — H&P (Signed)
 [name] is an 63 y.o. female.   63 year old patient presents for hysteroscopy D&C for evaluation of thickened endometrium.  Patient had incidental finding of thickened endometrium noted on a CT scan of the abdomen and pelvis when she was being evaluated for cholelithiasis.  A pelvic ultrasound was performed in our office on 01/07/2024.  Results listed below  Indication: Thickened endometrium incidentally noted on CT scan of abdomen and pelvis TA US : Limited visualization of pelvic anatomy by abdominal route.  Proceeding to vaginal imaging was required for more detailed evaluation of pelvic anatomy. TVUS: Slightly retroverted uterus with anteflexed fundus.  Endometrial thickness in aggregate measures 1.73 mm.  Endometrial cavity has hypoechoic fluid.  Within the endometrial cavity there is a 5 mm x 3 mm x 7 mm structure.  No increased Doppler signal noted.  Cervix normal-appearing.  Adnexa not visualized bilaterally.  No free fluid in the pelvis. Impression: Endometrial cavity with hypoechoic fluid and possible endometrial polyp.  Management options discussed with the patient.  Recommended hysteroscopy with D&C for evaluation of the uterine cavity and tissue sampling.  Risks/benefits/alternatives reviewed.  Patient wishes to proceed.  No LMP recorded. Patient is postmenopausal.    Past Medical History:  Diagnosis Date   Adenomatous colon polyp    Allergy    Asthma 1990's   GERD (gastroesophageal reflux disease)    Headache(784.0)    Palpitations    Pneumonia    Tuberculosis 1990's    Past Surgical History:  Procedure Laterality Date   APPENDECTOMY  2022   CHOLECYSTECTOMY N/A 10/16/2023   Procedure: LAPAROSCOPIC CHOLECYSTECTOMY WITH INTRAOPERATIVE CHOLANGIOGRAM;  Surgeon: Lyndel Deward PARAS, MD;  Location: [facility] OR;  Service: General;  Laterality: N/A;   CHOLECYSTECTOMY  2025   COLONOSCOPY  08/31/2009   COLONOSCOPY  12/12/2019   COLPOSCOPY  04/28/2016   LAPAROSCOPIC  APPENDECTOMY N/A 06/18/2020   Procedure: APPENDECTOMY LAPAROSCOPIC;  Surgeon: Paola Dreama SAILOR, MD;  Location: [facility] OR;  Service: General;  Laterality: N/A;   OVARIAN CYST REMOVAL      Family History  Problem Relation Age of Onset   Hypertension Mother    High Cholesterol Mother    Atrial fibrillation Father        pacemaker   Other Father        blood clotting disorder   Hypertension Brother    Hyperlipidemia Brother    Diabetes Paternal Grandfather    Heart disease Other    Hypertension Other    Hyperlipidemia Other    Hypertension Other    Colon cancer Neg Hx    Breast cancer Neg Hx    Colon polyps Neg Hx     Social History:  reports that she has never smoked. She has never used smokeless tobacco. She reports current alcohol use. She reports that she does not use drugs.  Allergies:  Allergies  Allergen Reactions   Ciprofloxacin  Other (See Comments)    Pt denies   Clindamycin Diarrhea   Gabapentin     Caused headaches and dizziness   Wound Dressing Adhesive     Dermabond Surgical Adhesive    Medications Prior to Admission  Medication Sig Dispense Refill Last Dose/Taking   acetaminophen  (TYLENOL ) 500 MG tablet Take 2 tablets (1,000 mg total) by mouth every 8 (eight) hours as needed.      alendronate (FOSAMAX) 70 MG tablet Take 70 mg by mouth once a week.      celecoxib (CELEBREX) 200 MG capsule Take 200 mg by mouth daily  as needed for mild pain (pain score 1-3).      cholecalciferol (VITAMIN D3) 25 MCG (1000 UNIT) tablet Take 1,000 Units by mouth daily.      estradiol (ESTRACE) 0.1 MG/GM vaginal cream       famotidine  (PEPCID ) 20 MG tablet Take 1 tablet (20 mg total) by mouth 2 (two) times daily. 60 tablet 1    ferrous sulfate  325 (65 FE) MG tablet 1 tab po every other day 30 tablet 1    promethazine -dextromethorphan (PROMETHAZINE -DM) 6.25-15 MG/5ML syrup Take 5 mLs by mouth every 8 (eight) hours as needed for cough. 180 mL 0    vitamin B-12 (CYANOCOBALAMIN ) 1000 MCG  tablet Take 1,000 mcg by mouth daily.      Vitamin D , Ergocalciferol , (DRISDOL ) 1.25 MG (50000 UNIT) CAPS capsule Take 1 capsule (50,000 Units total) by mouth every 7 (seven) days. 8 capsule 0     Review of Systems  Blood pressure 102/70, pulse 77, temperature 98.5 F (36.9 C), temperature source Oral, resp. rate 17, height 5' (1.524 m), weight 52.6 kg, SpO2 98%. Physical Exam Alert and orient x 3, no apparent distress Normal work of breathing Abdomen soft  No results found for this or any previous visit (from the past 24 hours).  No results found.  Assessment/Plan: 1) proceed with hysteroscopy, dilation and curettage, and possible polypectomy. 2) SCDs for DVT prophylaxis.   [name] 02/11/2024, 12:31 PM

## 2024-02-11 NOTE — Anesthesia Postprocedure Evaluation (Signed)
 Anesthesia Post Note  Patient: [name]  Procedure(s) Performed: DILATATION & CURETTAGE/HYSTEROSCOPY (Vagina )     Patient location during evaluation: PACU Anesthesia Type: General Level of consciousness: awake and alert Pain management: pain level controlled Vital Signs Assessment: post-procedure vital signs reviewed and stable Respiratory status: spontaneous breathing, nonlabored ventilation, respiratory function stable and patient connected to nasal cannula oxygen Cardiovascular status: blood pressure returned to baseline and stable Postop Assessment: no apparent nausea or vomiting Anesthetic complications: no   No notable events documented.  Last Vitals:  Vitals:   02/11/24 1448 02/11/24 1500  BP: (!) 100/58 117/60  Pulse: 70 61  Resp: 10 15  Temp: 36.4 C   SpO2: 99% 99%    Last Pain:  Vitals:   02/11/24 1448  TempSrc:   PainSc: 0-No pain                 Garnette DELENA Gab

## 2024-02-11 NOTE — Transfer of Care (Signed)
 Immediate Anesthesia Transfer of Care Note  Patient: [name]  Procedure(s) Performed: DILATATION & CURETTAGE/HYSTEROSCOPY (Vagina )  Patient Location: PACU  Anesthesia Type:General  Level of Consciousness: awake and alert   Airway & Oxygen Therapy: Patient Spontanous Breathing  Post-op Assessment: Report given to RN  Post vital signs: Reviewed and stable  Last Vitals:  Vitals Value Taken Time  BP 96/49 02/11/24 14:48  Temp 36.4 C 02/11/24 14:48  Pulse 67 02/11/24 14:52  Resp 21 02/11/24 14:52  SpO2 96 % 02/11/24 14:52  Vitals shown include unfiled device data.  Last Pain:  Vitals:   02/11/24 1219  TempSrc: Oral  PainSc: 0-No pain         Complications: No notable events documented.

## 2024-02-12 ENCOUNTER — Encounter: Payer: Self-pay | Admitting: Medical

## 2024-02-12 ENCOUNTER — Encounter (HOSPITAL_COMMUNITY): Payer: Self-pay | Admitting: Obstetrics and Gynecology

## 2024-02-13 LAB — SURGICAL PATHOLOGY

## 2024-02-15 ENCOUNTER — Other Ambulatory Visit (INDEPENDENT_AMBULATORY_CARE_PROVIDER_SITE_OTHER)

## 2024-02-15 DIAGNOSIS — E785 Hyperlipidemia, unspecified: Secondary | ICD-10-CM | POA: Diagnosis not present

## 2024-02-15 DIAGNOSIS — E611 Iron deficiency: Secondary | ICD-10-CM | POA: Diagnosis not present

## 2024-02-15 LAB — LIPID PANEL
Cholesterol: 221 mg/dL — ABNORMAL HIGH (ref 0–200)
HDL: 68.7 mg/dL (ref >39.00)
LDL Cholesterol: 124 mg/dL — ABNORMAL HIGH (ref 0–99)
NonHDL: 152.22
Total CHOL/HDL Ratio: 3
Triglycerides: 142 mg/dL (ref 0.0–149.0)
VLDL: 28.4 mg/dL (ref 0.0–40.0)

## 2024-02-15 LAB — CBC WITH DIFFERENTIAL/PLATELET
Basophils Absolute: 0.1 K/uL (ref 0.0–0.1)
Basophils Relative: 1.3 % (ref 0.0–3.0)
Eosinophils Absolute: 0.3 K/uL (ref 0.0–0.7)
Eosinophils Relative: 6.4 % — ABNORMAL HIGH (ref 0.0–5.0)
HCT: 37.8 % (ref 36.0–46.0)
Hemoglobin: 12.7 g/dL (ref 12.0–15.0)
Lymphocytes Relative: 34.9 % (ref 12.0–46.0)
Lymphs Abs: 1.7 K/uL (ref 0.7–4.0)
MCHC: 33.7 g/dL (ref 30.0–36.0)
MCV: 91.7 fl (ref 78.0–100.0)
Monocytes Absolute: 0.3 K/uL (ref 0.1–1.0)
Monocytes Relative: 6.5 % (ref 3.0–12.0)
Neutro Abs: 2.5 K/uL (ref 1.4–7.7)
Neutrophils Relative %: 50.9 % (ref 43.0–77.0)
Platelets: 219 K/uL (ref 150.0–400.0)
RBC: 4.13 Mil/uL (ref 3.87–5.11)
RDW: 13.8 % (ref 11.5–15.5)
WBC: 4.9 K/uL (ref 4.0–10.5)

## 2024-02-16 LAB — IRON,TIBC AND FERRITIN PANEL
%SAT: 19 % (ref 16–45)
Ferritin: 11 ng/mL — ABNORMAL LOW (ref 16–288)
Iron: 66 ug/dL (ref 45–160)
TIBC: 354 ug/dL (ref 250–450)

## 2024-02-17 ENCOUNTER — Ambulatory Visit: Payer: Self-pay | Admitting: Medical

## 2024-02-17 MED ORDER — IRON (FERROUS SULFATE) 325 (65 FE) MG PO TABS: 325.0000 mg | ORAL_TABLET | Freq: Every day | ORAL | 1 refills | Status: AC

## 2024-02-17 NOTE — Addendum Note (Signed)
 Addended by: DORINA DALLAS HERO on: 02/17/2024 08:50 AM   Modules accepted: Orders

## 2024-04-02 ENCOUNTER — Other Ambulatory Visit: Payer: Self-pay | Admitting: Medical

## 2024-04-11 ENCOUNTER — Encounter: Admitting: Internal Medicine
